# Patient Record
Sex: Male | Born: 1975
Health system: Southern US, Community
[De-identification: ages and names within clinical notes are randomized; demographics above are authoritative.]

## PROBLEM LIST (undated history)

## (undated) DIAGNOSIS — F1010 Alcohol abuse, uncomplicated: Secondary | ICD-10-CM

## (undated) DIAGNOSIS — I10 Essential (primary) hypertension: Secondary | ICD-10-CM

## (undated) DIAGNOSIS — F329 Major depressive disorder, single episode, unspecified: Secondary | ICD-10-CM

## (undated) DIAGNOSIS — F209 Schizophrenia, unspecified: Secondary | ICD-10-CM

## (undated) DIAGNOSIS — R569 Unspecified convulsions: Secondary | ICD-10-CM

## (undated) DIAGNOSIS — G43909 Migraine, unspecified, not intractable, without status migrainosus: Secondary | ICD-10-CM

## (undated) DIAGNOSIS — F32A Depression, unspecified: Secondary | ICD-10-CM

## (undated) HISTORY — DX: Depression, unspecified: F32.A

## (undated) HISTORY — DX: Essential (primary) hypertension: I10

## (undated) HISTORY — DX: Migraine, unspecified, not intractable, without status migrainosus: G43.909

## (undated) HISTORY — PX: WISDOM TOOTH EXTRACTION: SHX21

## (undated) HISTORY — DX: Major depressive disorder, single episode, unspecified: F32.9

## (undated) HISTORY — DX: Alcohol abuse, uncomplicated: F10.10

---

## 1997-08-16 ENCOUNTER — Encounter: Admission: RE | Admit: 1997-08-16 | Discharge: 1997-08-16 | Payer: Self-pay | Admitting: Family Medicine

## 1997-08-31 ENCOUNTER — Encounter: Admission: RE | Admit: 1997-08-31 | Discharge: 1997-08-31 | Payer: Self-pay | Admitting: Family Medicine

## 1997-11-17 ENCOUNTER — Encounter: Admission: RE | Admit: 1997-11-17 | Discharge: 1997-11-17 | Payer: Self-pay | Admitting: Internal Medicine

## 1997-11-21 ENCOUNTER — Encounter: Admission: RE | Admit: 1997-11-21 | Discharge: 1997-11-21 | Payer: Self-pay | Admitting: Family Medicine

## 1999-12-27 ENCOUNTER — Encounter: Admission: RE | Admit: 1999-12-27 | Discharge: 1999-12-27 | Payer: Self-pay | Admitting: Family Medicine

## 2000-10-04 ENCOUNTER — Emergency Department (HOSPITAL_COMMUNITY): Admission: EM | Admit: 2000-10-04 | Discharge: 2000-10-04 | Payer: Self-pay | Admitting: Emergency Medicine

## 2001-01-07 ENCOUNTER — Emergency Department (HOSPITAL_COMMUNITY): Admission: EM | Admit: 2001-01-07 | Discharge: 2001-01-08 | Payer: Self-pay | Admitting: Emergency Medicine

## 2001-01-15 ENCOUNTER — Encounter: Admission: RE | Admit: 2001-01-15 | Discharge: 2001-01-15 | Payer: Self-pay | Admitting: Family Medicine

## 2001-04-29 ENCOUNTER — Encounter: Admission: RE | Admit: 2001-04-29 | Discharge: 2001-04-29 | Payer: Self-pay | Admitting: Family Medicine

## 2001-05-06 ENCOUNTER — Encounter: Admission: RE | Admit: 2001-05-06 | Discharge: 2001-05-06 | Payer: Self-pay | Admitting: Family Medicine

## 2001-07-10 ENCOUNTER — Encounter: Payer: Self-pay | Admitting: *Deleted

## 2001-07-10 ENCOUNTER — Emergency Department (HOSPITAL_COMMUNITY): Admission: EM | Admit: 2001-07-10 | Discharge: 2001-07-10 | Payer: Self-pay | Admitting: *Deleted

## 2003-04-29 ENCOUNTER — Emergency Department (HOSPITAL_COMMUNITY): Admission: AD | Admit: 2003-04-29 | Discharge: 2003-04-29 | Payer: Self-pay | Admitting: Family Medicine

## 2003-05-16 ENCOUNTER — Encounter: Admission: RE | Admit: 2003-05-16 | Discharge: 2003-05-16 | Payer: Self-pay | Admitting: Family Medicine

## 2003-06-03 ENCOUNTER — Emergency Department (HOSPITAL_COMMUNITY): Admission: AD | Admit: 2003-06-03 | Discharge: 2003-06-03 | Payer: Self-pay | Admitting: Family Medicine

## 2003-07-21 ENCOUNTER — Emergency Department (HOSPITAL_COMMUNITY): Admission: EM | Admit: 2003-07-21 | Discharge: 2003-07-21 | Payer: Self-pay | Admitting: Family Medicine

## 2003-08-29 ENCOUNTER — Emergency Department (HOSPITAL_COMMUNITY): Admission: EM | Admit: 2003-08-29 | Discharge: 2003-08-29 | Payer: Self-pay | Admitting: Family Medicine

## 2004-11-02 ENCOUNTER — Emergency Department (HOSPITAL_COMMUNITY): Admission: EM | Admit: 2004-11-02 | Discharge: 2004-11-02 | Payer: Self-pay | Admitting: Emergency Medicine

## 2005-07-09 ENCOUNTER — Emergency Department (HOSPITAL_COMMUNITY): Admission: EM | Admit: 2005-07-09 | Discharge: 2005-07-09 | Payer: Self-pay | Admitting: Family Medicine

## 2006-05-29 DIAGNOSIS — F17200 Nicotine dependence, unspecified, uncomplicated: Secondary | ICD-10-CM | POA: Insufficient documentation

## 2006-05-29 DIAGNOSIS — F23 Brief psychotic disorder: Secondary | ICD-10-CM | POA: Insufficient documentation

## 2011-09-27 ENCOUNTER — Encounter (HOSPITAL_COMMUNITY): Payer: Self-pay | Admitting: Emergency Medicine

## 2011-09-27 DIAGNOSIS — F17200 Nicotine dependence, unspecified, uncomplicated: Secondary | ICD-10-CM | POA: Insufficient documentation

## 2011-09-27 DIAGNOSIS — K648 Other hemorrhoids: Secondary | ICD-10-CM | POA: Insufficient documentation

## 2011-09-27 DIAGNOSIS — K922 Gastrointestinal hemorrhage, unspecified: Secondary | ICD-10-CM | POA: Insufficient documentation

## 2011-09-27 LAB — CBC WITH DIFFERENTIAL/PLATELET
Basophils Absolute: 0 10*3/uL (ref 0.0–0.1)
Basophils Relative: 0 % (ref 0–1)
Eosinophils Absolute: 0.1 10*3/uL (ref 0.0–0.7)
Eosinophils Relative: 1 % (ref 0–5)
HCT: 45.2 % (ref 39.0–52.0)
Hemoglobin: 15.7 g/dL (ref 13.0–17.0)
Lymphocytes Relative: 49 % — ABNORMAL HIGH (ref 12–46)
Lymphs Abs: 3.6 10*3/uL (ref 0.7–4.0)
MCH: 29.8 pg (ref 26.0–34.0)
MCHC: 34.7 g/dL (ref 30.0–36.0)
MCV: 85.8 fL (ref 78.0–100.0)
Monocytes Absolute: 0.7 10*3/uL (ref 0.1–1.0)
Monocytes Relative: 9 % (ref 3–12)
Neutro Abs: 3 10*3/uL (ref 1.7–7.7)
Neutrophils Relative %: 41 % — ABNORMAL LOW (ref 43–77)
Platelets: 219 10*3/uL (ref 150–400)
RBC: 5.27 MIL/uL (ref 4.22–5.81)
RDW: 12.4 % (ref 11.5–15.5)
WBC: 7.3 10*3/uL (ref 4.0–10.5)

## 2011-09-27 LAB — BASIC METABOLIC PANEL
BUN: 8 mg/dL (ref 6–23)
CO2: 29 mEq/L (ref 19–32)
Calcium: 9.7 mg/dL (ref 8.4–10.5)
Chloride: 100 mEq/L (ref 96–112)
Creatinine, Ser: 1.15 mg/dL (ref 0.50–1.35)
GFR calc Af Amer: >90 mL/min (ref >90)
GFR calc non Af Amer: 80 mL/min — ABNORMAL LOW (ref >90)
Glucose, Bld: 101 mg/dL — ABNORMAL HIGH (ref 70–99)
Potassium: 3.8 mEq/L (ref 3.5–5.1)
Sodium: 139 mEq/L (ref 135–145)

## 2011-09-27 LAB — SAMPLE TO BLOOD BANK

## 2011-09-27 NOTE — ED Notes (Signed)
Updated in w/r 

## 2011-09-27 NOTE — ED Notes (Signed)
PT. REPORTS BLOODY STOOLS FOR 4 WEEKS , DENIES INJURY , NO NAUSEA OR VOMITTING .

## 2011-09-28 ENCOUNTER — Emergency Department (HOSPITAL_COMMUNITY)
Admission: EM | Admit: 2011-09-28 | Discharge: 2011-09-28 | Disposition: A | Discharge status: Home / Self Care | Payer: Medicaid Other | Attending: Emergency Medicine | Admitting: Emergency Medicine

## 2011-09-28 ENCOUNTER — Emergency Department (HOSPITAL_COMMUNITY): Payer: Medicaid Other

## 2011-09-28 DIAGNOSIS — K922 Gastrointestinal hemorrhage, unspecified: Secondary | ICD-10-CM

## 2011-09-28 MED ORDER — DOCUSATE SODIUM 100 MG PO CAPS: 100.0000 mg | ORAL_CAPSULE | Freq: Two times a day (BID) | ORAL | Status: AC

## 2011-09-28 NOTE — Discharge Instructions (Signed)
Gastrointestinal Bleeding Gastrointestinal (GI) bleeding is bleeding from the gut or any place between your mouth and anus. If bleeding is slow, you may be allowed to go home. If there is a lot of bleeding, hospitalization and observation are often required. SYMPTOMS   You vomit bright red blood or material that looks like coffee grounds.   You have blood in your stools or the stools look black and tarry.  DIAGNOSIS  Your caregiver may diagnose your condition by taking a history and a physical exam. More tests may be needed, including:  X-rays.   EGD (esophagogastroduodenoscopy), which looks at your esophagus, stomach, and small bowel through a flexible telescope-like instrument.   Colonoscopy, which looks at your colon/large bowel through a flexible telescope-like instrument.   Biopsies, which remove a small sample of tissue to examine under a microscope.  Finding out the results of your test Not all test results are available during your visit. If your test results are not back during the visit, make an appointment with your caregiver to find out the results. Do not assume everything is normal if you have not heard from your caregiver or the medical facility. It is important for you to follow up on all of your test results. HOME CARE INSTRUCTIONS   Follow instructions as suggested by your caregiver regarding medicines. Do not take aspirin, drink alcohol, or take medicines for pain and arthritis unless your caregiver says it is okay.   Get the suggested follow-up care when the tests are done.  SEEK IMMEDIATE MEDICAL CARE IF:   Your bleeding increases or you become lightheaded, weak, or pass out (faint).   You experience severe cramps in your stomach, back, or belly (abdomen).   You pass large clots.   The problems which brought you in for medical care get worse.  MAKE SURE YOU:   Understand these instructions.   Will watch your condition.   Will get help right away if you are  not doing well or get worse.  Document Released: 03/15/2000 Document Revised: 03/07/2011 Document Reviewed: 02/25/2011 [employer] Patient Information 2012 [employer]. 

## 2011-09-28 NOTE — ED Provider Notes (Signed)
History     CSN: [id number]  Arrival date & time 09/27/11  2047   First MD Initiated Contact with Patient 09/28/11 0114      Chief Complaint  Patient presents with  . Rectal Bleeding    (Consider location/radiation/quality/duration/timing/severity/associated sxs/prior treatment) HPI Pt has noted several weeks of episodic red blood on toilet paper. Tonight states he noticed more blood in toilet. He is concerned he may have swallowed some chicken bones weeks ago. Denies abd pain, vomiting, fever.  History reviewed. No pertinent past medical history.  History reviewed. No pertinent past surgical history.  No family history on file.  History  Substance Use Topics  . Smoking status: Current Everyday Smoker  . Smokeless tobacco: Not on file  . Alcohol Use: Yes      Review of Systems  Constitutional: Negative for fever and chills.  Respiratory: Negative for shortness of breath.   Cardiovascular: Negative for chest pain.  Gastrointestinal: Positive for blood in stool and anal bleeding. Negative for nausea, vomiting, abdominal pain, diarrhea and rectal pain.  Musculoskeletal: Negative for back pain.  Skin: Negative for rash and wound.  Neurological: Negative for dizziness, weakness, light-headedness and numbness.    Allergies  Review of patient's allergies indicates no known allergies.  Home Medications   Current Outpatient Rx  Name Route Sig Dispense Refill  . DIVALPROEX SODIUM ER 500 MG PO TB24 Oral Take 1,000 mg by mouth daily.    Marland Kitchen RISPERIDONE 2 MG PO TABS Oral Take 2 mg by mouth 2 (two) times daily. 1 tablet every morning, and 3 at bedtime    . DOCUSATE SODIUM 100 MG PO CAPS Oral Take 1 capsule (100 mg total) by mouth every 12 (twelve) hours. 60 capsule 0    BP 133/80  Pulse 81  Temp 98.5 F (36.9 C) (Oral)  Resp 20  SpO2 100%  Physical Exam  Nursing note and vitals reviewed. Constitutional: He is oriented to person, place, and time. He appears  well-developed and well-nourished. No distress.  HENT:  Head: Normocephalic and atraumatic.  Mouth/Throat: Oropharynx is clear and moist.  Eyes: EOM are normal. Pupils are equal, round, and reactive to light.  Neck: Normal range of motion. Neck supple.  Cardiovascular: Normal rate and regular rhythm.   Pulmonary/Chest: Effort normal and breath sounds normal. No respiratory distress. He has no wheezes. He has no rales.  Abdominal: Soft. Bowel sounds are normal. He exhibits no distension and no mass. There is no tenderness. There is no rebound and no guarding.  Genitourinary: Guaiac positive stool.       + enlarged internal hemorrhoids  Musculoskeletal: Normal range of motion. He exhibits no edema and no tenderness.  Neurological: He is alert and oriented to person, place, and time.  Skin: Skin is warm and dry. No rash noted. No erythema.  Psychiatric: He has a normal mood and affect. His behavior is normal.    ED Course  Procedures (including critical care time)  Labs Reviewed  CBC WITH DIFFERENTIAL - Abnormal; Notable for the following:    Neutrophils Relative 41 (*)     Lymphocytes Relative 49 (*)     All other components within normal limits  BASIC METABOLIC PANEL - Abnormal; Notable for the following:    Glucose, Bld 101 (*)     GFR calc non Af Amer 80 (*)     All other components within normal limits  SAMPLE TO BLOOD BANK  OCCULT BLOOD, POC DEVICE  OCCULT BLOOD X 1  CARD TO LAB, STOOL   Dg Abd Acute W/chest  09/28/2011  *RADIOLOGY REPORT*  Clinical Data: Questionable foreign body ingestion.  ACUTE ABDOMEN SERIES (ABDOMEN 2 VIEW & CHEST 1 VIEW)  Comparison: 11/02/2004 chest radiograph  Findings: Mild increased interstitial markings may reflect chronic / smoking change.  No focal consolidation, pleural effusion, or pneumothorax.  Cardiomediastinal contours are within normal range.  Organ outlines normal where seen.  Bowel gas pattern is nonobstructive.  No free intraperitoneal air.   No acute osseous finding. Round calcific densities projecting over the pelvis presumably represent phlebolith.  IMPRESSION: Nonobstructive bowel gas pattern.  No radiopaque foreign body identified  Original Report Authenticated By: Waneta Martins, M.D.     1. Lower GI bleeding       MDM  Likely source of bleeding is internal hemorrhoids. F/u with GI or ED if worsens        Loren Racer, MD 09/28/11 [phone]

## 2011-10-16 ENCOUNTER — Emergency Department (HOSPITAL_COMMUNITY)
Admission: EM | Admit: 2011-10-16 | Discharge: 2011-10-16 | Disposition: A | Discharge status: Home / Self Care | Payer: Medicare Other | Attending: Emergency Medicine | Admitting: Emergency Medicine

## 2011-10-16 ENCOUNTER — Encounter (HOSPITAL_COMMUNITY): Payer: Self-pay | Admitting: *Deleted

## 2011-10-16 ENCOUNTER — Encounter: Payer: Self-pay | Admitting: Gastroenterology

## 2011-10-16 DIAGNOSIS — F17200 Nicotine dependence, unspecified, uncomplicated: Secondary | ICD-10-CM | POA: Insufficient documentation

## 2011-10-16 DIAGNOSIS — F209 Schizophrenia, unspecified: Secondary | ICD-10-CM | POA: Insufficient documentation

## 2011-10-16 DIAGNOSIS — K921 Melena: Secondary | ICD-10-CM | POA: Insufficient documentation

## 2011-10-16 DIAGNOSIS — R109 Unspecified abdominal pain: Secondary | ICD-10-CM

## 2011-10-16 HISTORY — DX: Schizophrenia, unspecified: F20.9

## 2011-10-16 HISTORY — DX: Unspecified convulsions: R56.9

## 2011-10-16 LAB — CBC WITH DIFFERENTIAL/PLATELET
Eosinophils Absolute: 0.1 10*3/uL (ref 0.0–0.7)
Eosinophils Relative: 1 % (ref 0–5)
Hemoglobin: 15.6 g/dL (ref 13.0–17.0)
Lymphocytes Relative: 31 % (ref 12–46)
Lymphs Abs: 2.7 10*3/uL (ref 0.7–4.0)
MCH: 28.8 pg (ref 26.0–34.0)
MCV: 86.3 fL (ref 78.0–100.0)
Monocytes Relative: 11 % (ref 3–12)
Neutrophils Relative %: 58 % (ref 43–77)
Platelets: 235 10*3/uL (ref 150–400)
RBC: 5.42 MIL/uL (ref 4.22–5.81)
WBC: 8.7 10*3/uL (ref 4.0–10.5)

## 2011-10-16 LAB — POCT I-STAT, CHEM 8
BUN: 4 mg/dL — ABNORMAL LOW (ref 6–23)
Creatinine, Ser: 1.2 mg/dL (ref 0.50–1.35)
Glucose, Bld: 97 mg/dL (ref 70–99)
Hemoglobin: 16.7 g/dL (ref 13.0–17.0)
Potassium: 3.6 mEq/L (ref 3.5–5.1)
Sodium: 142 mEq/L (ref 135–145)

## 2011-10-16 NOTE — ED Provider Notes (Signed)
Medical screening examination/treatment/procedure(s) were performed by non-physician practitioner and as supervising physician I was immediately available for consultation/collaboration.   Lyanne Co, MD 10/16/11 2126

## 2011-10-16 NOTE — ED Notes (Signed)
Friend at bedside.

## 2011-10-16 NOTE — ED Provider Notes (Signed)
History     CSN: [id number]  Arrival date & time 10/16/11  0909   First MD Initiated Contact with Patient 10/16/11 [phone]      Chief Complaint  Patient presents with  . Abdominal Pain    (Consider location/radiation/quality/duration/timing/severity/associated sxs/prior treatment) HPI  36 year old male presents complaining of dark emesis. Patient reports to swallow a chicken bone a month ago. Denies any significant pain in while swallowing but does acknowledge that he did swallowed it.  Subsequently he has been noticing a mild discomfort in his abdomen. States "I feel as if my stomach is coming up".  Patient is reports blood in his stools for the past several weeks. Describe as dark blood mixed with stool. Does endorse intermittent rectal pain with bowel movement. Was seen in the ED 3 weeks ago for same and was diagnosed with possible internal hemorrhoid. Was told to return to ED of upper GI symptoms worsen. He is here for recheck because he has consult with GI and is scheduled to be seen in 5 days. However, this morning he noticed some stomach discomfort, and proceeds to vomit up some dark emesis. Denies any significant pain associated with it. Denies fever, headache, lightheadedness, dizziness, chest pain, shortness of breath, cough, back pain, urinary symptoms. Continues to endorse having stool with dark blood mixed in it.     Past Medical History  Diagnosis Date  . Schizophrenia   . Seizure     History reviewed. No pertinent past surgical history.  No family history on file.  History  Substance Use Topics  . Smoking status: Current Everyday Smoker  . Smokeless tobacco: Not on file  . Alcohol Use: Yes      Review of Systems  Constitutional: Negative for fever, fatigue and unexpected weight change.  HENT: Negative for sore throat and trouble swallowing.   Gastrointestinal: Positive for vomiting, blood in stool and rectal pain.  Skin: Negative for rash and wound.    Neurological: Negative for speech difficulty.  All other systems reviewed and are negative.    Allergies  Review of patient's allergies indicates no known allergies.  Home Medications   Current Outpatient Rx  Name Route Sig Dispense Refill  . DIVALPROEX SODIUM ER 500 MG PO TB24 Oral Take 1,000 mg by mouth daily.    Marland Kitchen RISPERIDONE 2 MG PO TABS Oral Take 2 mg by mouth 2 (two) times daily. 1 tablet every morning, and 3 at bedtime      BP 135/77  Pulse 100  Temp 98.6 F (37 C) (Oral)  Resp 20  Ht 6\' 4"  (1.93 m)  Wt 220 lb (99.791 kg)  BMI 26.78 kg/m2  SpO2 96%  Physical Exam  Nursing note and vitals reviewed. Constitutional: He appears well-developed and well-nourished. No distress.       Awake, alert, nontoxic appearance  HENT:  Head: Atraumatic.  Mouth/Throat: Oropharynx is clear and moist. No oropharyngeal exudate.  Eyes: Conjunctivae are normal. Right eye exhibits no discharge. Left eye exhibits no discharge.  Neck: Normal range of motion. Neck supple.  Cardiovascular: Normal rate and regular rhythm.   Pulmonary/Chest: Effort normal. No respiratory distress. He exhibits no tenderness.  Abdominal: Soft. There is no tenderness. There is no rebound.  Genitourinary: Rectum normal and prostate normal.  Musculoskeletal: He exhibits no tenderness.       ROM appears intact, no obvious focal weakness  Neurological: He is alert.  Skin: Skin is warm and dry. No rash noted.  Psychiatric: He has a normal  mood and affect.    ED Course  Procedures (including critical care time)   Labs Reviewed  CBC WITH DIFFERENTIAL   No results found.   No diagnosis found.  Results for orders placed during the hospital encounter of 10/16/11  CBC WITH DIFFERENTIAL      Component Value Range   WBC 8.7  4.0 - 10.5 K/uL   RBC 5.42  4.22 - 5.81 MIL/uL   Hemoglobin 15.6  13.0 - 17.0 g/dL   HCT 91.4  78.2 - 95.6 %   MCV 86.3  78.0 - 100.0 fL   MCH 28.8  26.0 - 34.0 pg   MCHC 33.3  30.0  - 36.0 g/dL   RDW 21.3  08.6 - 57.8 %   Platelets 235  150 - 400 K/uL   Neutrophils Relative 58  43 - 77 %   Neutro Abs 5.0  1.7 - 7.7 K/uL   Lymphocytes Relative 31  12 - 46 %   Lymphs Abs 2.7  0.7 - 4.0 K/uL   Monocytes Relative 11  3 - 12 %   Monocytes Absolute 0.9  0.1 - 1.0 K/uL   Eosinophils Relative 1  0 - 5 %   Eosinophils Absolute 0.1  0.0 - 0.7 K/uL   Basophils Relative 0  0 - 1 %   Basophils Absolute 0.0  0.0 - 0.1 K/uL  POCT I-STAT, CHEM 8      Component Value Range   Sodium 142  135 - 145 mEq/L   Potassium 3.6  3.5 - 5.1 mEq/L   Chloride 106  96 - 112 mEq/L   BUN 4 (*) 6 - 23 mg/dL   Creatinine, Ser 4.69  0.50 - 1.35 mg/dL   Glucose, Bld 97  70 - 99 mg/dL   Calcium, Ion 6.29  5.28 - 1.23 mmol/L   TCO2 24  0 - 100 mmol/L   Hemoglobin 16.7  13.0 - 17.0 g/dL   HCT 41.3  24.4 - 01.0 %  OCCULT BLOOD, POC DEVICE      Component Value Range   Fecal Occult Bld NEGATIVE     Dg Abd Acute W/chest  09/28/2011  *RADIOLOGY REPORT*  Clinical Data: Questionable foreign body ingestion.  ACUTE ABDOMEN SERIES (ABDOMEN 2 VIEW & CHEST 1 VIEW)  Comparison: 11/02/2004 chest radiograph  Findings: Mild increased interstitial markings may reflect chronic / smoking change.  No focal consolidation, pleural effusion, or pneumothorax.  Cardiomediastinal contours are within normal range.  Organ outlines normal where seen.  Bowel gas pattern is nonobstructive.  No free intraperitoneal air.  No acute osseous finding. Round calcific densities projecting over the pelvis presumably represent phlebolith.  IMPRESSION: Nonobstructive bowel gas pattern.  No radiopaque foreign body identified  Original Report Authenticated By: Waneta Martins, M.D.       MDM  C/o dark emesis today and intermittent dark blood in stool for nearly a month since swallowing chicken bone.  Pt currently in NAD.  Abd nontender, nonsurgical. Rectal exam unremarkable.    10:13 AM Hemoccults negative, normal hemoglobin with  normal electrolytes. Patient has stable normal vital signs. Plan to have patient followup with GI for further management. Reassurance given.      Fayrene Helper, PA-C 10/16/11 1101

## 2011-10-16 NOTE — ED Notes (Signed)
Patient is here for recheck.  He reports he had emesis today and noted brown coloring.  Patient recently had blood in his stool and was advised to return if he noted any new s/sx of bleeding.  Patient reports he has intermittent rectal pain with bm

## 2011-10-22 ENCOUNTER — Encounter: Payer: Self-pay | Admitting: Gastroenterology

## 2011-10-22 ENCOUNTER — Ambulatory Visit (INDEPENDENT_AMBULATORY_CARE_PROVIDER_SITE_OTHER): Payer: Medicaid Other | Admitting: Gastroenterology

## 2011-10-22 VITALS — BP 120/76 | HR 80 | Ht 76.0 in | Wt 212.4 lb

## 2011-10-22 DIAGNOSIS — R634 Abnormal weight loss: Secondary | ICD-10-CM

## 2011-10-22 DIAGNOSIS — K921 Melena: Secondary | ICD-10-CM

## 2011-10-22 DIAGNOSIS — R630 Anorexia: Secondary | ICD-10-CM

## 2011-10-22 DIAGNOSIS — R1032 Left lower quadrant pain: Secondary | ICD-10-CM

## 2011-10-22 MED ORDER — MOVIPREP 100 G PO SOLR: 1.0000 | Freq: Once | ORAL | Status: DC

## 2011-10-22 NOTE — Patient Instructions (Addendum)
You have been scheduled for an endoscopy and colonoscopy with propofol. Please follow the written instructions given to you at your visit today. Please pick up your prep at the pharmacy within the next 1-3 days. If you use inhalers (even only as needed), please bring them with you on the day of your procedure. Start over the counter Miralax mixing 17 grams in 8 oz of water daily for constipation.

## 2011-10-22 NOTE — Progress Notes (Signed)
History of Present Illness: This is a 36 year old male who relates a 2 to 3 month history of intermittent, small-volume bright red and dark red blood per rectum associated with bowel movements. He has had constipation, left lower quadrant pain and rectal pressure associated with defecation. He has also noted anorexia and a 20-30 pound weight loss over 2-3 months. He had one episode of vomiting. He was seen in the emergency department for his rectal bleeding-internal hemorrhoids and Hemoccult-positive stool were noted. He was also seen in the emergency department for vomiting. I reviewed records from both emergency room visits. He states he swallowed a chicken bone several weeks ago prior to the onset of his gastrointestinal symptoms. Denies weight loss, abdominal pain, constipation, diarrhea, change in stool caliber, melena, hematochezia, nausea, vomiting, dysphagia, reflux symptoms, chest pain.  Review of Systems: Pertinent positive and negative review of systems were noted in the above HPI section. All other review of systems were otherwise negative.  Current Medications, Allergies, Past Medical History, Past Surgical History, Family History and Social History were reviewed in [employer] record.  Physical Exam: General: Well developed , well nourished, no acute distress Head: Normocephalic and atraumatic Eyes:  sclerae anicteric, EOMI Ears: Normal auditory acuity Mouth: No deformity or lesions Neck: Supple, no masses or thyromegaly Lungs: Clear throughout to auscultation Heart: Regular rate and rhythm; no murmurs, rubs or bruits Abdomen: Soft, non tender and non distended. No masses, hepatosplenomegaly or hernias noted. Normal Bowel sounds Rectal: Deferred to colonoscopy Musculoskeletal: Symmetrical with no gross deformities  Skin: No lesions on visible extremities Pulses:  Normal pulses noted Extremities: No clubbing, cyanosis, edema or deformities noted Neurological:  Alert oriented x 4, grossly nonfocal Cervical Nodes:  No significant cervical adenopathy Inguinal Nodes: No significant inguinal adenopathy Psychological:  Alert and cooperative. Normal mood and affect  Assessment and Recommendations:  1. Hematochezia, Hemoccult-positive stool, self-reported weight loss, left lower quadrant pain, anorexia and one episode of vomiting. Rule out ulcer disease, colorectal neoplasms and other disorders. I've advised him to begin MiraLax once daily along with a high fiber diet and increase water intake. Schedule colonoscopy and upper endoscopy. The risks, benefits, and alternatives to colonoscopy with possible biopsy and possible polypectomy were discussed with the patient and they consent to proceed. The risks, benefits, and alternatives to endoscopy with possible biopsy and possible dilation were discussed with the patient and they consent to proceed.

## 2011-10-23 ENCOUNTER — Emergency Department (HOSPITAL_COMMUNITY)
Admission: EM | Admit: 2011-10-23 | Discharge: 2011-10-24 | Disposition: A | Discharge status: Home / Self Care | Payer: Medicare Other | Attending: Emergency Medicine | Admitting: Emergency Medicine

## 2011-10-23 ENCOUNTER — Encounter (HOSPITAL_COMMUNITY): Payer: Self-pay | Admitting: Emergency Medicine

## 2011-10-23 ENCOUNTER — Encounter: Payer: Medicaid Other | Admitting: Gastroenterology

## 2011-10-23 ENCOUNTER — Other Ambulatory Visit: Payer: Self-pay | Admitting: Gastroenterology

## 2011-10-23 DIAGNOSIS — F17200 Nicotine dependence, unspecified, uncomplicated: Secondary | ICD-10-CM | POA: Insufficient documentation

## 2011-10-23 DIAGNOSIS — R630 Anorexia: Secondary | ICD-10-CM

## 2011-10-23 DIAGNOSIS — F209 Schizophrenia, unspecified: Secondary | ICD-10-CM | POA: Insufficient documentation

## 2011-10-23 DIAGNOSIS — R634 Abnormal weight loss: Secondary | ICD-10-CM

## 2011-10-23 DIAGNOSIS — F1020 Alcohol dependence, uncomplicated: Secondary | ICD-10-CM | POA: Insufficient documentation

## 2011-10-23 DIAGNOSIS — R55 Syncope and collapse: Secondary | ICD-10-CM

## 2011-10-23 DIAGNOSIS — I10 Essential (primary) hypertension: Secondary | ICD-10-CM | POA: Insufficient documentation

## 2011-10-23 DIAGNOSIS — K921 Melena: Secondary | ICD-10-CM

## 2011-10-23 DIAGNOSIS — R1032 Left lower quadrant pain: Secondary | ICD-10-CM

## 2011-10-23 LAB — COMPREHENSIVE METABOLIC PANEL
ALT: 11 U/L (ref 0–53)
Alkaline Phosphatase: 58 U/L (ref 39–117)
BUN: 5 mg/dL — ABNORMAL LOW (ref 6–23)
CO2: 25 mEq/L (ref 19–32)
Chloride: 104 mEq/L (ref 96–112)
GFR calc Af Amer: >90 mL/min (ref >90)
GFR calc non Af Amer: 79 mL/min — ABNORMAL LOW (ref >90)
Glucose, Bld: 94 mg/dL (ref 70–99)
Potassium: 3.7 mEq/L (ref 3.5–5.1)
Sodium: 139 mEq/L (ref 135–145)
Total Bilirubin: 0.6 mg/dL (ref 0.3–1.2)
Total Protein: 6.8 g/dL (ref 6.0–8.3)

## 2011-10-23 LAB — CBC
HCT: 43.7 % (ref 39.0–52.0)
Hemoglobin: 14.9 g/dL (ref 13.0–17.0)
RBC: 5.13 MIL/uL (ref 4.22–5.81)
WBC: 7.7 10*3/uL (ref 4.0–10.5)

## 2011-10-23 LAB — GLUCOSE, CAPILLARY: Glucose-Capillary: 91 mg/dL (ref 70–99)

## 2011-10-23 MED ORDER — MOVIPREP 100 G PO SOLR: 1.0000 | Freq: Once | ORAL | Status: DC

## 2011-10-23 MED ORDER — SODIUM CHLORIDE 0.9 % IV BOLUS (SEPSIS)
1000.0000 mL | Freq: Once | INTRAVENOUS | Status: AC
  Administered 2011-10-23: 1000 mL via INTRAVENOUS

## 2011-10-23 NOTE — ED Notes (Signed)
Pt alert, arrives from home, c/o ? Syncope, onset this afternoon, pt states he was sitting on car hood, "passed out", pt recently fasting for endoscopy, resp even unlabored, skin pwd, ambulates to triage, steady gait noted

## 2011-10-23 NOTE — ED Notes (Signed)
Report given to [facility] EMT due to pt assignment change

## 2011-10-23 NOTE — ED Notes (Signed)
EKG given to EDP, Linker,MD. 

## 2011-10-23 NOTE — ED Notes (Signed)
Pt. CBG 91, Notified RN, Wells.

## 2011-10-23 NOTE — ED Provider Notes (Signed)
History     CSN: [id number]  Arrival date & time 10/23/11  2018   First MD Initiated Contact with Patient 10/23/11 2307      Chief Complaint  Patient presents with  . Near-Syncope     (Consider location/radiation/quality/duration/timing/severity/associated sxs/prior treatment) HPI This is a 36 year old black male who was to have an endoscopy today and went to bowel prep for this. He was taking when he drank something just before the procedure and so the procedure was postponed. This evening he was sitting on the of his car and became so weak he fell to the ground. He states he did not lose consciousness and remembers the event. He states he did not have a seizure although this did feel like a seizure asked him. He states he feels back to his baseline now. He states he has not eaten anything today. He denies being overheated. He denies nausea, vomiting or profuse sweating.  Past Medical History  Diagnosis Date  . Schizophrenia   . Seizure     alcoho  . Alcohol abuse   . Hypertension     Past Surgical History  Procedure Date  . Wisdom tooth extraction     Family History  Problem Relation Age of Onset  . Diabetes Mother   . Colon polyps Maternal Uncle   . Heart disease Maternal Uncle   . Celiac disease Maternal Grandmother   . Diabetes Maternal Grandmother   . Diabetes Maternal Grandfather     History  Substance Use Topics  . Smoking status: Current Everyday Smoker  . Smokeless tobacco: Never Used  . Alcohol Use: Yes      Review of Systems  All other systems reviewed and are negative.    Allergies  Review of patient's allergies indicates no known allergies.  Home Medications   Current Outpatient Rx  Name Route Sig Dispense Refill  . B COMPLEX PO TABS Oral Take 1 tablet by mouth daily.    Marland Kitchen DIVALPROEX SODIUM ER 500 MG PO TB24 Oral Take 1,000 mg by mouth every evening.     . NON FORMULARY  Androzene Dietary Supplement 336mg  once daily    . POLYETHYLENE  GLYCOL 3350 PO PACK Oral Take 17 g by mouth daily.    Marland Kitchen RISPERIDONE 2 MG PO TABS Oral Take 2 mg by mouth 2 (two) times daily. 1 tablet every morning, and 3 at bedtime      BP 129/77  Pulse 89  Temp 98.9 F (37.2 C) (Oral)  Resp 16  SpO2 100%  Physical Exam General: Well-developed, well-nourished male in no acute distress; appearance consistent with age of record HENT: normocephalic, atraumatic Eyes: pupils equal round and reactive to light; extraocular muscles intact Neck: supple Heart: regular rate and rhythm Lungs: clear to auscultation bilaterally Abdomen: soft; nondistended Extremities: No deformity; full range of motion; pulses normal Neurologic: Awake, alert and oriented; motor function intact in all extremities and symmetric; no facial droop Skin: Warm and dry Psychiatric: Normal mood and affect    ED Course  Procedures (including critical care time)     MDM   Nursing notes and vitals signs, including pulse oximetry, reviewed.  Summary of this visit's results, reviewed by myself:  Labs:  Results for orders placed during the hospital encounter of 10/23/11  GLUCOSE, CAPILLARY      Component Value Range   Glucose-Capillary 91  70 - 99 mg/dL   Comment 1 Notify RN    CBC      Component Value  Range   WBC 7.7  4.0 - 10.5 K/uL   RBC 5.13  4.22 - 5.81 MIL/uL   Hemoglobin 14.9  13.0 - 17.0 g/dL   HCT 60.4  54.0 - 98.1 %   MCV 85.2  78.0 - 100.0 fL   MCH 29.0  26.0 - 34.0 pg   MCHC 34.1  30.0 - 36.0 g/dL   RDW 19.1  47.8 - 29.5 %   Platelets 219  150 - 400 K/uL  COMPREHENSIVE METABOLIC PANEL      Component Value Range   Sodium 139  135 - 145 mEq/L   Potassium 3.7  3.5 - 5.1 mEq/L   Chloride 104  96 - 112 mEq/L   CO2 25  19 - 32 mEq/L   Glucose, Bld 94  70 - 99 mg/dL   BUN 5 (*) 6 - 23 mg/dL   Creatinine, Ser 6.21  0.50 - 1.35 mg/dL   Calcium 9.3  8.4 - 30.8 mg/dL   Total Protein 6.8  6.0 - 8.3 g/dL   Albumin 3.9  3.5 - 5.2 g/dL   AST 16  0 - 37 U/L    ALT 11  0 - 53 U/L   Alkaline Phosphatase 58  39 - 117 U/L   Total Bilirubin 0.6  0.3 - 1.2 mg/dL   GFR calc non Af Amer 79 (*) >90 mL/min   GFR calc Af Amer >90  >90 mL/min   EKG Interpretation:  Date & Time: 10/23/2011 9:10 PM  Rate: 79  Rhythm: normal sinus rhythm  QRS Axis: normal  Intervals: normal  ST/T Wave abnormalities: early repolarization  Conduction Disutrbances:none  Narrative Interpretation:   Old EKG Reviewed: none available      1:32 AM Feels better after IV fluid bolus.         Hanley Seamen, MD 10/24/11 [phone]

## 2011-10-24 LAB — VALPROIC ACID LEVEL: Valproic Acid Lvl: <10 ug/mL — ABNORMAL LOW (ref 50.0–100.0)

## 2011-10-24 NOTE — ED Notes (Signed)
This scriber received a call  to f/u on labs and notify patient @ [phone]. Lab resulted abnormal (Low). Attempt made to contact patient : Voice mail message left for patient to return call.

## 2011-10-24 NOTE — ED Notes (Signed)
Called flow manager and left a message to call pt if valporic acid level comes back abnormal.  Pt will be discharged.

## 2011-11-27 ENCOUNTER — Telehealth: Payer: Self-pay | Admitting: Gastroenterology

## 2011-11-27 DIAGNOSIS — R634 Abnormal weight loss: Secondary | ICD-10-CM

## 2011-11-27 DIAGNOSIS — K625 Hemorrhage of anus and rectum: Secondary | ICD-10-CM

## 2011-11-27 MED ORDER — MOVIPREP 100 G PO SOLR: 1.0000 | Freq: Once | ORAL | Status: DC

## 2011-11-27 NOTE — Telephone Encounter (Signed)
Pt phoned in and stated that he had lost his prep instructions and is scheduled for ECL tomorrow at 1:30. Pt has been eating today and not following any of the instructions for the past 5 days. Spoke with Dr. Russella Dar and Marchelle Folks regarding continuing with procedure tomorrow. Per Dr. Russella Dar he wants patient to keep the appt. Advised patient to stop eating now. Sent prep orders to pharmacy and instructed patient to go now to pick up prep. Mix and refrigerate. Start drinking prep at 5pm, from fill line to fill line every 15 minutes. Should finish 1 bottle in 1 hour. Wait 15 minutes drink an additional 16 oz fluid. Continue drinking clear liquids through evening at night. Mix 2nd prep before bedtime and refrigerate. At 8:30 am pt needs to start 2nd prep as above and finish by 9:30 followed 15 minutes later with and additional 16 ounces of fluid. May drink fluids up until 10:30 am then nothing by mouth after 10:30a. May take medications as usual. Encouraged him to call if he has further questions or concerns.

## 2011-11-28 ENCOUNTER — Ambulatory Visit (AMBULATORY_SURGERY_CENTER): Payer: Medicaid Other | Admitting: Gastroenterology

## 2011-11-28 ENCOUNTER — Encounter: Payer: Self-pay | Admitting: Gastroenterology

## 2011-11-28 VITALS — BP 127/77 | HR 73 | Temp 97.5°F | Resp 14 | Ht 76.0 in | Wt 212.0 lb

## 2011-11-28 DIAGNOSIS — A048 Other specified bacterial intestinal infections: Secondary | ICD-10-CM

## 2011-11-28 DIAGNOSIS — R634 Abnormal weight loss: Secondary | ICD-10-CM

## 2011-11-28 DIAGNOSIS — K921 Melena: Secondary | ICD-10-CM

## 2011-11-28 DIAGNOSIS — R195 Other fecal abnormalities: Secondary | ICD-10-CM

## 2011-11-28 DIAGNOSIS — R1032 Left lower quadrant pain: Secondary | ICD-10-CM

## 2011-11-28 DIAGNOSIS — R1110 Vomiting, unspecified: Secondary | ICD-10-CM

## 2011-11-28 DIAGNOSIS — K2990 Gastroduodenitis, unspecified, without bleeding: Secondary | ICD-10-CM

## 2011-11-28 DIAGNOSIS — K2970 Gastritis, unspecified, without bleeding: Secondary | ICD-10-CM

## 2011-11-28 MED ORDER — SODIUM CHLORIDE 0.9 % IV SOLN: 500.0000 mL | INTRAVENOUS | Status: DC

## 2011-11-28 NOTE — Progress Notes (Signed)
Patient did not experience any of the following events: a burn prior to discharge; a fall within the facility; wrong site/side/patient/procedure/implant event; or a hospital transfer or hospital admission upon discharge from the facility. (G8907) Patient did not have preoperative order for IV antibiotic SSI prophylaxis. ([id number])  

## 2011-11-28 NOTE — Patient Instructions (Addendum)

## 2011-11-28 NOTE — Op Note (Signed)
[facility] [address] [place], [address]   ENDOSCOPY PROCEDURE REPORT  PATIENT: Alfred [name], Alfred [name]  MR#: [id number] BIRTHDATE: May 29, 1975 , 36  yrs. old GENDER: Male ENDOSCOPIST: Meryl Dare, MD, [facility] REFERRED BY:     Azalia Bilis, MD [facility] ED unassigned) PROCEDURE DATE:  11/28/2011 PROCEDURE:  EGD w/ biopsy ASA CLASS:     Class II INDICATIONS:  abdominal pain in the lower left quadrant,  weight loss,  heme positive stool, vomiting MEDICATIONS: MAC sedation, administered by CRNA, propofol (Diprivan) 250mg  IV, and There was residual sedation effect present from prior procedure. TOPICAL ANESTHETIC: none DESCRIPTION OF PROCEDURE: After the risks benefits and alternatives of the procedure were thoroughly explained, informed consent was obtained.  The LB GIF-H180 [id number] endoscope was introduced through the mouth and advanced to the second portion of the duodenum. Without limitations.  The instrument was slowly withdrawn as the mucosa was fully examined.   STOMACH: Mild gastritis , patchy and granular eyrythema, was found in the gastric antrum and gastric body.  Multiple biopsies were performed using cold forceps.   The stomach otherwise appeared normal. ESOPHAGUS: The mucosa of the esophagus appeared normal. DUODENUM: The duodenal mucosa showed no abnormalities in the bulb and second portion of the duodenum.  Retroflexed views revealed no abnormalities.   The scope was then withdrawn from the patient and the procedure completed.  COMPLICATIONS: There were no complications.  ENDOSCOPIC IMPRESSION: 1.   Mild gastritis; multiple biopsies   RECOMMENDATIONS: 1.  Await pathology results 2.  Follow-up with your primary MD.    eSigned:  Meryl Dare, MD, [facility] 11/28/2011 2:09 PM

## 2011-11-28 NOTE — Op Note (Addendum)
[facility] [address] [place], [address]   COLONOSCOPY PROCEDURE REPORT  PATIENT: Alfred [name], Alfred [name]  MR#: [id number] BIRTHDATE: 07-14-1975 , 36  yrs. old GENDER: Male ENDOSCOPIST: Meryl Dare, MD, [facility] REFERRED BY:   Azalia Bilis, MD [facility] ED unassigned) PROCEDURE DATE:  11/28/2011 PROCEDURE:   Colonoscopy, diagnostic ASA CLASS:   Class II INDICATIONS: hematochezia, heme-positive stool, constipation, and weight loss. MEDICATIONS: MAC sedation, administered by CRNA and propofol (Diprivan) 250mg  IV DESCRIPTION OF PROCEDURE:   After the risks benefits and alternatives of the procedure were thoroughly explained, informed consent was obtained.  A digital rectal exam revealed no abnormalities of the rectum.   The LB [id number]  endoscope was introduced through the anus and advanced to the terminal ileum which was intubated for a short distance. No adverse events experienced.   The quality of the prep was good, using MoviPrep The instrument was then slowly withdrawn as the colon was fully examined.   COLON FINDINGS: A normal appearing cecum, ileocecal valve, and appendiceal orifice were identified.  The ascending, hepatic flexure, transverse, splenic flexure, descending, sigmoid colon and rectum appeared unremarkable.  No polyps or cancers were seen. The mucosa appeared normal in the terminal ileum.  Retroflexed views revealed moderate internal hemorrhoids. The time to cecum=1 minutes 18 seconds.  Withdrawal time=7 minutes 50 seconds.  The scope was withdrawn and the procedure completed. COMPLICATIONS: There were no complications.  ENDOSCOPIC IMPRESSION: 1.   Normal colon 2.   Normal terminal ileum 3.   Moderate internal hemorrhoids  RECOMMENDATIONS: 1.  Miralax 1-3 times daily, titrate to need and Prep H supp qd as needed 2.  High fiber diet with liberal fluid intake. 3.  Follow-up with your primary MD   eSigned:  Meryl Dare, MD, [facility]  11/28/2011 1:58 PM Revised: 11/28/2011 1:58 PM

## 2011-11-28 NOTE — Progress Notes (Signed)
Propofol per m smith crna, all meds titrated per crna during procedure. See scanned intra procedure report. ewm 

## 2011-11-29 ENCOUNTER — Telehealth: Payer: Self-pay | Admitting: *Deleted

## 2011-11-29 NOTE — Telephone Encounter (Signed)
  Follow up Call-  Call back number 11/28/2011  Post procedure Call Back phone  # [phone]  Permission to leave phone message Yes     Patient questions:  Do you have a fever, pain , or abdominal swelling? no Pain Score  0 *  Have you tolerated food without any problems? yes  Have you been able to return to your normal activities? yes  Do you have any questions about your discharge instructions: Diet   no Medications  no Follow up visit  no  Do you have questions or concerns about your Care? no  Actions: * If pain score is 4 or above: No action needed, pain <4.

## 2011-12-05 ENCOUNTER — Encounter: Payer: Self-pay | Admitting: Gastroenterology

## 2011-12-06 ENCOUNTER — Telehealth: Payer: Self-pay | Admitting: Gastroenterology

## 2011-12-06 ENCOUNTER — Other Ambulatory Visit: Payer: Self-pay

## 2011-12-06 MED ORDER — OMEPRAZOLE 20 MG PO CPDR: DELAYED_RELEASE_CAPSULE | ORAL | Status: DC

## 2011-12-06 MED ORDER — BIS SUBCIT-METRONID-TETRACYC 140-125-125 MG PO CAPS: 3.0000 | ORAL_CAPSULE | Freq: Four times a day (QID) | ORAL | Status: DC

## 2011-12-06 NOTE — Telephone Encounter (Signed)
Patient reports that Pylera is $588.  Medicaid won't cover it.  Please advise an alternative.

## 2011-12-06 NOTE — Telephone Encounter (Signed)
You can't break down Pylera into individual parts because the bismuth is not available.  The rep has no samples. Please advise

## 2011-12-06 NOTE — Telephone Encounter (Signed)
Pepto bismol caplets 1 po bid for 10 days (OTC) Metronidazole 250 mg po bid for 10 days Tetracycline hydrochloride, 250 mg po bid for 10 days PPI bid for 10 days

## 2011-12-06 NOTE — Telephone Encounter (Signed)
Please prescribe the individual parts of Pylera

## 2011-12-09 MED ORDER — METRONIDAZOLE 250 MG PO TABS: 250.0000 mg | ORAL_TABLET | Freq: Two times a day (BID) | ORAL | Status: AC

## 2011-12-09 MED ORDER — TETRACYCLINE HCL 250 MG PO CAPS: 250.0000 mg | ORAL_CAPSULE | Freq: Two times a day (BID) | ORAL | Status: AC

## 2011-12-09 MED ORDER — BISMUTH SUBSALICYLATE 262 MG PO CHEW: 524.0000 mg | CHEWABLE_TABLET | Freq: Two times a day (BID) | ORAL | Status: AC

## 2011-12-09 NOTE — Telephone Encounter (Signed)
Patient's mother has been advised to pick up all 4 rx and take each 2 times a day for 10 days.

## 2012-03-21 ENCOUNTER — Emergency Department (HOSPITAL_COMMUNITY): Payer: No Typology Code available for payment source

## 2012-03-21 ENCOUNTER — Encounter (HOSPITAL_COMMUNITY): Payer: Self-pay | Admitting: Family Medicine

## 2012-03-21 ENCOUNTER — Emergency Department (HOSPITAL_COMMUNITY)
Admission: EM | Admit: 2012-03-21 | Discharge: 2012-03-21 | Disposition: A | Discharge status: Home / Self Care | Payer: No Typology Code available for payment source | Attending: Emergency Medicine | Admitting: Emergency Medicine

## 2012-03-21 DIAGNOSIS — Y9389 Activity, other specified: Secondary | ICD-10-CM | POA: Insufficient documentation

## 2012-03-21 DIAGNOSIS — F17200 Nicotine dependence, unspecified, uncomplicated: Secondary | ICD-10-CM | POA: Insufficient documentation

## 2012-03-21 DIAGNOSIS — F209 Schizophrenia, unspecified: Secondary | ICD-10-CM | POA: Insufficient documentation

## 2012-03-21 DIAGNOSIS — S76019A Strain of muscle, fascia and tendon of unspecified hip, initial encounter: Secondary | ICD-10-CM

## 2012-03-21 DIAGNOSIS — Z79899 Other long term (current) drug therapy: Secondary | ICD-10-CM | POA: Insufficient documentation

## 2012-03-21 DIAGNOSIS — G40909 Epilepsy, unspecified, not intractable, without status epilepticus: Secondary | ICD-10-CM | POA: Insufficient documentation

## 2012-03-21 DIAGNOSIS — IMO0002 Reserved for concepts with insufficient information to code with codable children: Secondary | ICD-10-CM | POA: Insufficient documentation

## 2012-03-21 DIAGNOSIS — I10 Essential (primary) hypertension: Secondary | ICD-10-CM | POA: Insufficient documentation

## 2012-03-21 DIAGNOSIS — Y9289 Other specified places as the place of occurrence of the external cause: Secondary | ICD-10-CM | POA: Insufficient documentation

## 2012-03-21 DIAGNOSIS — F1010 Alcohol abuse, uncomplicated: Secondary | ICD-10-CM | POA: Insufficient documentation

## 2012-03-21 MED ORDER — CYCLOBENZAPRINE HCL 10 MG PO TABS: 10.0000 mg | ORAL_TABLET | Freq: Two times a day (BID) | ORAL | Status: DC | PRN
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
## 2012-11-02 ENCOUNTER — Emergency Department (INDEPENDENT_AMBULATORY_CARE_PROVIDER_SITE_OTHER)
Admission: EM | Admit: 2012-11-02 | Discharge: 2012-11-02 | Disposition: A | Discharge status: Home / Self Care | Payer: Medicare Other | Source: Home / Self Care

## 2012-11-02 ENCOUNTER — Encounter (HOSPITAL_COMMUNITY): Payer: Self-pay | Admitting: Emergency Medicine

## 2012-11-02 DIAGNOSIS — H109 Unspecified conjunctivitis: Secondary | ICD-10-CM

## 2012-11-02 MED ORDER — POLYMYXIN B-TRIMETHOPRIM 10000-0.1 UNIT/ML-% OP SOLN: 1.0000 [drp] | OPHTHALMIC | Status: DC

## 2012-11-02 NOTE — ED Provider Notes (Signed)
Alfred [name] is a 37 y.o. male who presents to Urgent Care today for bilateral conjunctivitis. Patient notes hearing and eye discharge developing since yesterday. He feels well otherwise without any fevers or chills cough or nausea vomiting or diarrhea. He notes that he's had a mild runny nose over the last several days. He denies any sick contacts. He denies any vision change or significant eye pain.   PMH reviewed. Significant for schizophrenia currently well controlled with respiratory History  Substance Use Topics  . Smoking status: Current Every Day Smoker  . Smokeless tobacco: Never Used  . Alcohol Use: Yes   ROS as above Medications reviewed. No current facility-administered medications for this encounter.   Current Outpatient Prescriptions  Medication Sig Dispense Refill  . OVER THE COUNTER MEDICATION Off brand "clear eye" eye drops      . divalproex (DEPAKOTE ER) 500 MG 24 hr tablet Take 1,000 mg by mouth at bedtime.       . polyethylene glycol (MIRALAX / GLYCOLAX) packet Take 17 g by mouth daily.      . risperiDONE (RISPERDAL) 3 MG tablet Take 3 mg by mouth 2 (two) times daily.      Marland Kitchen trimethoprim-polymyxin b (POLYTRIM) ophthalmic solution Place 1 drop into both eyes every 4 (four) hours.  10 mL  0    Exam:  BP 120/82  Pulse 88  Temp(Src) 98.6 F (37 C) (Oral)  Resp 16  SpO2 99% Gen: Well NAD HEENT: EOMI,  MMM, bilateral conjunctival injection with mild discharge. PERRLA Lungs: CTABL Nl WOB Heart: RRR no MRG Abd: NABS, NT, ND Exts: Non edematous BL  LE, warm and well perfused.   No results found for this or any previous visit (from the past 24 hour(s)). No results found.  Assessment and Plan: 37 y.o. male with conjunctivitis possibly bacterial versus viral. Plan to treat with Polytrim antibiotic eye drops. Followup as needed. Handout provided.  Discussed warning signs or symptoms. Please see discharge instructions. Patient expresses  understanding.      Rodolph Bong, MD 11/02/12 1020

## 2012-11-02 NOTE — ED Notes (Signed)
Does not wear contacts or glasses, denies vision changes

## 2012-11-02 NOTE — ED Notes (Signed)
Reports runny nose and then woke with bilateral red eyes Sunday, right worse than left.

## 2013-09-27 ENCOUNTER — Telehealth: Payer: Self-pay | Admitting: Gastroenterology

## 2013-09-27 NOTE — Telephone Encounter (Signed)
Patient states he has had rectal pain with bowel movements x 1 week. One episode of bright, red blood in stool. Scheduled with Mike GipAmy Esterwood, PA on 09/30/13 at 2:00 PM.

## 2013-09-30 ENCOUNTER — Encounter: Payer: Self-pay | Admitting: Physician Assistant

## 2013-09-30 ENCOUNTER — Ambulatory Visit (INDEPENDENT_AMBULATORY_CARE_PROVIDER_SITE_OTHER): Payer: Medicare HMO | Admitting: Physician Assistant

## 2013-09-30 VITALS — BP 116/76 | HR 100 | Ht 76.0 in | Wt 231.1 lb

## 2013-09-30 DIAGNOSIS — K648 Other hemorrhoids: Secondary | ICD-10-CM | POA: Diagnosis not present

## 2013-09-30 MED ORDER — POLYETHYLENE GLYCOL 3350 17 GM/SCOOP PO POWD: ORAL | Status: DC

## 2013-09-30 NOTE — Progress Notes (Signed)
Reviewed and agree with management plan.  Gayna Braddy T. Nickoles Gregori, MD FACG 

## 2013-09-30 NOTE — Progress Notes (Signed)
Subjective:    Patient ID: Alfred [name], male    DOB: 04/10/1975, 38 y.o.   MRN: [id number]  HPI Alfred [name] is a 38 year old African American male with history of schizophrenia known to Dr. Russella DarStark who had undergone colonoscopy in August of 2013 for complaints of rectal bleeding and was found to have moderate internal hemorrhoids and an otherwise normal exam. He also had EGD in August of 2013 showing mild gastritis. Patient comes in today with complaints of 3-4 week history of internal rectal pain. He says he feels his symptoms are gradually worsening in that he feels pain with urination, squatting for a bowel movement and with coughing. He's denies any dysuria or hematuria. He has chronic problems with constipation and admits that he has had increased straining recently. Is not necessarily having hard stools however. He has not noted any rectal bleeding. His appetite is fine and he denies abdominal pain.    Review of Systems  Constitutional: Negative.   HENT: Negative.   Eyes: Negative.   Respiratory: Negative.   Cardiovascular: Negative.   Gastrointestinal: Positive for rectal pain.  Endocrine: Negative.   Genitourinary: Negative.   Musculoskeletal: Negative.   Skin: Negative.   Allergic/Immunologic: Negative.   Neurological: Negative.   Hematological: Negative.   Psychiatric/Behavioral: Negative.    Outpatient Prescriptions Prior to Visit  Medication Sig Dispense Refill  . divalproex (DEPAKOTE ER) 500 MG 24 hr tablet Take 1,000 mg by mouth at bedtime.       Marland Kitchen. trimethoprim-polymyxin b (POLYTRIM) ophthalmic solution Place 1 drop into both eyes every 4 (four) hours.  10 mL  0  . OVER THE COUNTER MEDICATION Off brand "clear eye" eye drops      . polyethylene glycol (MIRALAX / GLYCOLAX) packet Take 17 g by mouth daily.      . risperiDONE (RISPERDAL) 3 MG tablet Take 3 mg by mouth 2 (two) times daily.       No facility-administered medications prior to visit.   No Known  Allergies Patient Active Problem List   Diagnosis Date Noted  . Internal hemorrhoids 09/30/2013  . SCHIZOPHRENIA 05/29/2006  . TOBACCO DEPENDENCE 05/29/2006   History  Substance Use Topics  . Smoking status: Current Every Day Smoker  . Smokeless tobacco: Never Used  . Alcohol Use: Yes   family history includes Celiac disease in his maternal grandmother; Colon polyps in his maternal uncle; Diabetes in his maternal grandfather, maternal grandmother, and mother; Heart disease in his maternal uncle.     Objective:   Physical Exam  [name] male in no acute distress blood pressure 116/76 pulse 100 height 6 foot 4 weight 231. HEENT; nontraumatic normocephalic EOMI PERRLA sclera anicteric, Supple; no JVD, Cardiovascular; regular rate and rhythm with S1-S2 there's no murmur or gallop, Pulmonary; clear bilaterally, Abdomen; soft nontender nondistended bowel sounds are active no palpable mass or hepatosplenomegaly, Rectal; exam no external lesion noted he is exquisitely tender on digital exam and would not allow anoscopy I do not feel an anal fissure no stool in the rectal vault. Extremities; no clubbing cyanosis or edema skin warm and dry, Psych; mood and affect appropriate.       Assessment & Plan:  #591  38 year old male with 3-4 week history of internal rectal pain. Digital exam difficult today do to tenderness. No external hemorrhoids or evidence of abscess etc. I suspect his pain is secondary to internal hemorrhoids cannot rule out a shallow fissure.  Plan; advised hot tub soaks at least once daily  Start MiraLax 17 g in 8 ounces of water daily Patient was given samples of Ana-lex -hydrocortisone/lidocaine to use 3 times daily internally over the next couple of weeks. Patient is advised to call if his symptoms are persisting after treatment for 2-3 weeks.

## 2013-09-30 NOTE — Patient Instructions (Addendum)
You have been given a separate informational sheet regarding your tobacco use, the importance of quitting and local resources to help you quit. We sent a prescription for Miralax generic to [facility] [address]. Take 17 grams of Miralax in 8 oz of water or juice daily.  We have given you samples of Ana-Lex  Cream. Use a small amount with the applicator just inside the rectum 3 times daily. Use vaseline on the applicator.    Soak in a hot tub once daily, before bedtime.  Call better if you are not better in a few weeks.

## 2013-10-23 ENCOUNTER — Emergency Department (HOSPITAL_COMMUNITY)
Admission: EM | Admit: 2013-10-23 | Discharge: 2013-10-24 | Disposition: A | Discharge status: Home / Self Care | Payer: Medicare HMO | Attending: Emergency Medicine | Admitting: Emergency Medicine

## 2013-10-23 DIAGNOSIS — S92919A Unspecified fracture of unspecified toe(s), initial encounter for closed fracture: Secondary | ICD-10-CM | POA: Insufficient documentation

## 2013-10-23 DIAGNOSIS — I10 Essential (primary) hypertension: Secondary | ICD-10-CM | POA: Insufficient documentation

## 2013-10-23 DIAGNOSIS — G40909 Epilepsy, unspecified, not intractable, without status epilepticus: Secondary | ICD-10-CM | POA: Insufficient documentation

## 2013-10-23 DIAGNOSIS — S5012XA Contusion of left forearm, initial encounter: Secondary | ICD-10-CM

## 2013-10-23 DIAGNOSIS — F17200 Nicotine dependence, unspecified, uncomplicated: Secondary | ICD-10-CM | POA: Diagnosis not present

## 2013-10-23 DIAGNOSIS — S40019A Contusion of unspecified shoulder, initial encounter: Secondary | ICD-10-CM | POA: Insufficient documentation

## 2013-10-23 DIAGNOSIS — S59909A Unspecified injury of unspecified elbow, initial encounter: Secondary | ICD-10-CM | POA: Insufficient documentation

## 2013-10-23 DIAGNOSIS — S6990XA Unspecified injury of unspecified wrist, hand and finger(s), initial encounter: Secondary | ICD-10-CM

## 2013-10-23 DIAGNOSIS — S40012A Contusion of left shoulder, initial encounter: Secondary | ICD-10-CM

## 2013-10-23 DIAGNOSIS — S59919A Unspecified injury of unspecified forearm, initial encounter: Secondary | ICD-10-CM

## 2013-10-23 DIAGNOSIS — Z8659 Personal history of other mental and behavioral disorders: Secondary | ICD-10-CM | POA: Diagnosis not present

## 2013-10-23 DIAGNOSIS — S5010XA Contusion of unspecified forearm, initial encounter: Secondary | ICD-10-CM | POA: Insufficient documentation

## 2013-10-23 DIAGNOSIS — S5002XA Contusion of left elbow, initial encounter: Secondary | ICD-10-CM

## 2013-10-23 DIAGNOSIS — S92401A Displaced unspecified fracture of right great toe, initial encounter for closed fracture: Secondary | ICD-10-CM
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
## 2013-10-24 ENCOUNTER — Emergency Department (HOSPITAL_COMMUNITY): Payer: Medicare HMO

## 2013-10-24 DIAGNOSIS — S5010XA Contusion of unspecified forearm, initial encounter: Secondary | ICD-10-CM | POA: Diagnosis not present

## 2013-10-24 MED ORDER — HYDROCODONE-ACETAMINOPHEN 5-325 MG PO TABS: 1.0000 | ORAL_TABLET | ORAL | Status: DC | PRN

## 2013-10-24 MED ORDER — IBUPROFEN 400 MG PO TABS
600.0000 mg | ORAL_TABLET | Freq: Once | ORAL | Status: AC
  Administered 2013-10-24: 600 mg via ORAL
  Filled 2013-10-24 (×2): qty 1

## 2013-10-24 MED ORDER — IBUPROFEN 600 MG PO TABS: 600.0000 mg | ORAL_TABLET | Freq: Four times a day (QID) | ORAL | Status: DC | PRN
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
## 2013-10-24 NOTE — Discharge Instructions (Signed)
Contusion A contusion is a deep bruise. Contusions are the result of an injury that caused bleeding under the skin. The contusion may turn blue, purple, or yellow. Minor injuries will give you a painless contusion, but more severe contusions may stay painful and swollen for a few weeks.  CAUSES  A contusion is usually caused by a blow, trauma, or direct force to an area of the body. SYMPTOMS   Swelling and redness of the injured area.  Bruising of the injured area.  Tenderness and soreness of the injured area.  Pain. DIAGNOSIS  The diagnosis can be made by taking a history and physical exam. An X-ray, CT scan, or MRI may be needed to determine if there were any associated injuries, such as fractures. TREATMENT  Specific treatment will depend on what area of the body was injured. In general, the best treatment for a contusion is resting, icing, elevating, and applying cold compresses to the injured area. Over-the-counter medicines may also be recommended for pain control. Ask your caregiver what the best treatment is for your contusion. HOME CARE INSTRUCTIONS   Put ice on the injured area.  Put ice in a plastic bag.  Place a towel between your skin and the bag.  Leave the ice on for 15-20 minutes, 3-4 times a day, or as directed by your health care provider.  Only take over-the-counter or prescription medicines for pain, discomfort, or fever as directed by your caregiver. Your caregiver may recommend avoiding anti-inflammatory medicines (aspirin, ibuprofen, and naproxen) for 48 hours because these medicines may increase bruising.  Rest the injured area.  If possible, elevate the injured area to reduce swelling. SEEK IMMEDIATE MEDICAL CARE IF:   You have increased bruising or swelling.  You have pain that is getting worse.  Your swelling or pain is not relieved with medicines. MAKE SURE YOU:   Understand these instructions.  Will watch your condition.  Will get help right  away if you are not doing well or get worse. Document Released: 12/26/2004 Document Revised: 03/23/2013 Document Reviewed: 01/21/2011 [facility] Patient Information 2015 [place], [place]. This information is not intended to replace advice given to you by your health care provider. Make sure you discuss any questions you have with your health care provider.  Toe Fracture Your caregiver has diagnosed you as having a fractured toe. A toe fracture is a break in the bone of a toe. "Buddy taping" is a way of splinting your broken toe, by taping the broken toe to the toe next to it. This "buddy taping" will keep the injured toe from moving beyond normal range of motion. Buddy taping also helps the toe heal in a more normal alignment. It may take 6 to 8 weeks for the toe injury to heal. HOME CARE INSTRUCTIONS   Leave your toes taped together for as long as directed by your caregiver or until you see a doctor for a follow-up examination. You can change the tape after bathing. Always use a small piece of gauze or cotton between the toes when taping them together. This will help the skin stay dry and prevent infection.  Apply ice to the injury for 15-20 minutes each hour while awake for the first 2 days. Put the ice in a plastic bag and place a towel between the bag of ice and your skin.  After the first 2 days, apply heat to the injured area. Use heat for the next 2 to 3 days. Place a heating pad on the foot  or soak the foot in warm water as directed by your caregiver.  Keep your foot elevated as much as possible to lessen swelling.  Wear sturdy, supportive shoes. The shoes should not pinch the toes or fit tightly against the toes.  Your caregiver may prescribe a rigid shoe if your foot is very swollen.  Your may be given crutches if the pain is too great and it hurts too much to walk.  Only take over-the-counter or prescription medicines for pain, discomfort, or fever as directed by your caregiver.  If  your caregiver has given you a follow-up appointment, it is very important to keep that appointment. Not keeping the appointment could result in a chronic or permanent injury, pain, and disability. If there is any problem keeping the appointment, you must call back to this facility for assistance. SEEK MEDICAL CARE IF:   You have increased pain or swelling, not relieved with medications.  The pain does not get better after 1 week.  Your injured toe is cold when the others are warm. SEEK IMMEDIATE MEDICAL CARE IF:   The toe becomes cold, numb, or white.  The toe becomes hot (inflamed) and red. Document Released: 03/15/2000 Document Revised: 06/10/2011 Document Reviewed: 11/02/2007 [facility] Patient Information 2015 [place], [place]. This information is not intended to replace advice given to you by your health care provider. Make sure you discuss any questions you have with your health care provider.

## 2013-10-25 ENCOUNTER — Telehealth (HOSPITAL_BASED_OUTPATIENT_CLINIC_OR_DEPARTMENT_OTHER): Payer: Self-pay

## 2013-10-25 MED ORDER — HYDROCODONE-ACETAMINOPHEN 5-325 MG PO TABS: 1.0000 | ORAL_TABLET | ORAL | Status: DC | PRN

## 2013-10-26 ENCOUNTER — Emergency Department (HOSPITAL_COMMUNITY): Payer: Medicare HMO

## 2013-10-26 ENCOUNTER — Encounter (HOSPITAL_COMMUNITY): Payer: Self-pay | Admitting: Emergency Medicine

## 2013-10-26 ENCOUNTER — Emergency Department (HOSPITAL_COMMUNITY)
Admission: EM | Admit: 2013-10-26 | Discharge: 2013-10-26 | Disposition: A | Discharge status: Home / Self Care | Payer: Medicare HMO | Attending: Emergency Medicine | Admitting: Emergency Medicine

## 2013-10-26 DIAGNOSIS — I10 Essential (primary) hypertension: Secondary | ICD-10-CM | POA: Diagnosis not present

## 2013-10-26 DIAGNOSIS — G40909 Epilepsy, unspecified, not intractable, without status epilepticus: Secondary | ICD-10-CM | POA: Diagnosis not present

## 2013-10-26 DIAGNOSIS — S7001XD Contusion of right hip, subsequent encounter: Secondary | ICD-10-CM

## 2013-10-26 DIAGNOSIS — Y9389 Activity, other specified: Secondary | ICD-10-CM | POA: Insufficient documentation

## 2013-10-26 DIAGNOSIS — F17200 Nicotine dependence, unspecified, uncomplicated: Secondary | ICD-10-CM | POA: Diagnosis not present

## 2013-10-26 DIAGNOSIS — Z79899 Other long term (current) drug therapy: Secondary | ICD-10-CM | POA: Insufficient documentation

## 2013-10-26 DIAGNOSIS — S7000XA Contusion of unspecified hip, initial encounter: Secondary | ICD-10-CM | POA: Diagnosis not present

## 2013-10-26 DIAGNOSIS — F209 Schizophrenia, unspecified: Secondary | ICD-10-CM | POA: Insufficient documentation

## 2013-10-26 DIAGNOSIS — Y929 Unspecified place or not applicable: Secondary | ICD-10-CM | POA: Diagnosis not present

## 2013-10-26 DIAGNOSIS — W108XXA Fall (on) (from) other stairs and steps, initial encounter: Secondary | ICD-10-CM | POA: Insufficient documentation

## 2013-10-26 DIAGNOSIS — S79919A Unspecified injury of unspecified hip, initial encounter: Secondary | ICD-10-CM | POA: Diagnosis present

## 2013-10-26 DIAGNOSIS — S79929A Unspecified injury of unspecified thigh, initial encounter: Secondary | ICD-10-CM

## 2013-10-26 NOTE — ED Provider Notes (Signed)
Medical screening examination/treatment/procedure(s) were performed by non-physician practitioner and as supervising physician I was immediately available for consultation/collaboration.     Suzi RootsKevin E Shauntia Levengood, MD 10/26/13 2004
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
## 2013-10-26 NOTE — Discharge Instructions (Signed)
Iliac Crest Contusion ° An iliac crest contusion is a deep bruise of your hip bone (hip pointer). Contusions happen when an injury causes bleeding under the skin. Signs of bruising include pain, puffiness (swelling), and discolored skin. The contusion may turn blue, purple, or yellow. °HOME CARE  °· Put ice on the injured area. °¨ Put ice in a plastic bag. °¨ Place a towel between your skin and the bag. °¨ Leave the ice on for 15-20 minutes, 03-04 times a day. °· Only take medicines as told by your doctor. °· Keep your leg straight (extended) when possible. °· Walk and move around as pain allows, or as told by your doctor. Use crutches if you are told to do so. °· Put on an elastic wrap as told by your doctor. You can take it off for sleeping, showers, and baths. °GET HELP RIGHT AWAY IF: °· You have more bruising or puffiness. °· You have pain that is getting worse. °· Your puffiness or pain is not helped by medicines. °· Your toes get cold. °MAKE SURE YOU:  °· Understand these instructions. °· Will watch your condition. °· Will get help right away if you are not doing well or get worse. °Document Released: 03/07/2011 Document Revised: 09/17/2011 Document Reviewed: 03/07/2011 °ExitCare® Patient Information ©2015 [employer], [employer]. This information is not intended to replace advice given to you by your health care provider. Make sure you discuss any questions you have with your health care provider. ° °
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
## 2014-02-02 ENCOUNTER — Telehealth: Payer: Self-pay

## 2014-02-02 ENCOUNTER — Telehealth: Payer: Self-pay | Admitting: Gastroenterology

## 2014-02-02 MED ORDER — LIDOCAINE-HYDROCORTISONE ACE 2-2 % RE KIT: PACK | RECTAL | Status: DC

## 2014-02-02 NOTE — Telephone Encounter (Signed)
Patient was evaluated in July for rectal bleeding, thought to be hemorrhoidal.  Patient was prescribed analex and Miralax.  He is out of the analex and has not been taking the Miralax.  He reports 2 BM yesterday with Bright red bleeding.  No further bleeding today.  He is advised to resume Miralax daily and that I will call in a refill of analex.  He will call back for additional rectal bleeding.

## 2014-02-02 NOTE — Telephone Encounter (Signed)
Ok, thanks.

## 2014-02-02 NOTE — Telephone Encounter (Signed)
Anal-lex too expensive per mom.  She just came from the pharmacy and an alternative had not been called in.  We talked to Mike GipAmy Esterwood PA-C by phone and she advised us to tell the patient to try Recticare cream or gel (coupon provided) and Preparation H , use together 3 to 4 times a day as needed.  Mom requested an appointment be made for her son. Amy's schedule was full for next Tues. 02/08/14 so we put him on Lori's schedule.

## 2014-02-08 ENCOUNTER — Encounter: Payer: Self-pay | Admitting: Physician Assistant

## 2014-02-08 ENCOUNTER — Ambulatory Visit (INDEPENDENT_AMBULATORY_CARE_PROVIDER_SITE_OTHER): Payer: Medicare HMO | Admitting: Physician Assistant

## 2014-02-08 VITALS — BP 110/80 | HR 72 | Ht 76.0 in | Wt 234.0 lb

## 2014-02-08 DIAGNOSIS — K648 Other hemorrhoids: Secondary | ICD-10-CM

## 2014-02-08 NOTE — Progress Notes (Signed)
Reviewed and agree with management plan.  Malcolm T. Stark, MD FACG 

## 2014-02-08 NOTE — Patient Instructions (Signed)
Purchase Preporation-H suppositories over the counter and coat the suppository with Recticare cream insert twice daily for ten days   Remember fruits that start with a P will help you have a bowel movement, examples are: Peaches  Prunes Pineapples  Pears Plums  ___________________________________________________________________________________________________________________ Constipation Constipation is when a person has fewer than three bowel movements a week, has difficulty having a bowel movement, or has stools that are dry, hard, or larger than normal. As people grow older, constipation is more common. If you try to fix constipation with medicines that make you have a bowel movement (laxatives), the problem may get worse. Long-term laxative use may cause the muscles of the colon to become weak. A low-fiber diet, not taking in enough fluids, and taking certain medicines may make constipation worse.  CAUSES   Certain medicines, such as antidepressants, pain medicine, iron supplements, antacids, and water pills.   Certain diseases, such as diabetes, irritable bowel syndrome (IBS), thyroid disease, or depression.   Not drinking enough water.   Not eating enough fiber-rich foods.   Stress or travel.   Lack of physical activity or exercise.   Ignoring the urge to have a bowel movement.   Using laxatives too much.  SIGNS AND SYMPTOMS   Having fewer than three bowel movements a week.   Straining to have a bowel movement.   Having stools that are hard, dry, or larger than normal.   Feeling full or bloated.   Pain in the lower abdomen.   Not feeling relief after having a bowel movement.  TREATMENT  Treatment will depend on the severity of your constipation and what is causing it. Some dietary treatments include drinking more fluids and eating more fiber-rich foods. Lifestyle treatments may include regular exercise. If these diet and lifestyle recommendations do not  help, your health care provider may recommend taking over-the-counter laxative medicines to help you have bowel movements. Prescription medicines may be prescribed if over-the-counter medicines do not work.  HOME CARE INSTRUCTIONS   Eat foods that have a lot of fiber, such as fruits, vegetables, whole grains, and beans.  Limit foods high in fat and processed sugars, such as french fries, hamburgers, cookies, candies, and soda.   A fiber supplement may be added to your diet if you cannot get enough fiber from foods.   Drink enough fluids to keep your urine clear or pale yellow.   Exercise regularly or as directed by your health care provider.   Go to the restroom when you have the urge to go. Do not hold it.   Only take over-the-counter or prescription medicines as directed by your health care provider. Do not take other medicines for constipation without talking to your health care provider first.  SEEK IMMEDIATE MEDICAL CARE IF:   You have bright red blood in your stool.   Your constipation lasts for more than 4 days or gets worse.   You have abdominal or rectal pain.   You have thin, pencil-like stools.   You have unexplained weight loss. MAKE SURE YOU:   Understand these instructions.  Will watch your condition.  Will get help right away if you are not doing well or get worse. Document Released: 12/15/2003 Document Revised: 03/23/2013 Document Reviewed: 12/28/2012 [facility] Patient Information 2015 [employer], [place]. This information is not intended to replace advice given to you by your health care provider. Make sure you discuss any questions you have with your health care provider.

## 2014-02-08 NOTE — Progress Notes (Signed)
Patient ID: Alfred [name], male   DOB: 07-Dec-1975, 38 y.o.   MRN: [id number]     History of Present Illness: this is a follow-up for this 38 year old male who was known to Dr. Russella DarStark with complaints of rectal bleeding. He had a colonoscopy in August 2013 and was found to have moderate internal hemorrhoids and an otherwise normal exam. He also had an EGD in August 2013 that showed mild gastritis. He was last seen here in July with a several week history of intermittent rectal pain he was evaluated by Cristina GongAmy Esther Wood, PA-C, and found to have internal hemorrhoids he was prescribed anal lax cream but unfortunately was unable to afford it. He recently picked up a tube of rectal care 5% cream but has not used it because he is afraid to put anything into his rectum. He has less rectal pain but continues to have intermittent blood on the toilet tissue when he wipes. He feels his stools have been hard intermittently and he occasionally strains. He has been trying to drink more water and has been using Mira lax which he feels helps.   Past Medical History  Diagnosis Date  . Schizophrenia   . Seizure     alcoho  . Alcohol abuse   . Hypertension     Past Surgical History  Procedure Laterality Date  . Wisdom tooth extraction     Family History  Problem Relation Age of Onset  . Diabetes Mother   . Colon polyps Maternal Uncle   . Heart disease Maternal Uncle   . Celiac disease Maternal Grandmother   . Diabetes Maternal Grandmother   . Diabetes Maternal Grandfather    History  Substance Use Topics  . Smoking status: Current Every Day Smoker  . Smokeless tobacco: Never Used  . Alcohol Use: Yes   Current Outpatient Prescriptions  Medication Sig Dispense Refill  . divalproex (DEPAKOTE ER) 500 MG 24 hr tablet Take 1,000 mg by mouth at bedtime.     . risperiDONE (RISPERDAL) 3 MG tablet Take 3 mg by mouth at bedtime.    . Lidocaine, Anorectal, (RECTICARE) 5 % CREA Apply to rectum 3-4 times a day as  needed.    . polyethylene glycol powder (GLYCOLAX/MIRALAX) powder Take 17 grams in 8 oz if water or juice daily. 527 g 1   No current facility-administered medications for this visit.   No Known Allergies    Review of Systems: Gen: Denies any fever, chills, sweats, anorexia, fatigue, weakness, malaise, weight loss, and sleep disorder CV: Denies chest pain, angina, palpitations, syncope, orthopnea, PND, peripheral edema, and claudication. Resp: Denies dyspnea at rest, dyspnea with exercise, cough, sputum, wheezing, coughing up blood, and pleurisy. GI: Denies vomiting blood, jaundice, and fecal incontinence.   Denies dysphagia or odynophagia. GU : Denies urinary burning, blood in urine, urinary frequency, urinary hesitancy, nocturnal urination, and urinary incontinence. MS: Denies joint pain, limitation of movement, and swelling, stiffness, low back pain, extremity pain. Denies muscle weakness, cramps, atrophy.  Derm: Denies rash, itching, dry skin, hives, moles, warts, or unhealing ulcers.  Psych: Denies depression, anxiety, memory loss, suicidal ideation, hallucinations, paranoia, and confusion. Heme: Denies bruising, bleeding, and enlarged lymph nodes. Neuro:  Denies any headaches, dizziness, paresthesia Endo:  Denies any problems with DM, thyroid, adrenal  Physical Exam: General: Pleasant, well developed ,male in no acute distress Head: Normocephalic and atraumatic Eyes:  sclerae anicteric, conjunctiva pink  Ears: Normal auditory acuity Lungs: Clear throughout to auscultation Heart: Regular rate and  rhythm Abdomen: Soft, non distended, non-tender. No masses, no hepatomegaly. Normal bowel sounds Rectal: no external lesion, DRE with palpable groove? Old fissure. Anoscopy with internal hemorrhoids Musculoskeletal: Symmetrical with no gross deformities  Extremities: No edema  Neurological: Alert oriented x 4, grossly nonfocal Psychological:  Alert and cooperative. Normal mood and  affect  Assessment and Recommendations: #1. Internal hemorrhoids. He likely has an old healed fissure as well. The patient is willing to use Preparation H suppositories, and so he was advised to apply his recticare 5% cream to the Preparation H suppository and insert a suppository twice daily rectally for 10 days. He has been instructed to continue his marrow lax. He has been advised to use "P" fruits-peaches, pears, prunes, plums, and pineapple as these may help his bowel movements. He will follow up on an as-needed basis.   [name], Tollie PizzaLori P PA-C 02/08/2014,

## 2015-11-22 DIAGNOSIS — M545 Low back pain: Secondary | ICD-10-CM | POA: Diagnosis not present

## 2015-11-22 DIAGNOSIS — M5136 Other intervertebral disc degeneration, lumbar region: Secondary | ICD-10-CM | POA: Diagnosis not present

## 2015-11-22 DIAGNOSIS — M170 Bilateral primary osteoarthritis of knee: Secondary | ICD-10-CM | POA: Diagnosis not present

## 2015-11-22 DIAGNOSIS — M7531 Calcific tendinitis of right shoulder: Secondary | ICD-10-CM | POA: Diagnosis not present

## 2015-12-27 DIAGNOSIS — M7531 Calcific tendinitis of right shoulder: Secondary | ICD-10-CM | POA: Diagnosis not present

## 2015-12-27 DIAGNOSIS — M170 Bilateral primary osteoarthritis of knee: Secondary | ICD-10-CM | POA: Diagnosis not present

## 2015-12-27 DIAGNOSIS — M5136 Other intervertebral disc degeneration, lumbar region: Secondary | ICD-10-CM | POA: Diagnosis not present

## 2015-12-27 DIAGNOSIS — M545 Low back pain: Secondary | ICD-10-CM | POA: Diagnosis not present

## 2016-01-26 DIAGNOSIS — M545 Low back pain: Secondary | ICD-10-CM | POA: Diagnosis not present

## 2016-01-26 DIAGNOSIS — M5136 Other intervertebral disc degeneration, lumbar region: Secondary | ICD-10-CM | POA: Diagnosis not present

## 2016-01-26 DIAGNOSIS — M170 Bilateral primary osteoarthritis of knee: Secondary | ICD-10-CM | POA: Diagnosis not present

## 2016-01-26 DIAGNOSIS — M7531 Calcific tendinitis of right shoulder: Secondary | ICD-10-CM | POA: Diagnosis not present

## 2016-02-26 DIAGNOSIS — M5136 Other intervertebral disc degeneration, lumbar region: Secondary | ICD-10-CM | POA: Diagnosis not present

## 2016-03-27 DIAGNOSIS — M7531 Calcific tendinitis of right shoulder: Secondary | ICD-10-CM | POA: Diagnosis not present

## 2016-03-27 DIAGNOSIS — M170 Bilateral primary osteoarthritis of knee: Secondary | ICD-10-CM | POA: Diagnosis not present

## 2016-03-27 DIAGNOSIS — M545 Low back pain: Secondary | ICD-10-CM | POA: Diagnosis not present

## 2016-03-27 DIAGNOSIS — M5136 Other intervertebral disc degeneration, lumbar region: Secondary | ICD-10-CM | POA: Diagnosis not present

## 2016-04-26 ENCOUNTER — Encounter (HOSPITAL_COMMUNITY): Payer: Self-pay | Admitting: Emergency Medicine

## 2016-04-26 ENCOUNTER — Emergency Department (HOSPITAL_COMMUNITY)
Admission: EM | Admit: 2016-04-26 | Discharge: 2016-04-26 | Disposition: A | Discharge status: Home / Self Care | Payer: Medicare HMO | Attending: Emergency Medicine | Admitting: Emergency Medicine

## 2016-04-26 ENCOUNTER — Emergency Department (HOSPITAL_COMMUNITY): Payer: Medicare HMO

## 2016-04-26 DIAGNOSIS — K219 Gastro-esophageal reflux disease without esophagitis: Secondary | ICD-10-CM | POA: Insufficient documentation

## 2016-04-26 DIAGNOSIS — S2095XA Superficial foreign body of unspecified parts of thorax, initial encounter: Secondary | ICD-10-CM | POA: Diagnosis not present

## 2016-04-26 DIAGNOSIS — R070 Pain in throat: Secondary | ICD-10-CM | POA: Diagnosis not present

## 2016-04-26 DIAGNOSIS — M7531 Calcific tendinitis of right shoulder: Secondary | ICD-10-CM | POA: Diagnosis not present

## 2016-04-26 DIAGNOSIS — R69 Illness, unspecified: Secondary | ICD-10-CM | POA: Diagnosis not present

## 2016-04-26 DIAGNOSIS — R0989 Other specified symptoms and signs involving the circulatory and respiratory systems: Secondary | ICD-10-CM | POA: Diagnosis not present

## 2016-04-26 DIAGNOSIS — S1095XA Superficial foreign body of unspecified part of neck, initial encounter: Secondary | ICD-10-CM | POA: Diagnosis not present

## 2016-04-26 DIAGNOSIS — I10 Essential (primary) hypertension: Secondary | ICD-10-CM | POA: Insufficient documentation

## 2016-04-26 DIAGNOSIS — F17200 Nicotine dependence, unspecified, uncomplicated: Secondary | ICD-10-CM | POA: Diagnosis not present

## 2016-04-26 DIAGNOSIS — M545 Low back pain: Secondary | ICD-10-CM | POA: Diagnosis not present

## 2016-04-26 DIAGNOSIS — M170 Bilateral primary osteoarthritis of knee: Secondary | ICD-10-CM | POA: Diagnosis not present

## 2016-04-26 DIAGNOSIS — M5136 Other intervertebral disc degeneration, lumbar region: Secondary | ICD-10-CM | POA: Diagnosis not present

## 2016-04-26 MED ORDER — RANITIDINE HCL 150 MG PO TABS: 150.0000 mg | ORAL_TABLET | Freq: Two times a day (BID) | ORAL | 0 refills | Status: DC

## 2016-04-26 MED ORDER — GI COCKTAIL ~~LOC~~
30.0000 mL | Freq: Once | ORAL | Status: AC
  Administered 2016-04-26: 30 mL via ORAL
  Filled 2016-04-26: qty 30

## 2016-04-26 MED ORDER — OMEPRAZOLE 20 MG PO CPDR: 40.0000 mg | DELAYED_RELEASE_CAPSULE | Freq: Every day | ORAL | 0 refills | Status: DC

## 2016-04-26 MED ORDER — SUCRALFATE 1 GM/10ML PO SUSP: 1.0000 g | Freq: Three times a day (TID) | ORAL | 0 refills | Status: DC

## 2016-04-26 NOTE — Discharge Instructions (Signed)
Your symptoms may be due to acid reflux. You have been prescribed zantac, omeprazole and carafate suspension. Please take these medications as prescribed.   Please continue taking your schizophrenia medications.    Please follow up with your primary care provider as soon as possible for further discussion of your symptoms  Follow up with a GI doctor in 5-7 days for further evaluation.

## 2016-04-26 NOTE — ED Notes (Signed)
Pt given coffee and crackers  

## 2016-04-26 NOTE — ED Provider Notes (Signed)
[facility]-EMERGENCY DEPT Provider Note   CSN: [id number] Arrival date & time: 04/26/16  4098 By signing my name below, I, Bridgette Habermann, attest that this documentation has been prepared under the direction and in the presence of Sharen Heck, PA-C. Electronically Signed: Bridgette Habermann, ED Scribe. 04/26/16. 12:20 PM.  History   Chief Complaint Chief Complaint  Patient presents with  . Sore Throat    feels like something is in his throat    HPI The history is provided by the patient. No language interpreter was used.   HPI Comments: Alfred [name] is a 41 y.o. male with h/o alcohol abuse, HTN schizophrenia, and seizure, who presents to the Emergency Department complaining of sensation of foreign body onset 3 days ago. Pt states that "there is an animal in my throat" and has been "spitting up" because he has some mild trouble swallowing. He reports he has chest pain reproducible when he swallows.He reports normal appetite, denies any suspicious food intake. Pt has h/o schizophrenia and states he has not taken his medications for two days. Denies h/o GERD. Denies fever.  Patient able to eat and drink without nausea or vomiting. No exertional chest pain. No h/o heavy NSAID use.    Past Medical History:  Diagnosis Date  . Alcohol abuse   . Hypertension   . Schizophrenia (HCC)   . Seizure (HCC)    alcoho    Patient Active Problem List   Diagnosis Date Noted  . Internal hemorrhoids 09/30/2013  . SCHIZOPHRENIA 05/29/2006  . TOBACCO DEPENDENCE 05/29/2006    Past Surgical History:  Procedure Laterality Date  . WISDOM TOOTH EXTRACTION         Home Medications    Prior to Admission medications   Medication Sig Start Date End Date Taking? Authorizing Provider  divalproex (DEPAKOTE ER) 500 MG 24 hr tablet Take 1,000 mg by mouth at bedtime.     Historical Provider, MD  Lidocaine, Anorectal, (RECTICARE) 5 % CREA Apply to rectum 3-4 times a day as needed.    Historical Provider, MD    omeprazole (PRILOSEC) 20 MG capsule Take 2 capsules (40 mg total) by mouth daily. 04/26/16   Liberty Handy, PA-C  polyethylene glycol powder (GLYCOLAX/MIRALAX) powder Take 17 grams in 8 oz if water or juice daily. 09/30/13   Amy S Esterwood, PA-C  ranitidine (ZANTAC) 150 MG tablet Take 1 tablet (150 mg total) by mouth 2 (two) times daily. 04/26/16   Liberty Handy, PA-C  risperiDONE (RISPERDAL) 3 MG tablet Take 3 mg by mouth at bedtime.    Historical Provider, MD  sucralfate (CARAFATE) 1 GM/10ML suspension Take 10 mLs (1 g total) by mouth 4 (four) times daily -  with meals and at bedtime. 04/26/16   Liberty Handy, PA-C    Family History Family History  Problem Relation Age of Onset  . Diabetes Mother   . Colon polyps Maternal Uncle   . Heart disease Maternal Uncle   . Celiac disease Maternal Grandmother   . Diabetes Maternal Grandmother   . Diabetes Maternal Grandfather     Social History Social History  Substance Use Topics  . Smoking status: Current Every Day Smoker  . Smokeless tobacco: Never Used  . Alcohol use Yes     Allergies   Patient has no known allergies.   Review of Systems Review of Systems  Constitutional: Negative for chills and fever.  HENT: Positive for trouble swallowing. Negative for congestion and sore throat.   Eyes:  Negative for visual disturbance.  Respiratory: Negative for cough, chest tightness and shortness of breath.   Cardiovascular: Positive for chest pain.  Gastrointestinal: Negative for abdominal pain, constipation, diarrhea, nausea and vomiting.  Genitourinary: Negative for decreased urine volume and difficulty urinating.  Musculoskeletal: Negative for arthralgias and joint swelling.  Skin: Negative for rash.  Neurological: Negative for dizziness, light-headedness and headaches.     Physical Exam Updated Vital Signs BP 124/73 (BP Location: Left Arm)   Pulse 100   Temp 98.3 F (36.8 C) (Oral)   Resp 16   Ht 6\' 6"  (1.981 m)    Wt 72.6 kg   SpO2 100%   BMI 18.49 kg/m   Physical Exam  Constitutional: He is oriented to person, place, and time. He appears well-developed and well-nourished. No distress.  NAD.  HENT:  Head: Normocephalic and atraumatic.  Right Ear: External ear normal.  Left Ear: External ear normal.  Nose: Nose normal.  Mouth/Throat: Posterior oropharyngeal erythema present. No oropharyngeal exudate.  Moist mucous membranes.  No nasal mucosa edema. Oropharynx mildly erythematous. Slight cobble-stoning in the oropharynx. No edema, exudates or lesions.  Uvula midline. No trismus.   Eyes: Conjunctivae and EOM are normal. Pupils are equal, round, and reactive to light. No scleral icterus.  Neck: Normal range of motion. Neck supple. No JVD present. No tracheal deviation present.  Cardiovascular: Normal rate, regular rhythm, normal heart sounds and intact distal pulses.   No murmur heard. Pulmonary/Chest: Effort normal and breath sounds normal. He has no wheezes.  Abdominal: Soft. He exhibits no distension. There is no tenderness.  Musculoskeletal: Normal range of motion. He exhibits no deformity.  Lymphadenopathy:    He has no cervical adenopathy.  Neurological: He is alert and oriented to person, place, and time.  Skin: Skin is warm and dry. Capillary refill takes less than 2 seconds.  Psychiatric: He has a normal mood and affect. His behavior is normal. Judgment and thought content normal.  Nursing note and vitals reviewed.   ED Treatments / Results  DIAGNOSTIC STUDIES: Oxygen Saturation is 100% on RA, normal by my interpretation.    COORDINATION OF CARE: 12:18 PM Discussed treatment plan with pt at bedside and pt agreed to plan.  Labs (all labs ordered are listed, but only abnormal results are displayed) Labs Reviewed - No data to display  EKG  EKG Interpretation None       Radiology Dg Neck Soft Tissue  Result Date: 04/26/2016 CLINICAL DATA:  Sensation of foreign body. EXAM:  NECK SOFT TISSUES - 1+ VIEW COMPARISON:  CT 10/24/2013 . FINDINGS: Diffuse soft tissue prominence of the neck noted. This may be related to the patient's body habitus. Clinical correlation suggested. If further evaluation is needed IV contrast-enhanced neck CT can be obtained. Epiglottis, retropharyngeal space appear normal. Cervical airway an upper lungs appear normal. Diffuse degenerative change. No acute bony abnormality . IMPRESSION: Soft tissue prominence noted about the neck diffusely. This may be related to the patient's body habitus. Clinical correlation suggested. If further evaluation of the soft tissues of the neck is needed IV contrast-enhanced neck CT can be obtained. No acute or focal abnormality identified. Diffuse degenerative changes cervical spine. Electronically Signed   By: Maisie Fushomas  Register   On: 04/26/2016 13:15   Dg Chest 1 View  Result Date: 04/26/2016 CLINICAL DATA:  Sensation of foreign body. EXAM: CHEST 1 VIEW COMPARISON:  11/02/2004. FINDINGS: Mediastinum and hilar structures are normal. Lungs are clear. No pleural effusion or pneumothorax. Heart  size normal. Mild thoracic spine scoliosis. IMPRESSION: No acute cardiopulmonary disease. Electronically Signed   By: Maisie Fus  Register   On: 04/26/2016 13:13    Procedures Procedures (including critical care time)  Medications Ordered in ED Medications  gi cocktail (Maalox,Lidocaine,Donnatal) (30 mLs Oral Given 04/26/16 1354)     Initial Impression / Assessment and Plan / ED Course  I have reviewed the triage vital signs and the nursing notes.  Pertinent labs & imaging results that were available during my care of the patient were reviewed by me and considered in my medical decision making (see chart for details).    41 yo male with pertinent pmh of ETOH abuse (1-2 beers daily), schizophrenia and seizures presents with foreign body sensation at back of his throat/chest x 3 days.  Pt states it feels like "a cat is back there".   Pt currently denies nausea, vomiting, abdominal pain, bloody stools. Pt denies heavy use of NSAIDs or previous h/o ulcers.  Pt notes he has not been taking his schizophrenia for 2 days. On exam pt is constantly spitting up every 2-3 words during encounter. HEENT exam remarkable for mild cobblestoning in oropharynx.  Abdominal exam unremarkable. X-rays neck and chest unremarkable without FBs seen.  Pt given gi cocktail and PO fluids/food in ED which he tolerated without complications. Unclear about etiology of symptoms, there may be a GI (GERD, esophagitis) and schizophrenia component contributing to symptoms.  Given normal VS, benign HEENT/Abdominal exam and unremarkable x-ray pt will be discharged with medications for GERD and GI f/u.    Final Clinical Impressions(s) / ED Diagnoses   Final diagnoses:  Throat discomfort  Foreign body sensation in throat  Gastroesophageal reflux disease, esophagitis presence not specified    New Prescriptions New Prescriptions   OMEPRAZOLE (PRILOSEC) 20 MG CAPSULE    Take 2 capsules (40 mg total) by mouth daily.   RANITIDINE (ZANTAC) 150 MG TABLET    Take 1 tablet (150 mg total) by mouth 2 (two) times daily.   SUCRALFATE (CARAFATE) 1 GM/10ML SUSPENSION    Take 10 mLs (1 g total) by mouth 4 (four) times daily -  with meals and at bedtime.   I personally performed the services described in this documentation, which was scribed in my presence. The recorded information has been reviewed and is accurate.    Liberty Handy, PA-C 04/26/16 1427    Melene Plan, DO 04/26/16 1438

## 2016-04-26 NOTE — ED Notes (Signed)
Pt came to the nurses station and states "I think the thing has moved to my nose. Can I get something to push it out?" this RN gave pt tissues to blow his nose.

## 2016-04-26 NOTE — ED Triage Notes (Signed)
Pt states his throat is sore and feels like something is stuck in his throat. Pt feels like an animal is stuck in his throat. Pt is not speaking metaphorically. Pt states he believes a rat or a cat is in his throat. Pt states, "I feel a wholesome in my throat, like a solid mass. And I'm kinda low on fluid too." Pt speaking clearly, no respiratory distress, no foreign objects seen in mouth.

## 2016-05-02 ENCOUNTER — Ambulatory Visit (INDEPENDENT_AMBULATORY_CARE_PROVIDER_SITE_OTHER): Payer: Medicare HMO | Admitting: Adult Health

## 2016-05-02 ENCOUNTER — Encounter: Payer: Self-pay | Admitting: Adult Health

## 2016-05-02 VITALS — BP 142/70 | Temp 98.1°F | Ht 78.0 in | Wt 213.0 lb

## 2016-05-02 DIAGNOSIS — B353 Tinea pedis: Secondary | ICD-10-CM

## 2016-05-02 DIAGNOSIS — Z7689 Persons encountering health services in other specified circumstances: Secondary | ICD-10-CM | POA: Diagnosis not present

## 2016-05-02 DIAGNOSIS — Z23 Encounter for immunization: Secondary | ICD-10-CM | POA: Diagnosis not present

## 2016-05-02 DIAGNOSIS — F209 Schizophrenia, unspecified: Secondary | ICD-10-CM

## 2016-05-02 DIAGNOSIS — F17200 Nicotine dependence, unspecified, uncomplicated: Secondary | ICD-10-CM

## 2016-05-02 DIAGNOSIS — K2100 Gastro-esophageal reflux disease with esophagitis, without bleeding: Secondary | ICD-10-CM

## 2016-05-02 DIAGNOSIS — R69 Illness, unspecified: Secondary | ICD-10-CM | POA: Diagnosis not present

## 2016-05-02 MED ORDER — OMEPRAZOLE 40 MG PO CPDR: 40.0000 mg | DELAYED_RELEASE_CAPSULE | Freq: Every day | ORAL | 3 refills | Status: DC

## 2016-05-02 NOTE — Progress Notes (Signed)
Patient presents to clinic today to establish care. Alfred [name] is a 41 year old male who  has a past medical history of Alcohol abuse; Depression; Hypertension; Migraines; Schizophrenia (HCC); and Seizure (HCC).  Unknown last physical   Acute Concerns: Establish Care  Chronic Issues: Schizophrenia- Risperdal and is [facility]. Family reports that he is not always compliant with medications.   Seizures - appear to be alcohol induced. Taked Depakote  GERD - was seen in the ER this week. Was prescribed Prilosec. Continues to complain of stomach pain.   Health Maintenance: Dental --Does not do routine care Vision -- Does not do routine care Immunizations -- Needs Tdap and Flu.. Declined flu  Colonoscopy -- Never had   [facility]   BP Readings from Last 3 Encounters:  05/02/16 (!) 142/70  04/26/16 98/75  02/08/14 110/80     Past Medical History:  Diagnosis Date  . Alcohol abuse   . Depression   . Hypertension   . Migraines   . Schizophrenia (HCC)   . Seizure (HCC)    alcoho    Past Surgical History:  Procedure Laterality Date  . WISDOM TOOTH EXTRACTION      Current Outpatient Prescriptions on File Prior to Visit  Medication Sig Dispense Refill  . divalproex (DEPAKOTE ER) 500 MG 24 hr tablet Take 1,000 mg by mouth at bedtime.     . Lidocaine, Anorectal, (RECTICARE) 5 % CREA Apply to rectum 3-4 times a day as needed.    Marland Kitchen. omeprazole (PRILOSEC) 20 MG capsule Take 2 capsules (40 mg total) by mouth daily. 30 capsule 0  . polyethylene glycol powder (GLYCOLAX/MIRALAX) powder Take 17 grams in 8 oz if water or juice daily. 527 g 1  . ranitidine (ZANTAC) 150 MG tablet Take 1 tablet (150 mg total) by mouth 2 (two) times daily. 60 tablet 0  . risperiDONE (RISPERDAL) 3 MG tablet Take 3 mg by mouth at bedtime.    . sucralfate (CARAFATE) 1 GM/10ML suspension Take 10 mLs (1 g total) by mouth 4 (four) times daily -  with  meals and at bedtime. 420 mL 0   No current facility-administered medications on file prior to visit.     No Known Allergies  Family History  Problem Relation Age of Onset  . Diabetes Mother   . Hypertension Mother   . Hyperlipidemia Mother   . Anemia Mother   . Heart murmur Mother   . Colon polyps Maternal Uncle   . Heart disease Maternal Uncle   . Celiac disease Maternal Grandmother   . Diabetes Maternal Grandmother   . Diabetes Maternal Grandfather     Social History   Social History  . Marital status: Single    Spouse name: N/A  . Number of children: 3  . Years of education: N/A   Occupational History  . Unemployed    Social History Main Topics  . Smoking status: Current Every Day Smoker    Packs/day: 1.00    Types: Cigarettes  . Smokeless tobacco: Never Used  . Alcohol use Yes  . Drug use: Yes    Types: Marijuana  . Sexual activity: Not on file   Other Topics Concern  . Not on file   Social History Narrative  . No narrative on file    Review of Systems  Constitutional: Negative.   HENT: Negative.   Eyes: Negative.   Respiratory: Negative.   Cardiovascular: Negative.  Gastrointestinal: Positive for heartburn. Negative for abdominal pain, blood in stool, constipation, diarrhea and melena.  Genitourinary: Negative.   Musculoskeletal: Negative.   Skin: Negative.   Neurological: Negative.   Psychiatric/Behavioral: Positive for depression and substance abuse. The patient is not nervous/anxious.   All other systems reviewed and are negative.   BP (!) 142/70   Temp 98.1 F (36.7 C) (Oral)   Ht 6\' 6"  (1.981 m)   Wt 213 lb (96.6 kg)   BMI 24.61 kg/m   Physical Exam  Constitutional: He is oriented to person, place, and time and well-developed, well-nourished, and in no distress. No distress.  Unkempt   Eyes: Conjunctivae and EOM are normal. Pupils are equal, round, and reactive to light. Right eye exhibits no discharge. Left eye exhibits no  discharge. No scleral icterus.  Neck: Normal range of motion. Neck supple.  Cardiovascular: Normal rate, regular rhythm, normal heart sounds and intact distal pulses.  Exam reveals no gallop and no friction rub.   No murmur heard. Pulmonary/Chest: Effort normal. No respiratory distress. He has no wheezes. He has no rales. He exhibits no tenderness.  Abdominal: Soft. Bowel sounds are normal. He exhibits no distension and no mass. There is tenderness in the epigastric area. There is no rebound and no guarding.  Musculoskeletal: Normal range of motion. He exhibits no edema, tenderness or deformity.  Neurological: He is alert and oriented to person, place, and time. He has normal reflexes. He displays normal reflexes. No cranial nerve deficit. He exhibits normal muscle tone. Gait normal. Coordination normal. GCS score is 15.  Skin: Skin is warm and dry. No rash noted. He is not diaphoretic. No erythema. No pallor.  athletes foot infection in bilateral feet  Psychiatric: Mood, memory and judgment normal. His mood appears not anxious. His affect is blunt. He is agitated. He does not exhibit a depressed mood. He expresses no homicidal and no suicidal ideation. He expresses no suicidal plans.  Nursing note and vitals reviewed.  Assessment/Plan: 1. Encounter to establish care - Follow up for CPE - Follow up sooner if needed - Needs to eat healthy and exercise  2. Need for Tdap vaccination  - Tdap vaccine greater than or equal to 7yo IM  3. Schizophrenia, unspecified type (HCC) - Follow up with psychiatry. This is his biggest issue currently.   4. TOBACCO DEPENDENCE - Does not want to quit smoking at this time  5. Tinea pedis of both feet - Lotrimin  - Keep feet clean and dry   6. Gastroesophageal reflux disease with esophagitis  - omeprazole (PRILOSEC) 40 MG capsule; Take 1 capsule (40 mg total) by mouth daily.  Dispense: 90 capsule; Refill: 3  Shirline Frees, NP

## 2016-05-02 NOTE — Patient Instructions (Signed)
It was great meeting you today   Please follow up with psychiatry   Follow up with me for your physical.   Work on quitting smoking

## 2016-05-03 ENCOUNTER — Other Ambulatory Visit: Payer: Self-pay

## 2016-05-03 DIAGNOSIS — K2100 Gastro-esophageal reflux disease with esophagitis, without bleeding: Secondary | ICD-10-CM

## 2016-05-03 MED ORDER — OMEPRAZOLE 40 MG PO CPDR: 40.0000 mg | DELAYED_RELEASE_CAPSULE | Freq: Every day | ORAL | 3 refills | Status: DC

## 2016-05-27 DIAGNOSIS — M7531 Calcific tendinitis of right shoulder: Secondary | ICD-10-CM | POA: Diagnosis not present

## 2016-05-27 DIAGNOSIS — M545 Low back pain: Secondary | ICD-10-CM | POA: Diagnosis not present

## 2016-05-27 DIAGNOSIS — M5136 Other intervertebral disc degeneration, lumbar region: Secondary | ICD-10-CM | POA: Diagnosis not present

## 2016-05-27 DIAGNOSIS — M170 Bilateral primary osteoarthritis of knee: Secondary | ICD-10-CM | POA: Diagnosis not present

## 2016-05-28 DIAGNOSIS — R569 Unspecified convulsions: Secondary | ICD-10-CM | POA: Diagnosis not present

## 2016-05-28 DIAGNOSIS — H9319 Tinnitus, unspecified ear: Secondary | ICD-10-CM | POA: Diagnosis not present

## 2016-05-28 DIAGNOSIS — R42 Dizziness and giddiness: Secondary | ICD-10-CM | POA: Diagnosis not present

## 2016-05-28 DIAGNOSIS — R69 Illness, unspecified: Secondary | ICD-10-CM | POA: Diagnosis not present

## 2016-05-28 DIAGNOSIS — G40001 Localization-related (focal) (partial) idiopathic epilepsy and epileptic syndromes with seizures of localized onset, not intractable, with status epilepticus: Secondary | ICD-10-CM | POA: Diagnosis not present

## 2016-05-28 DIAGNOSIS — R251 Tremor, unspecified: Secondary | ICD-10-CM | POA: Diagnosis not present

## 2016-05-28 DIAGNOSIS — K229 Disease of esophagus, unspecified: Secondary | ICD-10-CM | POA: Diagnosis not present

## 2016-05-28 DIAGNOSIS — Z0000 Encounter for general adult medical examination without abnormal findings: Secondary | ICD-10-CM | POA: Diagnosis not present

## 2016-05-28 DIAGNOSIS — K08409 Partial loss of teeth, unspecified cause, unspecified class: Secondary | ICD-10-CM | POA: Diagnosis not present

## 2016-06-14 ENCOUNTER — Ambulatory Visit (INDEPENDENT_AMBULATORY_CARE_PROVIDER_SITE_OTHER): Payer: Medicare HMO | Admitting: Adult Health

## 2016-06-14 ENCOUNTER — Encounter: Payer: Self-pay | Admitting: Adult Health

## 2016-06-14 VITALS — BP 120/70 | Temp 98.1°F | Ht 78.0 in | Wt 201.7 lb

## 2016-06-14 DIAGNOSIS — R569 Unspecified convulsions: Secondary | ICD-10-CM

## 2016-06-14 DIAGNOSIS — E049 Nontoxic goiter, unspecified: Secondary | ICD-10-CM

## 2016-06-14 DIAGNOSIS — Z0000 Encounter for general adult medical examination without abnormal findings: Secondary | ICD-10-CM

## 2016-06-14 DIAGNOSIS — J0140 Acute pansinusitis, unspecified: Secondary | ICD-10-CM

## 2016-06-14 LAB — CBC WITH DIFFERENTIAL/PLATELET
BASOS ABS: 0 10*3/uL (ref 0.0–0.1)
Basophils Relative: 0.2 % (ref 0.0–3.0)
Eosinophils Absolute: 0 10*3/uL (ref 0.0–0.7)
Eosinophils Relative: 0.3 % (ref 0.0–5.0)
HCT: 42 % (ref 39.0–52.0)
HEMOGLOBIN: 14 g/dL (ref 13.0–17.0)
LYMPHS ABS: 2.8 10*3/uL (ref 0.7–4.0)
Lymphocytes Relative: 35.8 % (ref 12.0–46.0)
MCHC: 33.5 g/dL (ref 30.0–36.0)
MCV: 87.1 fl (ref 78.0–100.0)
Monocytes Absolute: 0.6 10*3/uL (ref 0.1–1.0)
Monocytes Relative: 7.6 % (ref 3.0–12.0)
NEUTROS PCT: 56.1 % (ref 43.0–77.0)
Neutro Abs: 4.3 10*3/uL (ref 1.4–7.7)
Platelets: 291 10*3/uL (ref 150.0–400.0)
RBC: 4.82 Mil/uL (ref 4.22–5.81)
RDW: 12.9 % (ref 11.5–15.5)
WBC: 7.7 10*3/uL (ref 4.0–10.5)

## 2016-06-14 LAB — HEPATIC FUNCTION PANEL
ALT: 12 U/L (ref 0–53)
AST: 21 U/L (ref 0–37)
Albumin: 4.5 g/dL (ref 3.5–5.2)
Alkaline Phosphatase: 63 U/L (ref 39–117)
Bilirubin, Direct: 0.1 mg/dL (ref 0.0–0.3)
Total Bilirubin: 0.6 mg/dL (ref 0.2–1.2)
Total Protein: 7.4 g/dL (ref 6.0–8.3)

## 2016-06-14 LAB — POC URINALSYSI DIPSTICK (AUTOMATED)
Bilirubin, UA: NEGATIVE
Blood, UA: NEGATIVE
Glucose, UA: NEGATIVE
Ketones, UA: NEGATIVE
Leukocytes, UA: NEGATIVE
Nitrite, UA: NEGATIVE
PROTEIN UA: NEGATIVE
SPEC GRAV UA: 1.025 (ref 1.030–1.035)
UROBILINOGEN UA: 0.2 (ref ?–2.0)
pH, UA: 7.5 (ref 5.0–8.0)

## 2016-06-14 LAB — HEMOGLOBIN A1C: Hgb A1c MFr Bld: 5.9 % (ref 4.6–6.5)

## 2016-06-14 LAB — LIPID PANEL
CHOL/HDL RATIO: 3
CHOLESTEROL: 172 mg/dL (ref 0–200)
HDL: 68.6 mg/dL (ref >39.00)
LDL CALC: 92 mg/dL (ref 0–99)
NonHDL: 103.13
Triglycerides: 57 mg/dL (ref 0.0–149.0)
VLDL: 11.4 mg/dL (ref 0.0–40.0)

## 2016-06-14 LAB — BASIC METABOLIC PANEL
BUN: 9 mg/dL (ref 6–23)
CO2: 29 mEq/L (ref 19–32)
Calcium: 9.8 mg/dL (ref 8.4–10.5)
Chloride: 103 mEq/L (ref 96–112)
Creatinine, Ser: 1.08 mg/dL (ref 0.40–1.50)
GFR: 96.4 mL/min (ref >60.00)
GLUCOSE: 88 mg/dL (ref 70–99)
Potassium: 4.3 mEq/L (ref 3.5–5.1)
SODIUM: 136 meq/L (ref 135–145)

## 2016-06-14 LAB — TSH: TSH: 0.84 u[IU]/mL (ref 0.35–4.50)

## 2016-06-14 LAB — PSA: PSA: 0.83 ng/mL (ref 0.10–4.00)

## 2016-06-14 MED ORDER — DOXYCYCLINE HYCLATE 100 MG PO CAPS: 100.0000 mg | ORAL_CAPSULE | Freq: Two times a day (BID) | ORAL | 0 refills | Status: DC

## 2016-06-14 NOTE — Progress Notes (Signed)
Subjective:    Patient ID: Burnard LeighSharif I Grantz, male    DOB: April 06, 1975, 41 y.o.   MRN: [id number]  HPI  Patient presents for yearly preventative medicine examination. He is a pleasant 41 year old male who  has a past medical history of Alcohol abuse; Depression; Hypertension; Migraines; Schizophrenia (HCC); and Seizure (HCC). Both his father and mother at this visit is that they are to primary caregivers [facility].  All immunizations and health maintenance protocols were reviewed with the patient and needed orders were placed.  Appropriate screening laboratory values were ordered for the patient including screening of hyperlipidemia, renal function and hepatic function. If indicated by BPH, a PSA was ordered.  Medication reconciliation,  past medical history, social history, problem list and allergies were reviewed in detail with the patient  Goals were established with regard to weight loss, exercise, and  diet in compliance with medications  His mother reports today that their home health nurse believes that [name] may be experiencing small she seizures while at home. His mother reports that [place] often stares "off into space, it is like he is just blanking out." He is on Depakote extended release 500 mg tablet. But family do not think that he is taking this medication on a consistent basis.  Family also reports that [name] is not going to psychiatrist. He does not believe he needs to go feels as though he is controlled on his current regimen movement of medications, although he is not compliant.  At this visit today she relates only acute complaint is that of upper respiratory infection. His symptoms include a productive hacking cough, sinus pain and pressure, nasal discharge, and congestion. He is unable to tell me how long this has been going on. He does not feel like he's had any fevers   Review of Systems  Constitutional: Positive for appetite change and fatigue. Negative for chills  and fever.  HENT: Positive for congestion, rhinorrhea, sinus pain and sinus pressure. Negative for sore throat and trouble swallowing.   Eyes: Negative.   Respiratory: Positive for cough and chest tightness. Negative for shortness of breath and wheezing.   Cardiovascular: Negative.   Gastrointestinal: Negative.   Endocrine: Negative.   Genitourinary: Negative.   Musculoskeletal: Negative.   Skin: Negative.   Allergic/Immunologic: Negative.   Neurological: Negative.   Hematological: Negative.   Psychiatric/Behavioral: Negative.   All other systems reviewed and are negative.  Past Medical History:  Diagnosis Date  . Alcohol abuse   . Depression   . Hypertension   . Migraines   . Schizophrenia (HCC)   . Seizure (HCC)    alcoho    Social History   Social History  . Marital status: Single    Spouse name: N/A  . Number of children: 3  . Years of education: N/A   Occupational History  . Unemployed    Social History Main Topics  . Smoking status: Current Every Day Smoker    Packs/day: 1.00    Types: Cigarettes  . Smokeless tobacco: Never Used  . Alcohol use Yes  . Drug use: Yes    Types: Marijuana  . Sexual activity: Not on file   Other Topics Concern  . Not on file   Social History Narrative  . No narrative on file    Past Surgical History:  Procedure Laterality Date  . WISDOM TOOTH EXTRACTION      Family History  Problem Relation Age of Onset  . Diabetes Mother   .  Hypertension Mother   . Hyperlipidemia Mother   . Anemia Mother   . Heart murmur Mother   . Colon polyps Maternal Uncle   . Heart disease Maternal Uncle   . Celiac disease Maternal Grandmother   . Diabetes Maternal Grandmother   . Diabetes Maternal Grandfather     No Known Allergies  Current Outpatient Prescriptions on File Prior to Visit  Medication Sig Dispense Refill  . divalproex (DEPAKOTE ER) 500 MG 24 hr tablet Take 1,000 mg by mouth at bedtime.     . Lidocaine, Anorectal,  (RECTICARE) 5 % CREA Apply to rectum 3-4 times a day as needed.    Marland Kitchen omeprazole (PRILOSEC) 40 MG capsule Take 1 capsule (40 mg total) by mouth daily. 90 capsule 3  . polyethylene glycol powder (GLYCOLAX/MIRALAX) powder Take 17 grams in 8 oz if water or juice daily. 527 g 1  . ranitidine (ZANTAC) 150 MG tablet Take 1 tablet (150 mg total) by mouth 2 (two) times daily. 60 tablet 0  . risperiDONE (RISPERDAL) 3 MG tablet Take 3 mg by mouth at bedtime.    . sucralfate (CARAFATE) 1 GM/10ML suspension Take 10 mLs (1 g total) by mouth 4 (four) times daily -  with meals and at bedtime. 420 mL 0   No current facility-administered medications on file prior to visit.     BP 120/70 (BP Location: Left Arm, Patient Position: Sitting, Cuff Size: Normal)   Temp 98.1 F (36.7 C) (Oral)   Ht 6\' 6"  (1.981 m)   Wt 201 lb 11.2 oz (91.5 kg)   BMI 23.31 kg/m       Objective:   Physical Exam  Constitutional: He is oriented to person, place, and time. Vital signs are normal. He appears well-developed and well-nourished. No distress.  HENT:  Head: Normocephalic and atraumatic.  Right Ear: Hearing, tympanic membrane, external ear and ear canal normal.  Left Ear: Hearing, tympanic membrane, external ear and ear canal normal.  Nose: Mucosal edema and rhinorrhea present. Right sinus exhibits maxillary sinus tenderness and frontal sinus tenderness. Left sinus exhibits maxillary sinus tenderness and frontal sinus tenderness.  Mouth/Throat: Uvula is midline, oropharynx is clear and moist and mucous membranes are normal. No oropharyngeal exudate.  Eyes: Conjunctivae and EOM are normal. Pupils are equal, round, and reactive to light. Right eye exhibits no discharge. Left eye exhibits no discharge. No scleral icterus.  Neck: Normal range of motion. Neck supple. No JVD present. No tracheal deviation present. Thyromegaly (Right lobe) present. No thyroid mass present.  Cardiovascular: Normal rate, regular rhythm, normal  heart sounds and intact distal pulses.  Exam reveals no gallop and no friction rub.   No murmur heard. Pulmonary/Chest: Effort normal and breath sounds normal. No stridor. No respiratory distress. He has no wheezes. He has no rales. He exhibits no tenderness.  Abdominal: Soft. Bowel sounds are normal. He exhibits no distension and no mass. There is no tenderness. There is no rebound and no guarding.  Genitourinary: Prostate normal. Rectal exam shows guaiac negative stool.  Musculoskeletal: Normal range of motion. He exhibits no edema or deformity.  Lymphadenopathy:    He has no cervical adenopathy.  Neurological: He is alert and oriented to person, place, and time. He has normal reflexes. He displays normal reflexes. No cranial nerve deficit or sensory deficit. He exhibits normal muscle tone. Coordination normal.  Skin: Skin is warm and dry. No rash noted. He is not diaphoretic. No erythema. No pallor.  Psychiatric: He has a  normal mood and affect. Judgment and thought content normal. His speech is delayed. Cognition and memory are normal. He is inattentive.  Nursing note and vitals reviewed.     Assessment & Plan:  1. Routine general medical examination at a health care facility - Basic metabolic panel - CBC with Differential/Platelet - Hepatic function panel - Hemoglobin A1c - Lipid panel - POCT Urinalysis Dipstick (Automated) - TSH - PSA - Follow up in one year for CPE or sooner if needed 2. Acute non-recurrent pansinusitis - doxycycline (VIBRAMYCIN) 100 MG capsule; Take 1 capsule (100 mg total) by mouth 2 (two) times daily.  Dispense: 14 capsule; Refill: 0  3. Seizures (HCC)  - Valproic Acid Level - Ambulatory referral to Neurology  4. Enlarged thyroid  - US THYROID; Future  Shirline Frees, NP

## 2016-06-14 NOTE — Patient Instructions (Addendum)
It was great seeing you today   1. I have sent in a prescription for doxycycline for your sinus infection   2. Somoene will call you to schedule your ultra sound of the thyroid  3. I will call about the labs that were drawn today   4. Someone from neurology will call you to schedule an appointment    Please go see your mental health counselor   Follow up with me in one year or sooner if needed

## 2016-06-15 LAB — VALPROIC ACID LEVEL: Valproic Acid Lvl: <12.5 ug/mL — ABNORMAL LOW (ref 50.0–100.0)

## 2016-06-18 ENCOUNTER — Encounter: Payer: Self-pay | Admitting: Neurology

## 2016-06-18 DIAGNOSIS — R569 Unspecified convulsions: Secondary | ICD-10-CM | POA: Insufficient documentation

## 2016-07-05 ENCOUNTER — Other Ambulatory Visit: Payer: Medicare HMO

## 2016-07-10 ENCOUNTER — Ambulatory Visit
Admission: RE | Admit: 2016-07-10 | Discharge: 2016-07-10 | Disposition: A | Discharge status: Home / Self Care | Payer: Medicare HMO | Source: Ambulatory Visit | Attending: Adult Health | Admitting: Adult Health

## 2016-07-10 DIAGNOSIS — Z8639 Personal history of other endocrine, nutritional and metabolic disease: Secondary | ICD-10-CM | POA: Diagnosis not present

## 2016-07-10 DIAGNOSIS — E049 Nontoxic goiter, unspecified: Secondary | ICD-10-CM

## 2016-07-16 ENCOUNTER — Ambulatory Visit (INDEPENDENT_AMBULATORY_CARE_PROVIDER_SITE_OTHER): Payer: Medicare HMO | Admitting: Adult Health

## 2016-07-16 ENCOUNTER — Encounter: Payer: Self-pay | Admitting: Adult Health

## 2016-07-16 VITALS — BP 110/76 | HR 99 | Temp 98.2°F | Ht 78.0 in | Wt 201.2 lb

## 2016-07-16 DIAGNOSIS — L0291 Cutaneous abscess, unspecified: Secondary | ICD-10-CM

## 2016-07-16 MED ORDER — DOXYCYCLINE HYCLATE 100 MG PO CAPS: 100.0000 mg | ORAL_CAPSULE | Freq: Two times a day (BID) | ORAL | 0 refills | Status: DC

## 2016-07-16 NOTE — Progress Notes (Signed)
Pre visit review using our clinic review tool, if applicable. No additional management support is needed unless otherwise documented below in the visit note. 

## 2016-07-16 NOTE — Progress Notes (Signed)
Subjective:    Patient ID: Alfred [name], male    DOB: 05/28/1975, 41 y.o.   MRN: [id number]  HPI  41 year old male who  has a past medical history of Alcohol abuse; Depression; Hypertension; Migraines; Schizophrenia (HCC); and Seizure (HCC). He presents to the office today for the acute complaint of right ear pain that started one week ago. He reports swelling, redness and drainage. He denies any trauma to the area.   Does not have pierced ears    Review of Systems  See HPI   Past Medical History:  Diagnosis Date  . Alcohol abuse   . Depression   . Hypertension   . Migraines   . Schizophrenia (HCC)   . Seizure (HCC)    alcoho    Social History   Social History  . Marital status: Single    Spouse name: N/A  . Number of children: 3  . Years of education: N/A   Occupational History  . Unemployed    Social History Main Topics  . Smoking status: Current Every Day Smoker    Packs/day: 1.00    Types: Cigarettes  . Smokeless tobacco: Never Used  . Alcohol use Yes  . Drug use: Yes    Types: Marijuana  . Sexual activity: Not on file   Other Topics Concern  . Not on file   Social History Narrative  . No narrative on file    Past Surgical History:  Procedure Laterality Date  . WISDOM TOOTH EXTRACTION      Family History  Problem Relation Age of Onset  . Diabetes Mother   . Hypertension Mother   . Hyperlipidemia Mother   . Anemia Mother   . Heart murmur Mother   . Colon polyps Maternal Uncle   . Heart disease Maternal Uncle   . Celiac disease Maternal Grandmother   . Diabetes Maternal Grandmother   . Diabetes Maternal Grandfather     No Known Allergies  Current Outpatient Prescriptions on File Prior to Visit  Medication Sig Dispense Refill  . divalproex (DEPAKOTE ER) 500 MG 24 hr tablet Take 1,000 mg by mouth at bedtime.     . Lidocaine, Anorectal, (RECTICARE) 5 % CREA Apply to rectum 3-4 times a day as needed.    Marland Kitchen omeprazole (PRILOSEC) 40 MG  capsule Take 1 capsule (40 mg total) by mouth daily. 90 capsule 3  . polyethylene glycol powder (GLYCOLAX/MIRALAX) powder Take 17 grams in 8 oz if water or juice daily. 527 g 1  . ranitidine (ZANTAC) 150 MG tablet Take 1 tablet (150 mg total) by mouth 2 (two) times daily. 60 tablet 0  . risperiDONE (RISPERDAL) 3 MG tablet Take 3 mg by mouth at bedtime.    . sucralfate (CARAFATE) 1 GM/10ML suspension Take 10 mLs (1 g total) by mouth 4 (four) times daily -  with meals and at bedtime. 420 mL 0   No current facility-administered medications on file prior to visit.     BP 110/76 (BP Location: Left Arm, Patient Position: Sitting, Cuff Size: Large)   Pulse 99   Temp 98.2 F (36.8 C) (Oral)   Ht  (1.981 m)   Wt 201 lb 3.2 oz (91.3 kg)   SpO2 98%   BMI 23.25 kg/m       Objective:   Physical Exam  Constitutional: He is oriented to person, place, and time. He appears well-developed and well-nourished. No distress.  Neurological: He is alert and oriented to  person, place, and time.  Skin: Skin is warm and dry. He is not diaphoretic. There is erythema.  Abscess on right ear lobe    Psychiatric: He has a normal mood and affect. His behavior is normal. Judgment and thought content normal.  Nursing note and vitals reviewed.     Assessment & Plan:  1. Abscess Procedure:  Incision and drainage of abscess Risks, benefits, and alternatives explained and verbal consent obtained. Time out conducted. Surface cleaned with alcohol.  2 cc  lidocaine with epinephine infiltrated around abscess. Adequate anesthesia ensured. Area prepped and draped in a sterile fashion. #11 blade used to make a stab incision into abscess. Pus expressed with pressure. Further purulence expressed. Hemostasis achieved. Pt stable. Aftercare and follow-up advised.  - doxycycline (VIBRAMYCIN) 100 MG capsule; Take 1 capsule (100 mg total) by mouth 2 (two) times daily.  Dispense: 20 capsule; Refill: 0  Shirline Frees, NP

## 2016-07-23 ENCOUNTER — Ambulatory Visit: Payer: Medicare HMO

## 2016-07-24 ENCOUNTER — Ambulatory Visit (INDEPENDENT_AMBULATORY_CARE_PROVIDER_SITE_OTHER): Payer: Medicare HMO

## 2016-07-24 ENCOUNTER — Telehealth: Payer: Self-pay | Admitting: Adult Health

## 2016-07-24 ENCOUNTER — Encounter: Payer: Self-pay | Admitting: Adult Health

## 2016-07-24 ENCOUNTER — Ambulatory Visit (INDEPENDENT_AMBULATORY_CARE_PROVIDER_SITE_OTHER): Payer: Medicare HMO | Admitting: Adult Health

## 2016-07-24 VITALS — BP 124/80 | Temp 98.3°F | Ht 78.0 in | Wt 206.3 lb

## 2016-07-24 DIAGNOSIS — M79641 Pain in right hand: Secondary | ICD-10-CM | POA: Diagnosis not present

## 2016-07-24 DIAGNOSIS — M7989 Other specified soft tissue disorders: Secondary | ICD-10-CM | POA: Diagnosis not present

## 2016-07-24 DIAGNOSIS — S62346A Nondisplaced fracture of base of fifth metacarpal bone, right hand, initial encounter for closed fracture: Secondary | ICD-10-CM | POA: Diagnosis not present

## 2016-07-24 DIAGNOSIS — S62339A Displaced fracture of neck of unspecified metacarpal bone, initial encounter for closed fracture: Secondary | ICD-10-CM

## 2016-07-24 NOTE — Patient Instructions (Signed)
Please go to the [facility] for the x rays  [address] [place], [place], [place] [address]

## 2016-07-24 NOTE — Telephone Encounter (Signed)
Spoke to [employer] - unable to get a hold of his mother. He has a non displaced boxers fracture. I am going to refer him to sports medicine to have this splinted.

## 2016-07-24 NOTE — Progress Notes (Signed)
Subjective:    Patient ID: Alfred [name], male    DOB: 1976/03/26, 41 y.o.   MRN: [id number]  HPI  41 year old male who  has a past medical history of Alcohol abuse; Depression; Hypertension; Migraines; Schizophrenia (HCC); and Seizure (HCC). He presents to the office today with his parents for an acute complaint of right hand pain and swelling x 3 days. He is unwilling to say what happened but reports " I woke up three days ago and it was like this. I think I may have punched something in my sleep." He continues " I know I didn't punch anyone and no one harmed me."   He has been using ice and motrin to help with the pain.   He reports pain in multiple knuckles,dorsal part of right hand, wrist and forearm.    Review of Systems  Constitutional: Negative.   Musculoskeletal: Positive for arthralgias and joint swelling.  Skin: Negative.   All other systems reviewed and are negative.  Past Medical History:  Diagnosis Date  . Alcohol abuse   . Depression   . Hypertension   . Migraines   . Schizophrenia (HCC)   . Seizure (HCC)    alcoho    Social History   Social History  . Marital status: Single    Spouse name: N/A  . Number of children: 3  . Years of education: N/A   Occupational History  . Unemployed    Social History Main Topics  . Smoking status: Current Every Day Smoker    Packs/day: 1.00    Types: Cigarettes  . Smokeless tobacco: Never Used  . Alcohol use Yes  . Drug use: Yes    Types: Marijuana  . Sexual activity: Not on file   Other Topics Concern  . Not on file   Social History Narrative  . No narrative on file    Past Surgical History:  Procedure Laterality Date  . WISDOM TOOTH EXTRACTION      Family History  Problem Relation Age of Onset  . Diabetes Mother   . Hypertension Mother   . Hyperlipidemia Mother   . Anemia Mother   . Heart murmur Mother   . Colon polyps Maternal Uncle   . Heart disease Maternal Uncle   . Celiac disease  Maternal Grandmother   . Diabetes Maternal Grandmother   . Diabetes Maternal Grandfather     No Known Allergies  Current Outpatient Prescriptions on File Prior to Visit  Medication Sig Dispense Refill  . divalproex (DEPAKOTE ER) 500 MG 24 hr tablet Take 1,000 mg by mouth at bedtime.     Marland Kitchen doxycycline (VIBRAMYCIN) 100 MG capsule Take 1 capsule (100 mg total) by mouth 2 (two) times daily. 20 capsule 0  . Lidocaine, Anorectal, (RECTICARE) 5 % CREA Apply to rectum 3-4 times a day as needed.    Marland Kitchen omeprazole (PRILOSEC) 40 MG capsule Take 1 capsule (40 mg total) by mouth daily. 90 capsule 3  . polyethylene glycol powder (GLYCOLAX/MIRALAX) powder Take 17 grams in 8 oz if water or juice daily. 527 g 1  . ranitidine (ZANTAC) 150 MG tablet Take 1 tablet (150 mg total) by mouth 2 (two) times daily. 60 tablet 0  . risperiDONE (RISPERDAL) 3 MG tablet Take 3 mg by mouth at bedtime.    . sucralfate (CARAFATE) 1 GM/10ML suspension Take 10 mLs (1 g total) by mouth 4 (four) times daily -  with meals and at bedtime. 420 mL 0  No current facility-administered medications on file prior to visit.     BP 124/80 (BP Location: Left Arm, Patient Position: Sitting, Cuff Size: Normal)   Temp 98.3 F (36.8 C) (Oral)   Ht  (1.981 m)   Wt 206 lb 4.8 oz (93.6 kg)   BMI 23.84 kg/m       Objective:   Physical Exam  Constitutional: He is oriented to person, place, and time. He appears well-developed and well-nourished. No distress.  Cardiovascular: Normal rate, regular rhythm, normal heart sounds and intact distal pulses.  Exam reveals no gallop and no friction rub.   No murmur heard. Pulmonary/Chest: Effort normal and breath sounds normal. No respiratory distress. He has no wheezes. He has no rales. He exhibits no tenderness.  Musculoskeletal: He exhibits edema and tenderness.  He is unable to make a fist and has decreased grip strength. No bruising noted but has significant swelling across knuckles and top  of right hand. No snuff box tenderness. No deformity noticed due to swelling. He has good cap refill and + 3 pulses   Neurological: He is alert and oriented to person, place, and time.  Skin: Skin is warm and dry. No rash noted. He is not diaphoretic. No erythema. No pallor.  Psychiatric: He has a normal mood and affect. His behavior is normal. Judgment and thought content normal.  Nursing note and vitals reviewed.     Assessment & Plan:  1. Right hand pain - possible boxers fracture? Will get x rays and then send to orthopedics if needed - DG Hand Complete Right; Future - DG Wrist Complete Right; Future - DG Forearm Right; Future  Shirline Frees, NP

## 2016-07-25 ENCOUNTER — Telehealth: Payer: Self-pay | Admitting: Adult Health

## 2016-07-25 DIAGNOSIS — S62306A Unspecified fracture of fifth metacarpal bone, right hand, initial encounter for closed fracture: Secondary | ICD-10-CM | POA: Diagnosis not present

## 2016-07-25 NOTE — Telephone Encounter (Signed)
Please advise 

## 2016-07-25 NOTE — Telephone Encounter (Signed)
Pt mom called back because Dr Darrick Penna cannot see pt for a couple of weeks. Pt declined to accept an appointment  At that office in a couple of weeks/  Pt states he needs an appt today. Can [facility] refer somewhere else?

## 2016-07-25 NOTE — Telephone Encounter (Signed)
Please disregard.  Alfred [name] is assisting pt in trying to get into another dr.

## 2016-08-08 DIAGNOSIS — S62306D Unspecified fracture of fifth metacarpal bone, right hand, subsequent encounter for fracture with routine healing: Secondary | ICD-10-CM | POA: Diagnosis not present

## 2016-08-21 ENCOUNTER — Ambulatory Visit (INDEPENDENT_AMBULATORY_CARE_PROVIDER_SITE_OTHER): Payer: Medicare HMO | Admitting: Neurology

## 2016-08-21 ENCOUNTER — Encounter: Payer: Self-pay | Admitting: Neurology

## 2016-08-21 VITALS — BP 120/76 | HR 90 | Ht 78.0 in | Wt 192.0 lb

## 2016-08-21 DIAGNOSIS — R258 Other abnormal involuntary movements: Secondary | ICD-10-CM

## 2016-08-21 DIAGNOSIS — R404 Transient alteration of awareness: Secondary | ICD-10-CM | POA: Diagnosis not present
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
## 2016-08-21 NOTE — Patient Instructions (Addendum)
1. Schedule MRI brain with and without contrast 2. Schedule 1-hour sleep-deprived EEG 3. Continue all your medications 4. [facility] Dep[facility]ote 5. Follow-up after tests

## 2016-08-27 ENCOUNTER — Ambulatory Visit (INDEPENDENT_AMBULATORY_CARE_PROVIDER_SITE_OTHER): Payer: Medicare HMO | Admitting: Neurology

## 2016-08-27 DIAGNOSIS — R404 Transient alteration of awareness: Secondary | ICD-10-CM | POA: Diagnosis not present

## 2016-08-27 DIAGNOSIS — R258 Other abnormal involuntary movements: Secondary | ICD-10-CM

## 2016-08-28 DIAGNOSIS — R404 Transient alteration of awareness: Secondary | ICD-10-CM | POA: Insufficient documentation

## 2016-08-28 DIAGNOSIS — R258 Other abnormal involuntary movements: Secondary | ICD-10-CM | POA: Insufficient documentation

## 2016-08-29 DIAGNOSIS — S62306D Unspecified fracture of fifth metacarpal bone, right hand, subsequent encounter for fracture with routine healing: Secondary | ICD-10-CM | POA: Diagnosis not present

## 2016-09-02 NOTE — Procedures (Signed)
ELECTROENCEPHALOGRAM REPORT  Date of Study: 08/27/2016  Patient's Name: Alfred [name] MRN: [id number] Date of Birth: Aug 30, 1975  Referring Provider: Dr. Patrcia DollyKaren Toma Arts  Clinical History: This is a [age] year old man with staring spells, forgetfulness, body jerks. EEG for classification.  Medications: Depakote, Seroquel, Vibramycin, Recticar, Zantac  Technical Summary: A multichannel digital 1-hour sleep-deprived EEG recording measured by the international 10-20 system with electrodes applied with paste and impedances below 5000 ohms performed in our laboratory with EKG monitoring in an awake and drowsy patient.  Hyperventilation and photic stimulation were performed.  The digital EEG was referentially recorded, reformatted, and digitally filtered in a variety of bipolar and referential montages for optimal display.    Description: The patient is awake and drowsy during the recording.  During maximal wakefulness, there is a symmetric, medium voltage 10-10.5 Hz posterior dominant rhythm that attenuates with eye opening.  The record is symmetric.  During drowsiness and sleep, there is an increase in theta slowing of the background, with shifting asymmetry over the bilateral temporal regions, left greater than right. There is electrode artifact over T3 and muscle artifact over the right temporal region, at times obscuring EEG. In between artifact, there were no abnormalities seen. Deeper stages of sleep were not captured. Hyperventilation and photic stimulation did not elicit any abnormalities.  There were no epileptiform discharges or electrographic seizures seen.    EKG lead was unremarkable.  Impression: This 1-hour awake and drowsy EEG is within normal limits.  Clinical Correlation: A normal EEG does not exclude a clinical diagnosis of epilepsy.  If further clinical questions remain, prolonged EEG may be helpful.  Clinical correlation is advised.   Patrcia DollyKaren Dierre Crevier, M.D.

## 2016-09-03 ENCOUNTER — Other Ambulatory Visit: Payer: Self-pay | Admitting: Neurology

## 2016-09-03 DIAGNOSIS — T1590XA Foreign body on external eye, part unspecified, unspecified eye, initial encounter: Secondary | ICD-10-CM

## 2016-09-04 ENCOUNTER — Telehealth: Payer: Self-pay

## 2016-09-04 NOTE — Telephone Encounter (Signed)
-----   Message from Van ClinesKaren M Aquino, MD sent at 09/02/2016  9:37 AM EDT ----- Pls let patient know the EEG is normal. Proceed with MRI brain and 48-hour EEG as planned. Thanks

## 2016-09-04 NOTE — Telephone Encounter (Signed)
LMOM relaying message below.  

## 2016-09-17 ENCOUNTER — Ambulatory Visit
Admission: RE | Admit: 2016-09-17 | Discharge: 2016-09-17 | Disposition: A | Discharge status: Home / Self Care | Payer: Medicare HMO | Source: Ambulatory Visit | Attending: Neurology | Admitting: Neurology

## 2016-09-17 DIAGNOSIS — R569 Unspecified convulsions: Secondary | ICD-10-CM | POA: Diagnosis not present

## 2016-09-17 DIAGNOSIS — Z01818 Encounter for other preprocedural examination: Secondary | ICD-10-CM | POA: Diagnosis not present

## 2016-09-17 DIAGNOSIS — T1590XA Foreign body on external eye, part unspecified, unspecified eye, initial encounter: Secondary | ICD-10-CM

## 2016-09-17 DIAGNOSIS — R258 Other abnormal involuntary movements: Secondary | ICD-10-CM

## 2016-09-17 DIAGNOSIS — R404 Transient alteration of awareness: Secondary | ICD-10-CM

## 2016-09-17 MED ORDER — GADOBENATE DIMEGLUMINE 529 MG/ML IV SOLN
19.0000 mL | Freq: Once | INTRAVENOUS | Status: AC | PRN
  Administered 2016-09-17: 19 mL via INTRAVENOUS

## 2016-09-18 DIAGNOSIS — R69 Illness, unspecified: Secondary | ICD-10-CM | POA: Diagnosis not present

## 2016-09-24 ENCOUNTER — Other Ambulatory Visit: Payer: Self-pay

## 2016-09-24 ENCOUNTER — Telehealth: Payer: Self-pay

## 2016-09-24 DIAGNOSIS — R569 Unspecified convulsions: Secondary | ICD-10-CM

## 2016-09-24 DIAGNOSIS — S62306D Unspecified fracture of fifth metacarpal bone, right hand, subsequent encounter for fracture with routine healing: Secondary | ICD-10-CM | POA: Diagnosis not present

## 2016-09-24 NOTE — Telephone Encounter (Signed)
Spoke with pt's mother relaying message below.  Orders for EEG placed.

## 2016-09-24 NOTE — Telephone Encounter (Signed)
-----   Message from Van ClinesKaren M Aquino, MD sent at 09/24/2016 10:05 AM EDT ----- Pls let him know the MRI brain is normal, no evidence of tumor, stroke, or bleed. Proceed with 48-hour EEG as discussed, then follow-up after that. Thanks

## 2016-10-23 DIAGNOSIS — R69 Illness, unspecified: Secondary | ICD-10-CM | POA: Diagnosis not present

## 2016-12-06 ENCOUNTER — Other Ambulatory Visit: Payer: Self-pay

## 2016-12-06 ENCOUNTER — Inpatient Hospital Stay (HOSPITAL_COMMUNITY)
Admission: EM | Admit: 2016-12-06 | Discharge: 2016-12-08 | DRG: 897 | Disposition: A | Discharge status: Home / Self Care | Payer: Medicare HMO | Attending: Internal Medicine | Admitting: Internal Medicine

## 2016-12-06 ENCOUNTER — Emergency Department (HOSPITAL_COMMUNITY)
Admission: EM | Admit: 2016-12-06 | Discharge: 2016-12-06 | Disposition: A | Discharge status: Home / Self Care | Payer: Medicare HMO | Source: Home / Self Care | Attending: Emergency Medicine | Admitting: Emergency Medicine

## 2016-12-06 ENCOUNTER — Encounter (HOSPITAL_COMMUNITY): Payer: Self-pay | Admitting: *Deleted

## 2016-12-06 ENCOUNTER — Encounter (HOSPITAL_COMMUNITY): Payer: Self-pay | Admitting: Emergency Medicine

## 2016-12-06 ENCOUNTER — Emergency Department (HOSPITAL_COMMUNITY): Payer: Medicare HMO

## 2016-12-06 DIAGNOSIS — R443 Hallucinations, unspecified: Secondary | ICD-10-CM | POA: Diagnosis not present

## 2016-12-06 DIAGNOSIS — Z5321 Procedure and treatment not carried out due to patient leaving prior to being seen by health care provider: Secondary | ICD-10-CM | POA: Insufficient documentation

## 2016-12-06 DIAGNOSIS — F209 Schizophrenia, unspecified: Secondary | ICD-10-CM | POA: Diagnosis present

## 2016-12-06 DIAGNOSIS — R000 Tachycardia, unspecified: Secondary | ICD-10-CM | POA: Diagnosis not present

## 2016-12-06 DIAGNOSIS — R450 Nervousness: Secondary | ICD-10-CM | POA: Diagnosis not present

## 2016-12-06 DIAGNOSIS — G43909 Migraine, unspecified, not intractable, without status migrainosus: Secondary | ICD-10-CM | POA: Diagnosis present

## 2016-12-06 DIAGNOSIS — I10 Essential (primary) hypertension: Secondary | ICD-10-CM | POA: Diagnosis not present

## 2016-12-06 DIAGNOSIS — I300 Acute nonspecific idiopathic pericarditis: Secondary | ICD-10-CM | POA: Diagnosis not present

## 2016-12-06 DIAGNOSIS — Z8349 Family history of other endocrine, nutritional and metabolic diseases: Secondary | ICD-10-CM

## 2016-12-06 DIAGNOSIS — N179 Acute kidney failure, unspecified: Secondary | ICD-10-CM | POA: Diagnosis present

## 2016-12-06 DIAGNOSIS — F17200 Nicotine dependence, unspecified, uncomplicated: Secondary | ICD-10-CM | POA: Diagnosis present

## 2016-12-06 DIAGNOSIS — R9431 Abnormal electrocardiogram [ECG] [EKG]: Secondary | ICD-10-CM | POA: Diagnosis not present

## 2016-12-06 DIAGNOSIS — M6282 Rhabdomyolysis: Secondary | ICD-10-CM | POA: Diagnosis not present

## 2016-12-06 DIAGNOSIS — Z8371 Family history of colonic polyps: Secondary | ICD-10-CM

## 2016-12-06 DIAGNOSIS — E860 Dehydration: Secondary | ICD-10-CM | POA: Diagnosis present

## 2016-12-06 DIAGNOSIS — F23 Brief psychotic disorder: Secondary | ICD-10-CM | POA: Diagnosis present

## 2016-12-06 DIAGNOSIS — F14151 Cocaine abuse with cocaine-induced psychotic disorder with hallucinations: Secondary | ICD-10-CM

## 2016-12-06 DIAGNOSIS — R69 Illness, unspecified: Secondary | ICD-10-CM | POA: Diagnosis not present

## 2016-12-06 DIAGNOSIS — R1011 Right upper quadrant pain: Secondary | ICD-10-CM

## 2016-12-06 DIAGNOSIS — K2100 Gastro-esophageal reflux disease with esophagitis, without bleeding: Secondary | ICD-10-CM

## 2016-12-06 DIAGNOSIS — R5383 Other fatigue: Secondary | ICD-10-CM | POA: Diagnosis not present

## 2016-12-06 DIAGNOSIS — F419 Anxiety disorder, unspecified: Secondary | ICD-10-CM | POA: Diagnosis not present

## 2016-12-06 DIAGNOSIS — R0789 Other chest pain: Secondary | ICD-10-CM | POA: Diagnosis not present

## 2016-12-06 DIAGNOSIS — I319 Disease of pericardium, unspecified: Secondary | ICD-10-CM | POA: Diagnosis not present

## 2016-12-06 DIAGNOSIS — R079 Chest pain, unspecified: Secondary | ICD-10-CM | POA: Insufficient documentation

## 2016-12-06 DIAGNOSIS — Z833 Family history of diabetes mellitus: Secondary | ICD-10-CM | POA: Diagnosis not present

## 2016-12-06 DIAGNOSIS — R569 Unspecified convulsions: Secondary | ICD-10-CM | POA: Diagnosis not present

## 2016-12-06 DIAGNOSIS — F12159 Cannabis abuse with psychotic disorder, unspecified: Secondary | ICD-10-CM

## 2016-12-06 DIAGNOSIS — Z8379 Family history of other diseases of the digestive system: Secondary | ICD-10-CM

## 2016-12-06 DIAGNOSIS — R5381 Other malaise: Secondary | ICD-10-CM | POA: Diagnosis not present

## 2016-12-06 DIAGNOSIS — M7981 Nontraumatic hematoma of soft tissue: Secondary | ICD-10-CM | POA: Diagnosis present

## 2016-12-06 DIAGNOSIS — F1020 Alcohol dependence, uncomplicated: Secondary | ICD-10-CM

## 2016-12-06 DIAGNOSIS — F12151 Cannabis abuse with psychotic disorder with hallucinations: Secondary | ICD-10-CM | POA: Diagnosis not present

## 2016-12-06 DIAGNOSIS — F17210 Nicotine dependence, cigarettes, uncomplicated: Secondary | ICD-10-CM | POA: Diagnosis not present

## 2016-12-06 DIAGNOSIS — Z8249 Family history of ischemic heart disease and other diseases of the circulatory system: Secondary | ICD-10-CM | POA: Diagnosis not present

## 2016-12-06 LAB — CBC
HCT: 40.8 % (ref 39.0–52.0)
HEMATOCRIT: 42.4 % (ref 39.0–52.0)
HEMOGLOBIN: 13.7 g/dL (ref 13.0–17.0)
HEMOGLOBIN: 13.9 g/dL (ref 13.0–17.0)
MCH: 29 pg (ref 26.0–34.0)
MCH: 29.8 pg (ref 26.0–34.0)
MCHC: 32.8 g/dL (ref 30.0–36.0)
MCHC: 33.6 g/dL (ref 30.0–36.0)
MCV: 88.3 fL (ref 78.0–100.0)
MCV: 88.7 fL (ref 78.0–100.0)
Platelets: 187 10*3/uL (ref 150–400)
Platelets: 207 10*3/uL (ref 150–400)
RBC: 4.6 MIL/uL (ref 4.22–5.81)
RBC: 4.8 MIL/uL (ref 4.22–5.81)
RDW: 13.4 % (ref 11.5–15.5)
RDW: 13.7 % (ref 11.5–15.5)
WBC: 10.9 10*3/uL — ABNORMAL HIGH (ref 4.0–10.5)
WBC: 11.5 10*3/uL — ABNORMAL HIGH (ref 4.0–10.5)

## 2016-12-06 LAB — HEPATIC FUNCTION PANEL
ALT: 32 U/L (ref 17–63)
AST: 137 U/L — ABNORMAL HIGH (ref 15–41)
Albumin: 4.9 g/dL (ref 3.5–5.0)
Alkaline Phosphatase: 63 U/L (ref 38–126)
Bilirubin, Direct: 0.2 mg/dL (ref 0.1–0.5)
Indirect Bilirubin: 1 mg/dL — ABNORMAL HIGH (ref 0.3–0.9)
Total Bilirubin: 1.2 mg/dL (ref 0.3–1.2)
Total Protein: 8.3 g/dL — ABNORMAL HIGH (ref 6.5–8.1)

## 2016-12-06 LAB — CK
Total CK: 5070 U/L — ABNORMAL HIGH (ref 49–397)
Total CK: 10952 U/L — ABNORMAL HIGH (ref 49–397)

## 2016-12-06 LAB — URINALYSIS, ROUTINE W REFLEX MICROSCOPIC
Glucose, UA: NEGATIVE mg/dL
Ketones, ur: 5 mg/dL — AB
Leukocytes, UA: NEGATIVE
Nitrite: NEGATIVE
Protein, ur: >=300 mg/dL — AB
Specific Gravity, Urine: 1.028 (ref 1.005–1.030)
pH: 5 (ref 5.0–8.0)

## 2016-12-06 LAB — BASIC METABOLIC PANEL
ANION GAP: 11 (ref 5–15)
ANION GAP: 14 (ref 5–15)
BUN: 7 mg/dL (ref 6–20)
BUN: 14 mg/dL (ref 6–20)
CALCIUM: 10.1 mg/dL (ref 8.9–10.3)
CHLORIDE: 101 mmol/L (ref 101–111)
CHLORIDE: 104 mmol/L (ref 101–111)
CO2: 23 mmol/L (ref 22–32)
CO2: 25 mmol/L (ref 22–32)
Calcium: 8.9 mg/dL (ref 8.9–10.3)
Creatinine, Ser: 1.5 mg/dL — ABNORMAL HIGH (ref 0.61–1.24)
Creatinine, Ser: 1.34 mg/dL — ABNORMAL HIGH (ref 0.61–1.24)
GFR calc Af Amer: >60 mL/min (ref >60)
GFR calc non Af Amer: 56 mL/min — ABNORMAL LOW (ref >60)
GLUCOSE: 95 mg/dL (ref 65–99)
Glucose, Bld: 108 mg/dL — ABNORMAL HIGH (ref 65–99)
POTASSIUM: 3.7 mmol/L (ref 3.5–5.1)
POTASSIUM: 4.1 mmol/L (ref 3.5–5.1)
SODIUM: 138 mmol/L (ref 135–145)
Sodium: 140 mmol/L (ref 135–145)

## 2016-12-06 LAB — SEDIMENTATION RATE: SED RATE: 0 mm/h (ref 0–16)

## 2016-12-06 LAB — RAPID URINE DRUG SCREEN, HOSP PERFORMED
Amphetamines: NOT DETECTED
BARBITURATES: NOT DETECTED
Benzodiazepines: NOT DETECTED
Cocaine: POSITIVE — AB
Opiates: NOT DETECTED
Tetrahydrocannabinol: POSITIVE — AB

## 2016-12-06 LAB — I-STAT TROPONIN, ED
TROPONIN I, POC: 0.02 ng/mL (ref 0.00–0.08)
Troponin i, poc: 0.05 ng/mL (ref 0.00–0.08)

## 2016-12-06 LAB — D-DIMER, QUANTITATIVE: D-Dimer, Quant: 0.28 ug/mL-FEU (ref 0.00–0.50)

## 2016-12-06 LAB — ETHANOL: Alcohol, Ethyl (B): <5 mg/dL

## 2016-12-06 LAB — LIPASE, BLOOD: Lipase: 31 U/L (ref 11–51)

## 2016-12-06 LAB — TROPONIN I: TROPONIN I: 0.05 ng/mL — AB (ref <0.03)

## 2016-12-06 LAB — C-REACTIVE PROTEIN: CRP: 1.4 mg/dL — AB (ref <1.0)

## 2016-12-06 LAB — TSH: TSH: 1.37 u[IU]/mL (ref 0.350–4.500)

## 2016-12-06 MED ORDER — FOLIC ACID 1 MG PO TABS
1.0000 mg | ORAL_TABLET | Freq: Every day | ORAL | Status: DC
  Administered 2016-12-07 – 2016-12-08 (×2): 1 mg via ORAL
  Filled 2016-12-06 (×2): qty 1

## 2016-12-06 MED ORDER — SODIUM CHLORIDE 0.9% FLUSH
3.0000 mL | Freq: Two times a day (BID) | INTRAVENOUS | Status: DC
  Administered 2016-12-07: 3 mL via INTRAVENOUS

## 2016-12-06 MED ORDER — LORAZEPAM 2 MG/ML IJ SOLN: 0.0000 mg | Freq: Four times a day (QID) | INTRAMUSCULAR | Status: DC

## 2016-12-06 MED ORDER — SODIUM CHLORIDE 0.9 % IV SOLN
INTRAVENOUS | Status: AC
  Administered 2016-12-06 – 2016-12-07 (×4): via INTRAVENOUS

## 2016-12-06 MED ORDER — NICOTINE 21 MG/24HR TD PT24
21.0000 mg | MEDICATED_PATCH | Freq: Every day | TRANSDERMAL | Status: DC
  Administered 2016-12-06 – 2016-12-08 (×3): 21 mg via TRANSDERMAL
  Filled 2016-12-06 (×4): qty 1

## 2016-12-06 MED ORDER — LORAZEPAM 1 MG PO TABS: 0.0000 mg | ORAL_TABLET | Freq: Two times a day (BID) | ORAL | Status: DC

## 2016-12-06 MED ORDER — ADULT MULTIVITAMIN W/MINERALS CH
1.0000 | ORAL_TABLET | Freq: Every day | ORAL | Status: DC
  Administered 2016-12-06 – 2016-12-08 (×3): 1 via ORAL
  Filled 2016-12-06 (×3): qty 1

## 2016-12-06 MED ORDER — THIAMINE HCL 100 MG/ML IJ SOLN: 100.0000 mg | Freq: Every day | INTRAMUSCULAR | Status: DC

## 2016-12-06 MED ORDER — LORAZEPAM 2 MG/ML IJ SOLN
1.0000 mg | Freq: Four times a day (QID) | INTRAMUSCULAR | Status: DC | PRN
  Administered 2016-12-07: 1 mg via INTRAVENOUS
  Filled 2016-12-06: qty 1

## 2016-12-06 MED ORDER — ASPIRIN 325 MG PO TABS
650.0000 mg | ORAL_TABLET | Freq: Three times a day (TID) | ORAL | Status: DC
  Administered 2016-12-06 – 2016-12-07 (×4): 650 mg via ORAL
  Filled 2016-12-06 (×4): qty 2

## 2016-12-06 MED ORDER — VITAMIN B-1 100 MG PO TABS
100.0000 mg | ORAL_TABLET | Freq: Every day | ORAL | Status: DC
  Administered 2016-12-06 – 2016-12-08 (×3): 100 mg via ORAL
  Filled 2016-12-06 (×3): qty 1

## 2016-12-06 MED ORDER — COLCHICINE 0.6 MG PO TABS
0.6000 mg | ORAL_TABLET | Freq: Two times a day (BID) | ORAL | Status: DC
  Administered 2016-12-06 – 2016-12-07 (×2): 0.6 mg via ORAL
  Filled 2016-12-06 (×2): qty 1

## 2016-12-06 MED ORDER — RISPERIDONE 3 MG PO TABS
3.0000 mg | ORAL_TABLET | Freq: Every day | ORAL | Status: DC
  Administered 2016-12-06: 3 mg via ORAL
  Filled 2016-12-06 (×2): qty 1

## 2016-12-06 MED ORDER — SODIUM CHLORIDE 0.9 % IV BOLUS (SEPSIS)
1000.0000 mL | Freq: Once | INTRAVENOUS | Status: AC
  Administered 2016-12-06: 1000 mL via INTRAVENOUS

## 2016-12-06 MED ORDER — LORAZEPAM 1 MG PO TABS
1.0000 mg | ORAL_TABLET | Freq: Four times a day (QID) | ORAL | Status: DC | PRN
  Administered 2016-12-08: 1 mg via ORAL
  Filled 2016-12-06: qty 1

## 2016-12-06 MED ORDER — ONDANSETRON HCL 4 MG PO TABS: 4.0000 mg | ORAL_TABLET | Freq: Three times a day (TID) | ORAL | Status: DC | PRN

## 2016-12-06 MED ORDER — HEPARIN SODIUM (PORCINE) 5000 UNIT/ML IJ SOLN
5000.0000 [IU] | Freq: Three times a day (TID) | INTRAMUSCULAR | Status: DC
  Administered 2016-12-06 – 2016-12-08 (×6): 5000 [IU] via SUBCUTANEOUS
  Filled 2016-12-06 (×6): qty 1

## 2016-12-06 MED ORDER — FENTANYL CITRATE (PF) 100 MCG/2ML IJ SOLN
50.0000 ug | Freq: Once | INTRAMUSCULAR | Status: AC
  Administered 2016-12-06: 50 ug via INTRAVENOUS
  Filled 2016-12-06: qty 2

## 2016-12-06 MED ORDER — ASPIRIN 81 MG PO CHEW
324.0000 mg | CHEWABLE_TABLET | Freq: Once | ORAL | Status: AC
  Administered 2016-12-06: 324 mg via ORAL
  Filled 2016-12-06: qty 4

## 2016-12-06 MED ORDER — LORAZEPAM 1 MG PO TABS
0.0000 mg | ORAL_TABLET | Freq: Four times a day (QID) | ORAL | Status: DC
  Administered 2016-12-06: 2 mg via ORAL
  Filled 2016-12-06: qty 2

## 2016-12-06 MED ORDER — POLYETHYLENE GLYCOL 3350 17 G PO PACK: 17.0000 g | PACK | Freq: Every day | ORAL | Status: DC | PRN

## 2016-12-06 MED ORDER — RISPERIDONE 2 MG PO TBDP
2.0000 mg | ORAL_TABLET | Freq: Three times a day (TID) | ORAL | Status: DC | PRN
  Administered 2016-12-06: 2 mg via ORAL
  Filled 2016-12-06: qty 1

## 2016-12-06 MED ORDER — LORAZEPAM 2 MG/ML IJ SOLN: 0.0000 mg | Freq: Two times a day (BID) | INTRAMUSCULAR | Status: DC

## 2016-12-06 MED ORDER — DIVALPROEX SODIUM ER 500 MG PO TB24
1000.0000 mg | ORAL_TABLET | Freq: Every day | ORAL | Status: DC
  Administered 2016-12-06: 1000 mg via ORAL
  Filled 2016-12-06 (×2): qty 2

## 2016-12-06 NOTE — ED Notes (Signed)
Pt speaking to TTS 

## 2016-12-06 NOTE — ED Notes (Signed)
No answer lobby when called to room

## 2016-12-06 NOTE — ED Notes (Signed)
Pt pulled his IV out. Pt appears to responding to internal stimuli. Pt mouthing words and speaking to someone that's not there. Pt appears paranoid, wont speak unless the door is shut.

## 2016-12-06 NOTE — ED Notes (Signed)
Attempted to collect UA. Pt stated he could not provide at this time. Urinal at bedside.

## 2016-12-06 NOTE — ED Notes (Signed)
Dr. Criss AlvineGoldston spoke with patient about inpatient treatment. Pt seemed agreeable at this time.
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
## 2016-12-06 NOTE — ED Notes (Signed)
Pt eating lunch tray  

## 2016-12-06 NOTE — ED Notes (Signed)
Pt repeatedly takes himself off the monitor and wanders around the room

## 2016-12-06 NOTE — ED Triage Notes (Signed)
Pt just here for same, reports central CP non radiating 10/10, [place]

## 2016-12-06 NOTE — ED Notes (Signed)
Pt called in waiting room but no answer 

## 2016-12-06 NOTE — ED Provider Notes (Signed)
[facility]-EMERGENCY DEPT Provider Note   CSN: [id number] Arrival date & time: 12/06/16  4098     History   Chief Complaint Chief Complaint  Patient presents with  . Chest Pain  . Paranoid    HPI Alfred [name] is a 41 y.o. male.  HPI  41 year old male with a history of ETOH abuse, seizures, schizophrenia and HTN presents with chest pain. States it's been constant for 2 days. Describes it as a "small elephant". He points to his lower chest/upper abdomen. Also having some back pain in his mid upper back. He states he's been walking around a lot but exertion does not seem to increase the pain. There is no cough or shortness of breath. No vomiting. He states he has not drink alcohol in about one week due to no funds. Has had some diarrhea. Over this time he is been having bilateral calf cramping and pain but no swelling. No fevers. Denies recent cocaine use.  Past Medical History:  Diagnosis Date  . Alcohol abuse   . Depression   . Hypertension   . Migraines   . Schizophrenia (HCC)   . Seizure (HCC)    alcoho    Patient Active Problem List   Diagnosis Date Noted  . Pericarditis 12/06/2016  . Rhabdomyolysis 12/06/2016  . EtOH dependence (HCC) 12/06/2016  . Transient alteration of awareness 08/28/2016  . Jerky body movements 08/28/2016  . Seizures (HCC) 06/18/2016  . Internal hemorrhoids 09/30/2013  . Schizophrenia (HCC) 05/29/2006  . TOBACCO DEPENDENCE 05/29/2006    Past Surgical History:  Procedure Laterality Date  . WISDOM TOOTH EXTRACTION         Home Medications    Prior to Admission medications   Medication Sig Start Date End Date Taking? Authorizing Provider  divalproex (DEPAKOTE ER) 500 MG 24 hr tablet Take 1,000 mg by mouth at bedtime.    Yes [provider]  doxycycline (VIBRAMYCIN) 100 MG capsule Take 1 capsule (100 mg total) by mouth 2 (two) times daily. Patient not taking: Reported on 12/06/2016 07/16/16   Shirline Frees, NP  omeprazole (PRILOSEC)  40 MG capsule Take 1 capsule (40 mg total) by mouth daily. Patient not taking: Reported on 08/21/2016 05/03/16   Shirline Frees, NP  ranitidine (ZANTAC) 150 MG tablet Take 1 tablet (150 mg total) by mouth 2 (two) times daily. Patient not taking: Reported on 08/21/2016 04/26/16   Liberty Handy, PA-C  sucralfate (CARAFATE) 1 GM/10ML suspension Take 10 mLs (1 g total) by mouth 4 (four) times daily -  with meals and at bedtime. Patient not taking: Reported on 08/21/2016 04/26/16   Liberty Handy, PA-C    Family History Family History  Problem Relation Age of Onset  . Diabetes Mother   . Hypertension Mother   . Hyperlipidemia Mother   . Anemia Mother   . Heart murmur Mother   . Colon polyps Maternal Uncle   . Heart disease Maternal Uncle   . Celiac disease Maternal Grandmother   . Diabetes Maternal Grandmother   . Diabetes Maternal Grandfather     Social History Social History  Substance Use Topics  . Smoking status: Current Every Day Smoker    Packs/day: 1.00    Types: Cigarettes  . Smokeless tobacco: Never Used  . Alcohol use Yes     Allergies   Patient has no known allergies.   Review of Systems Review of Systems  Constitutional: Positive for chills and diaphoresis. Negative for fever.  Respiratory:  Negative for cough and shortness of breath.   Cardiovascular: Positive for chest pain.  Gastrointestinal: Positive for abdominal pain and diarrhea. Negative for vomiting.  Genitourinary: Negative for dysuria.  Musculoskeletal: Positive for myalgias (leg cramps).  All other systems reviewed and are negative.    Physical Exam Updated Vital Signs BP 122/64   Pulse (!) 101   Temp 99.8 F (37.7 C) (Oral)   Resp (!) 23   SpO2 98%   Physical Exam  Constitutional: He is oriented to person, place, and time. He appears well-developed and well-nourished. No distress.  HENT:  Head: Normocephalic and atraumatic.  Right Ear: External ear normal.  Left Ear: External ear  normal.  Nose: Nose normal.  Eyes: Right eye exhibits no discharge. Left eye exhibits no discharge.  Neck: Neck supple.  Cardiovascular: Regular rhythm and normal heart sounds.  Tachycardia present.   Pulses:      Radial pulses are 2+ on the right side, and 2+ on the left side.  Pulmonary/Chest: Effort normal and breath sounds normal. He exhibits no tenderness.  Abdominal: Soft. There is tenderness (mild) in the right upper quadrant. There is CVA tenderness (left).  Musculoskeletal:  Mild tenderness to bilateral calves, no swelling  Neurological: He is alert and oriented to person, place, and time.  Skin: Skin is warm and dry.  Nursing note and vitals reviewed.    ED Treatments / Results  Labs (all labs ordered are listed, but only abnormal results are displayed) Labs Reviewed  HEPATIC FUNCTION PANEL - Abnormal; Notable for the following:       Result Value   Total Protein 8.3 (*)    AST 137 (*)    Indirect Bilirubin 1.0 (*)    All other components within normal limits  URINALYSIS, ROUTINE W REFLEX MICROSCOPIC - Abnormal; Notable for the following:    Color, Urine AMBER (*)    APPearance CLOUDY (*)    Hgb urine dipstick LARGE (*)    Bilirubin Urine SMALL (*)    Ketones, ur 5 (*)    Protein, ur >=300 (*)    Bacteria, UA FEW (*)    Squamous Epithelial / LPF 0-5 (*)    All other components within normal limits  RAPID URINE DRUG SCREEN, HOSP PERFORMED - Abnormal; Notable for the following:    Cocaine POSITIVE (*)    Tetrahydrocannabinol POSITIVE (*)    All other components within normal limits  CK - Abnormal; Notable for the following:    Total CK 5,070 (*)    All other components within normal limits  C-REACTIVE PROTEIN - Abnormal; Notable for the following:    CRP 1.4 (*)    All other components within normal limits  D-DIMER, QUANTITATIVE (NOT AT [facility])  LIPASE, BLOOD  ETHANOL  TSH  HIV ANTIBODY (ROUTINE TESTING)  CBC  BASIC METABOLIC PANEL  TROPONIN I  CK    SEDIMENTATION RATE  COMPREHENSIVE METABOLIC PANEL  CBC  I-STAT TROPONIN, ED    EKG  EKG Interpretation  Date/Time:  Friday December 06 2016 15:04:55 EDT Ventricular Rate:  101 PR Interval:    QRS Duration: 76 QT Interval:  323 QTC Calculation: 419 R Axis:   83 Text Interpretation:  Sinus tachycardia Similar to prior. No STEMI.  Confirmed by Alona Bene [phone]) on 12/06/2016 3:21:04 PM Also confirmed by Alona Bene [phone]), editor Barbette Hair [phone])  on 12/06/2016 4:08:01 PM       Radiology Dg Chest 2 View  Result Date: 12/06/2016 CLINICAL DATA:  Chest tightness began while walking. Tachycardia. History of alcohol abuse, hypertension, seizures. EXAM: CHEST  2 VIEW COMPARISON:  04/26/2016 FINDINGS: Pulmonary hyperinflation. The heart size and mediastinal contours are within normal limits. Both lungs are clear. The visualized skeletal structures are unremarkable. IMPRESSION: No active cardiopulmonary disease. Electronically Signed   By: Burman Nieves M.D.   On: 12/06/2016 01:18   US Abdomen Limited Ruq  Result Date: 12/06/2016 CLINICAL DATA:  Right upper quadrant pain for 2-3 days EXAM: ULTRASOUND ABDOMEN LIMITED RIGHT UPPER QUADRANT COMPARISON:  None. FINDINGS: Gallbladder: No gallstones or wall thickening visualized. No sonographic Murphy sign noted by sonographer. Common bile duct: Diameter: Normal caliber, 3 mm Liver: No focal lesion identified. Within normal limits in parenchymal echogenicity. Portal vein is patent on color Doppler imaging with normal direction of blood flow towards the liver. IMPRESSION: Normal right upper quadrant ultrasound. Electronically Signed   By: Charlett Nose M.D.   On: 12/06/2016 09:06    Procedures Procedures (including critical care time)  Medications Ordered in ED Medications  thiamine (VITAMIN B-1) tablet 100 mg (100 mg Oral Given 12/06/16 1022)    Or  thiamine (B-1) injection 100 mg ( Intravenous See Alternative 12/06/16 1022)  nicotine  (NICODERM CQ - dosed in mg/24 hours) patch 21 mg (21 mg Transdermal Patch Applied 12/06/16 1022)  ondansetron (ZOFRAN) tablet 4 mg (not administered)  divalproex (DEPAKOTE ER) 24 hr tablet 1,000 mg (not administered)  polyethylene glycol (MIRALAX / GLYCOLAX) packet 17 g (not administered)  risperiDONE (RISPERDAL) tablet 3 mg (not administered)  heparin injection 5,000 Units (5,000 Units Subcutaneous Given 12/06/16 1628)  sodium chloride flush (NS) 0.9 % injection 3 mL (not administered)  0.9 %  sodium chloride infusion ( Intravenous New Bag/Given 12/06/16 1636)  LORazepam (ATIVAN) tablet 1 mg (not administered)    Or  LORazepam (ATIVAN) injection 1 mg (not administered)  multivitamin with minerals tablet 1 tablet (1 tablet Oral Given 12/06/16 1629)  aspirin tablet 650 mg (650 mg Oral Given 12/06/16 1635)  folic acid (FOLVITE) tablet 1 mg (not administered)  colchicine tablet 0.6 mg (not administered)  sodium chloride 0.9 % bolus 1,000 mL (0 mLs Intravenous Stopped 12/06/16 0826)  sodium chloride 0.9 % bolus 1,000 mL (0 mLs Intravenous Stopped 12/06/16 0826)  fentaNYL (SUBLIMAZE) injection 50 mcg (50 mcg Intravenous Given 12/06/16 0733)  aspirin chewable tablet 324 mg (324 mg Oral Given 12/06/16 0733)  sodium chloride 0.9 % bolus 1,000 mL (0 mLs Intravenous Stopped 12/06/16 1313)     Initial Impression / Assessment and Plan / ED Course  I have reviewed the triage vital signs and the nursing notes.  Pertinent labs & imaging results that were available during my care of the patient were reviewed by me and considered in my medical decision making (see chart for details).     While in ED, patient's pain resolved. However he all of a sudden became paranoid and having hallucinations. I think this is psychosis but don't think ETOH withdrawal is the cause (no HTN, HR improving, has history of schizophrenia, no tremors). Some improvement with oral ativan and risperdal. Unclear cause of CP, though with 2 negative  troponins, negative ddimer, I think PE and ACS are unlikely. Given leg cramping and myoglobinuria, CK added on. Likely has rhabdo from cocaine abuse and walking for 2 days. Given IV fluids. Will admit, will need psych admit after. He is currently voluntary, but would need IVC if tries to leave before having return of normal mental status.  Final Clinical Impressions(s) / ED Diagnoses   Final diagnoses:  RUQ abdominal pain  Non-traumatic rhabdomyolysis  Acute psychosis    New Prescriptions New Prescriptions   No medications on file     Pricilla LovelessGoldston, Shiloh Southern, MD 12/06/16 [phone]

## 2016-12-06 NOTE — ED Notes (Signed)
Pt to US.

## 2016-12-06 NOTE — ED Notes (Signed)
Attempted report 

## 2016-12-06 NOTE — Progress Notes (Signed)
Cr improved, but CK total up to 10K.  Will increase fluids to 200cc/hr.   Debe CoderMULLEN, Alfred Avina, MD

## 2016-12-06 NOTE — Progress Notes (Signed)
Pt admitted to hospital due to concerns re: possible pericarditis.  Pt has been assessed by TTS and at the time of assessment met inpatient criteria per Earleen Newport, NP.  However, pt has been medically admitted.    Disposition CSW will wait to send referral information for placement until pt is medically cleared and reassessed.  Areatha Keas. Judi Cong, MSW, [facility] Disposition Clinical Social Work [phone] (cell) [phone] (office)

## 2016-12-06 NOTE — ED Triage Notes (Signed)
[facility] started c/o chest tightness while walking; pt's clothes are very damp; tachycardic in triage

## 2016-12-06 NOTE — H&P (Addendum)
History and Physical    Alfred [name]:[id number] DOB: 11/15/1975 DOA: 12/06/2016  PCP: Dorothyann Peng, NP, has not been following up Patient coming from: Home (boat)    Chief Complaint: Chest pain  HPI: Alfred [name] is a 41 y.o. male with medical history significant of HTN, schizophrenia, ETOH use, h/o cocaine use, seizures who presents for chest pain.  Alfred [name] was sleepy and very difficult to wake up when I saw him.  He had received some BZD and risperdone, but this was a few hours prior to me seeing him.  Some of history is provided by EDP and nurse who have been taking care of him.  By report, Alfred [name] walked in at 1am this morning from [facility].  He was complaining of chest pain and was sweaty. He did not wait at that time to be seen.  He came back about 7am continuing to complain of chest pain.  He notes the pain is sharp in the right and left side of his sternum.  He reports radiation down his left arm.  He also complained of aching in his left leg.  I was not able to get much more history from him.  He notes that everything else was "fine."  He denies any abdominal pain, cough, fever, urinary or bowel symptoms.    There was concern for upper abdominal pain and an ultrasound of the RUQ of the abdomen was performed which was normal.  While here, he developed what appeared to be acute psychosis with hearing voices and responding to stimuli not present in room.  He also was becoming paranoid.  He told me that he was seeing ghosts.  Told the nurse that he was talking to people from other dimensions.  He has a Actuary and was evaluated by telepsych who have deemed that he meets inpatient criteria.  He was previously on depakote and risperdone which were restarted by the ED.  He told me that he has not been taking these medications.    He may be homeless, when I asked him where he was living he stated "on a boat."  Nursing reported that he came in disheveled and wearing 6 layers  of clothing despite the heat.    ED Course: In the ED, he was found to have ST elevations on EKG, somewhat diffuse concerning for pericarditis.  He had an elevated CK with hematuria but no red cells (myoglobinuria). H e had TnI X 2 which were negative and a normal D dimer.  AST was 137, ALT normal in concordance with known ETOH abuse.  UDS was + for cocaine and THC.  He had a mildly elevated Cr at 1.5 with a  Normal BUN (baseline around 1.2 from review).   CXR showed no active disease.    Review of Systems: As per HPI otherwise 10 point review of systems negative, but difficult to assess given mental status.   Past Medical History:  Diagnosis Date  . Alcohol abuse   . Depression   . Hypertension   . Migraines   . Schizophrenia ([place])   . Seizure ([place])    alcoho    Past Surgical History:  Procedure Laterality Date  . WISDOM TOOTH EXTRACTION     Reviewed and he reported cigarettes, ETOH and MJ.  Denied recent Cocaine use.   reports that he has been smoking Cigarettes.  He has been smoking about 1.00 pack per day. He has never used smokeless tobacco. He reports that he  drinks alcohol. He reports that he uses drugs, including Marijuana.  No Known Allergies  Reviewed.  Family History  Problem Relation Age of Onset  . Diabetes Mother   . Hypertension Mother   . Hyperlipidemia Mother   . Anemia Mother   . Heart murmur Mother   . Colon polyps Maternal Uncle   . Heart disease Maternal Uncle   . Celiac disease Maternal Grandmother   . Diabetes Maternal Grandmother   . Diabetes Maternal Grandfather     Reported to me not taking any medications currently.   Prior to Admission medications   Medication Sig Start Date End Date Taking? Authorizing Provider  divalproex (DEPAKOTE ER) 500 MG 24 hr tablet Take 1,000 mg by mouth at bedtime.     [provider]         Lidocaine, Anorectal, (RECTICARE) 5 % CREA Apply to rectum 3-4 times a day as needed.    [provider]    omeprazole (PRILOSEC) 40 MG capsule Take 1 capsule (40 mg total) by mouth daily. Patient not taking: Reported on 08/21/2016 05/03/16   Dorothyann Peng, NP  polyethylene glycol powder (GLYCOLAX/MIRALAX) powder Take 17 grams in 8 oz if water or juice daily. 09/30/13   Esterwood, Amy S, PA-C  ranitidine (ZANTAC) 150 MG tablet Take 1 tablet (150 mg total) by mouth 2 (two) times daily. Patient not taking: Reported on 08/21/2016 04/26/16   Kinnie Feil, PA-C  risperiDONE (RISPERDAL) 3 MG tablet Take 3 mg by mouth at bedtime.    [provider]  sucralfate (CARAFATE) 1 GM/10ML suspension Take 10 mLs (1 g total) by mouth 4 (four) times daily -  with meals and at bedtime. Patient not taking: Reported on 08/21/2016 04/26/16   Kinnie Feil, [place]    Physical Exam: Vitals:   12/06/16 1015 12/06/16 1100 12/06/16 1310 12/06/16 1313  BP: 120/87 140/79 135/72   Pulse: (!) 102  (!) 114   Resp: (!) 23 (!) 21 20   Temp:    99.8 F (37.7 C)  TempSrc:    Oral  SpO2: 98%  99%     Constitutional: Very deeply asleep, falling asleep in between questions Vitals:   12/06/16 1015 12/06/16 1100 12/06/16 1310 12/06/16 1313  BP: 120/87 140/79 135/72   Pulse: (!) 102  (!) 114   Resp: (!) 23 (!) 21 20   Temp:    99.8 F (37.7 C)  TempSrc:    Oral  SpO2: 98%  99%    Eyes: PERRL, injected conjunctivae, anicteric sclerae ENMT: Mucous membranes are mildly dry. Posterior pharynx clear of any exudate or lesions. Neck: normal, supple, no masses noted Respiratory: clear to auscultation bilaterally, no wheezing, no crackles.  No accessory muscle use.  Cardiovascular: Tachycardic, NR.  There was an extra heart sound at the LUSB which could have been a rub, but very faint.  Abdomen: no tenderness, +BS, scaphoid Musculoskeletal: no clubbing / cyanosis.  Normal muscle tone.  Skin: no rashes, lesions, ulcers on exposed skin Neurologic: Very sleepy, but grossly moving all extremities.   Psychiatric: Difficult  to assess, hearing voices and seeing ghosts.  Very somnolent.   Labs on Admission: I have personally reviewed following labs and imaging studies  CBC:  Recent Labs Lab 12/06/16 0037  WBC 11.5*  HGB 13.9  HCT 42.4  MCV 88.3  PLT 975   Basic Metabolic Panel:  Recent Labs Lab 12/06/16 0037  NA 140  K 3.7  CL 101  CO2 25  GLUCOSE 108*  BUN 7  CREATININE 1.50*  CALCIUM 10.1   GFR: CrCl cannot be calculated (Unknown ideal weight.). Liver Function Tests:  Recent Labs Lab 12/06/16 0728  AST 137*  ALT 32  ALKPHOS 63  BILITOT 1.2  PROT 8.3*  ALBUMIN 4.9    Recent Labs Lab 12/06/16 0728  LIPASE 31   No results for input(s): AMMONIA in the last 168 hours. Coagulation Profile: No results for input(s): INR, PROTIME in the last 168 hours. Cardiac Enzymes:  Recent Labs Lab 12/06/16 0728  CKTOTAL 5,070*   BNP (last 3 results) No results for input(s): PROBNP in the last 8760 hours. HbA1C: No results for input(s): HGBA1C in the last 72 hours. CBG: No results for input(s): GLUCAP in the last 168 hours. Lipid Profile: No results for input(s): CHOL, HDL, LDLCALC, TRIG, CHOLHDL, LDLDIRECT in the last 72 hours. Thyroid Function Tests: No results for input(s): TSH, T4TOTAL, FREET4, T3FREE, THYROIDAB in the last 72 hours. Anemia Panel: No results for input(s): VITAMINB12, FOLATE, FERRITIN, TIBC, IRON, RETICCTPCT in the last 72 hours. Urine analysis:    Component Value Date/Time   COLORURINE AMBER (A) 12/06/2016 0925   APPEARANCEUR CLOUDY (A) 12/06/2016 0925   LABSPEC 1.028 12/06/2016 0925   PHURINE 5.0 12/06/2016 0925   GLUCOSEU NEGATIVE 12/06/2016 0925   HGBUR LARGE (A) 12/06/2016 0925   BILIRUBINUR SMALL (A) 12/06/2016 0925   BILIRUBINUR N 06/14/2016 1131   KETONESUR 5 (A) 12/06/2016 0925   PROTEINUR >=300 (A) 12/06/2016 0925   UROBILINOGEN 0.2 06/14/2016 1131   NITRITE NEGATIVE 12/06/2016 0925   LEUKOCYTESUR NEGATIVE 12/06/2016 0925    Radiological  Exams on Admission: Dg Chest 2 View  Result Date: 12/06/2016 CLINICAL DATA:  Chest tightness began while walking. Tachycardia. History of alcohol abuse, hypertension, seizures. EXAM: CHEST  2 VIEW COMPARISON:  04/26/2016 FINDINGS: Pulmonary hyperinflation. The heart size and mediastinal contours are within normal limits. Both lungs are clear. The visualized skeletal structures are unremarkable. IMPRESSION: No active cardiopulmonary disease. Electronically Signed   By: Lucienne Capers M.D.   On: 12/06/2016 01:18   US Abdomen Limited Ruq  Result Date: 12/06/2016 CLINICAL DATA:  Right upper quadrant pain for 2-3 days EXAM: ULTRASOUND ABDOMEN LIMITED RIGHT UPPER QUADRANT COMPARISON:  None. FINDINGS: Gallbladder: No gallstones or wall thickening visualized. No sonographic Murphy sign noted by sonographer. Common bile duct: Diameter: Normal caliber, 3 mm Liver: No focal lesion identified. Within normal limits in parenchymal echogenicity. Portal vein is patent on color Doppler imaging with normal direction of blood flow towards the liver. IMPRESSION: Normal right upper quadrant ultrasound. Electronically Signed   By: Rolm Baptise M.D.   On: 12/06/2016 09:06    EKG: Independently reviewed. Diffuse ST elevation with PR depression.   Assessment/Plan Pericarditis - EKG suggestive, with ongoing chest pain - Repeat EKG (evolution would suggest pericarditis) - Start Aspirin and colchicine for treatment - Check ESR and CRP - Trend Troponin - Telemetry - Check TTE - CXR without enlarged heart, he is tachycardic, but has no other signs of tamponade.  Monitor closely.  - He also recently used cocaine based on UDS, so tachycardia and chest pain could also be explained by cocaine use.   Rhabdomyolysis with AKI - CK > 5 X ULN which is diagnostic, also with myoglobinuria.  Possibly exertional as patient has been living outside.  ? If cocaine related.  - IVF with NS at 150cc/hr  - Trend CK - Recheck urine in the  AM -  pain control with tylenol and IV medications if needed    Schizophrenia  - Untreated at this time, meets inpatient criteria based on telepsych evaluation - ED restarted depakote and risperdone, these will be continued - Psych evaluation for recommendations - Sitter at bedside.     TOBACCO DEPENDENCE - Nicotine patch  H/O Seizures  - Seizure precautions.     EtOH dependence with AST elevation - CIWA protocol, check CMET in the AM     DVT prophylaxis: Heparin Code Status: Full, but unable to fully assess  Disposition Plan: Admit for resolution of acute medical issues, transfer to [facility] or inpatient psych when ready for discharge.   Consults called: Telepsych Admission status: Inpatient, telemetry   Gilles Chiquito MD [facility] Pager [phone]  If 7PM-7AM, please contact night-coverage [email or web address] Password [email or web address]  12/06/2016, 1:54 PM

## 2016-12-07 ENCOUNTER — Inpatient Hospital Stay (HOSPITAL_COMMUNITY): Payer: Medicare HMO

## 2016-12-07 DIAGNOSIS — F14151 Cocaine abuse with cocaine-induced psychotic disorder with hallucinations: Secondary | ICD-10-CM

## 2016-12-07 DIAGNOSIS — R9431 Abnormal electrocardiogram [ECG] [EKG]: Secondary | ICD-10-CM

## 2016-12-07 DIAGNOSIS — F17200 Nicotine dependence, unspecified, uncomplicated: Secondary | ICD-10-CM

## 2016-12-07 DIAGNOSIS — F23 Brief psychotic disorder: Secondary | ICD-10-CM

## 2016-12-07 DIAGNOSIS — F12159 Cannabis abuse with psychotic disorder, unspecified: Secondary | ICD-10-CM

## 2016-12-07 DIAGNOSIS — I319 Disease of pericardium, unspecified: Secondary | ICD-10-CM

## 2016-12-07 DIAGNOSIS — M6282 Rhabdomyolysis: Secondary | ICD-10-CM

## 2016-12-07 DIAGNOSIS — R5383 Other fatigue: Secondary | ICD-10-CM

## 2016-12-07 DIAGNOSIS — F1020 Alcohol dependence, uncomplicated: Secondary | ICD-10-CM

## 2016-12-07 DIAGNOSIS — R450 Nervousness: Secondary | ICD-10-CM

## 2016-12-07 DIAGNOSIS — R443 Hallucinations, unspecified: Secondary | ICD-10-CM

## 2016-12-07 DIAGNOSIS — F419 Anxiety disorder, unspecified: Secondary | ICD-10-CM

## 2016-12-07 DIAGNOSIS — R079 Chest pain, unspecified: Secondary | ICD-10-CM

## 2016-12-07 DIAGNOSIS — R5381 Other malaise: Secondary | ICD-10-CM

## 2016-12-07 DIAGNOSIS — F12151 Cannabis abuse with psychotic disorder with hallucinations: Secondary | ICD-10-CM

## 2016-12-07 DIAGNOSIS — F209 Schizophrenia, unspecified: Secondary | ICD-10-CM

## 2016-12-07 DIAGNOSIS — F17210 Nicotine dependence, cigarettes, uncomplicated: Secondary | ICD-10-CM

## 2016-12-07 LAB — COMPREHENSIVE METABOLIC PANEL
ALK PHOS: 43 U/L (ref 38–126)
ALT: 61 U/L (ref 17–63)
ANION GAP: 8 (ref 5–15)
AST: 331 U/L — ABNORMAL HIGH (ref 15–41)
Albumin: 3.2 g/dL — ABNORMAL LOW (ref 3.5–5.0)
BUN: 15 mg/dL (ref 6–20)
CALCIUM: 8.4 mg/dL — AB (ref 8.9–10.3)
CHLORIDE: 109 mmol/L (ref 101–111)
CO2: 22 mmol/L (ref 22–32)
Creatinine, Ser: 1.3 mg/dL — ABNORMAL HIGH (ref 0.61–1.24)
GFR calc non Af Amer: >60 mL/min (ref >60)
GLUCOSE: 90 mg/dL (ref 65–99)
Potassium: 4 mmol/L (ref 3.5–5.1)
SODIUM: 139 mmol/L (ref 135–145)
Total Bilirubin: 1 mg/dL (ref 0.3–1.2)
Total Protein: 5.9 g/dL — ABNORMAL LOW (ref 6.5–8.1)

## 2016-12-07 LAB — CBC
HEMATOCRIT: 38.4 % — AB (ref 39.0–52.0)
HEMOGLOBIN: 12 g/dL — AB (ref 13.0–17.0)
MCH: 28.2 pg (ref 26.0–34.0)
MCHC: 31.3 g/dL (ref 30.0–36.0)
MCV: 90.4 fL (ref 78.0–100.0)
Platelets: 175 10*3/uL (ref 150–400)
RBC: 4.25 MIL/uL (ref 4.22–5.81)
RDW: 13.7 % (ref 11.5–15.5)
WBC: 7.5 10*3/uL (ref 4.0–10.5)

## 2016-12-07 LAB — ECHOCARDIOGRAM COMPLETE
Height: 72 in
WEIGHTICAEL: 2883.2 [oz_av]

## 2016-12-07 LAB — HIV ANTIBODY (ROUTINE TESTING W REFLEX): HIV SCREEN 4TH GENERATION: NONREACTIVE

## 2016-12-07 LAB — CK: Total CK: 10880 U/L — ABNORMAL HIGH (ref 49–397)

## 2016-12-07 MED ORDER — ACETAMINOPHEN 325 MG PO TABS
650.0000 mg | ORAL_TABLET | Freq: Four times a day (QID) | ORAL | Status: DC | PRN
  Administered 2016-12-07: 650 mg via ORAL
  Filled 2016-12-07: qty 2

## 2016-12-07 MED ORDER — DIVALPROEX SODIUM ER 500 MG PO TB24
500.0000 mg | ORAL_TABLET | Freq: Every day | ORAL | Status: DC
  Administered 2016-12-07: 500 mg via ORAL
  Filled 2016-12-07 (×2): qty 1

## 2016-12-07 MED ORDER — OLANZAPINE 5 MG PO TABS
5.0000 mg | ORAL_TABLET | Freq: Two times a day (BID) | ORAL | Status: DC
  Administered 2016-12-07 – 2016-12-08 (×2): 5 mg via ORAL
  Filled 2016-12-07 (×4): qty 1

## 2016-12-07 MED ORDER — SODIUM CHLORIDE 0.9 % IV SOLN
INTRAVENOUS | Status: DC
  Administered 2016-12-07 – 2016-12-08 (×3): via INTRAVENOUS

## 2016-12-07 NOTE — Plan of Care (Signed)
Problem: Pain Managment: Goal: General experience of comfort will improve Outcome: Progressing Pain controlled with Tylenol   

## 2016-12-07 NOTE — Progress Notes (Signed)
  Echocardiogram 2D Echocardiogram has been performed.  [name] 12/07/2016, 2:54 PM

## 2016-12-07 NOTE — Consult Note (Signed)
[facility] Consult   Reason for Consult: psychosis Referring Physician:  Dr. Sheila Oats Patient Identification: COPE MARTE MRN:  [id number] Principal Diagnosis: Cocaine abuse w/cocaine-induced psychotic disorder w/hallucinations [facility]) Diagnosis:   Patient Active Problem List   Diagnosis Date Noted  . Cocaine abuse w/cocaine-induced psychotic disorder w/hallucinations ([place]) [F14.151] 12/07/2016  . Cannabis abuse with psychotic disorder ([place]) [F12.159] 12/07/2016  . Pericarditis [I31.9] 12/06/2016  . Rhabdomyolysis [M62.82] 12/06/2016  . EtOH dependence ([place]) [F10.20] 12/06/2016  . Transient alteration of awareness [R40.4] 08/28/2016  . Jerky body movements [R25.8] 08/28/2016  . Seizures ([place]) [R56.9] 06/18/2016  . Internal hemorrhoids [K64.8] 09/30/2013  . Schizophrenia ([place]) [F20.9] 05/29/2006  . TOBACCO DEPENDENCE [F17.200] 05/29/2006    Total Time spent with patient: 45 minutes  Subjective:   Alfred [name] is a 41 y.o. male patient admitted with chest pain.  HPI:  Patient who reports history of psychosis, substance abuse but was admitted due to severe chest pain. Patient reports that he was not compliant with his Depakote or Risperdal but instead he was abusing cocaine, cannabis and using [place] remedy for sexual health.'' He endorses hearing voices, seeing colors and feeling paranoid yesterday. He reports feeling  like people are following  and watching him. Today, he is calm, cooperative and endorses decreased psychosis or delusional thinking. He denies SI/HI, he is requesting to get back on his medications.  Past Psychiatric History: as above  Risk to Self: Suicidal Ideation: No Suicidal Intent: No Is patient at risk for suicide?: No Suicidal Plan?: No Access to Means: No What has been your use of drugs/alcohol within the last 12 months?: no How many times?: 0 Other Self Harm Risks: pt is psychotic and actively  hallucinating Triggers for Past Attempts: None known Intentional Self Injurious Behavior: None Risk to Others: Homicidal Ideation: No Thoughts of Harm to Others: No Current Homicidal Intent: No Current Homicidal Plan: No Access to Homicidal Means: No Identified Victim: none History of harm to others?: No Assessment of Violence: None Noted Violent Behavior Description: no Does patient have access to weapons?: No Criminal Charges Pending?: No Does patient have a court date: No Prior Inpatient Therapy: Prior Inpatient Therapy: Yes Prior Therapy Dates: UTA Prior Therapy Facilty/Provider(s): UTA Prior Outpatient Therapy: Prior Outpatient Therapy: Yes Prior Therapy Dates: unknown Prior Therapy Facilty/Provider(s): UTA Does patient have an ACCT team?: No Does patient have Intensive In-House Services?  : No Does patient have [facility] services? : Unknown Does patient have [facility] services?: No  Past Medical History:  Past Medical History:  Diagnosis Date  . Alcohol abuse   . Depression   . Hypertension   . Migraines   . Schizophrenia ([place])   . Seizure ([place])    alcoho    Past Surgical History:  Procedure Laterality Date  . WISDOM TOOTH EXTRACTION     Family History:  Family History  Problem Relation Age of Onset  . Diabetes Mother   . Hypertension Mother   . Hyperlipidemia Mother   . Anemia Mother   . Heart murmur Mother   . Colon polyps Maternal Uncle   . Heart disease Maternal Uncle   . Celiac disease Maternal Grandmother   . Diabetes Maternal Grandmother   . Diabetes Maternal Grandfather    Family Psychiatric  History: Social History:  History  Alcohol Use  . Yes     History  Drug Use  . Types: Marijuana    Social History   Social History  .  Marital status: Single    Spouse name: N/A  . Number of children: 3  . Years of education: N/A   Occupational History  . Unemployed    Social History Main Topics  . Smoking status: Current Every Day Smoker     Packs/day: 1.00    Types: Cigarettes  . Smokeless tobacco: Never Used  . Alcohol use Yes  . Drug use: Yes    Types: Marijuana  . Sexual activity: Not Asked   Other Topics Concern  . None   Social History Narrative  . None   Additional Social History:    Allergies:  No Known Allergies  Labs:  Results for orders placed or performed during the hospital encounter of 12/06/16 (from the past 48 hour(s))  D-dimer, quantitative     Status: None   Collection Time: 12/06/16  7:28 AM  Result Value Ref Range   D-Dimer, Quant 0.28 0.00 - 0.50 ug/mL-FEU    Comment: (NOTE) At the manufacturer cut-off of 0.50 ug/mL FEU, this assay has been documented to exclude PE with a sensitivity and negative predictive value of 97 to 99%.  At this time, this assay has not been approved by the FDA to exclude DVT/VTE. Results should be correlated with clinical presentation.   Hepatic function panel     Status: Abnormal   Collection Time: 12/06/16  7:28 AM  Result Value Ref Range   Total Protein 8.3 (H) 6.5 - 8.1 g/dL   Albumin 4.9 3.5 - 5.0 g/dL   AST 137 (H) 15 - 41 U/L   ALT 32 17 - 63 U/L   Alkaline Phosphatase 63 38 - 126 U/L   Total Bilirubin 1.2 0.3 - 1.2 mg/dL   Bilirubin, Direct 0.2 0.1 - 0.5 mg/dL   Indirect Bilirubin 1.0 (H) 0.3 - 0.9 mg/dL  Lipase, blood     Status: None   Collection Time: 12/06/16  7:28 AM  Result Value Ref Range   Lipase 31 11 - 51 U/L  Ethanol     Status: None   Collection Time: 12/06/16  7:28 AM  Result Value Ref Range   Alcohol, Ethyl (B) <5 <5 mg/dL    Comment:        LOWEST DETECTABLE LIMIT FOR SERUM ALCOHOL IS 5 mg/dL FOR MEDICAL PURPOSES ONLY   CK     Status: Abnormal   Collection Time: 12/06/16  7:28 AM  Result Value Ref Range   Total CK 5,070 (H) 49 - 397 U/L    Comment: RESULTS CONFIRMED BY MANUAL DILUTION  I-stat troponin, ED     Status: None   Collection Time: 12/06/16  7:33 AM  Result Value Ref Range   Troponin i, poc 0.05 0.00 - 0.08 ng/mL    Comment 3            Comment: Due to the release kinetics of cTnI, a negative result within the first hours of the onset of symptoms does not rule out myocardial infarction with certainty. If myocardial infarction is still suspected, repeat the test at appropriate intervals.   Urinalysis, Routine w reflex microscopic     Status: Abnormal   Collection Time: 12/06/16  9:25 AM  Result Value Ref Range   Color, Urine AMBER (A) YELLOW    Comment: BIOCHEMICALS MAY BE AFFECTED BY COLOR   APPearance CLOUDY (A) CLEAR   Specific Gravity, Urine 1.028 1.005 - 1.030   pH 5.0 5.0 - 8.0   Glucose, UA NEGATIVE NEGATIVE mg/dL   Hgb  urine dipstick LARGE (A) NEGATIVE   Bilirubin Urine SMALL (A) NEGATIVE   Ketones, ur 5 (A) NEGATIVE mg/dL   Protein, ur >=300 (A) NEGATIVE mg/dL   Nitrite NEGATIVE NEGATIVE   Leukocytes, UA NEGATIVE NEGATIVE   RBC / HPF 0-5 0 - 5 RBC/hpf   WBC, UA 0-5 0 - 5 WBC/hpf   Bacteria, UA FEW (A) NONE SEEN   Squamous Epithelial / LPF 0-5 (A) NONE SEEN   Mucus PRESENT    Hyaline Casts, UA PRESENT    Granular Casts, UA PRESENT    Amorphous Crystal PRESENT   Urine rapid drug screen (hosp performed)     Status: Abnormal   Collection Time: 12/06/16  9:25 AM  Result Value Ref Range   Opiates NONE DETECTED NONE DETECTED   Cocaine POSITIVE (A) NONE DETECTED   Benzodiazepines NONE DETECTED NONE DETECTED   Amphetamines NONE DETECTED NONE DETECTED   Tetrahydrocannabinol POSITIVE (A) NONE DETECTED   Barbiturates NONE DETECTED NONE DETECTED    Comment:        DRUG SCREEN FOR MEDICAL PURPOSES ONLY.  IF CONFIRMATION IS NEEDED FOR ANY PURPOSE, NOTIFY LAB WITHIN 5 DAYS.        LOWEST DETECTABLE LIMITS FOR URINE DRUG SCREEN Drug Class       Cutoff (ng/mL) Amphetamine      1000 Barbiturate      200 Benzodiazepine   256 Tricyclics       389 Opiates          300 Cocaine          300 THC              50   HIV antibody (Routine Testing)     Status: None   Collection Time:  12/06/16  3:36 PM  Result Value Ref Range   HIV Screen 4th Generation wRfx Non Reactive Non Reactive    Comment: (NOTE) Performed At: [facility] [place], [place] [id number] Lindon Romp MD [id number]   CBC     Status: Abnormal   Collection Time: 12/06/16  3:36 PM  Result Value Ref Range   WBC 10.9 (H) 4.0 - 10.5 K/uL   RBC 4.60 4.22 - 5.81 MIL/uL   Hemoglobin 13.7 13.0 - 17.0 g/dL   HCT 40.8 39.0 - 52.0 %   MCV 88.7 78.0 - 100.0 fL   MCH 29.8 26.0 - 34.0 pg   MCHC 33.6 30.0 - 36.0 g/dL   RDW 13.7 11.5 - 15.5 %   Platelets 187 150 - 400 K/uL  TSH     Status: None   Collection Time: 12/06/16  3:36 PM  Result Value Ref Range   TSH 1.370 0.350 - 4.500 uIU/mL    Comment: Performed by a 3rd Generation assay with a functional sensitivity of <=0.01 uIU/mL.  Basic metabolic panel     Status: Abnormal   Collection Time: 12/06/16  3:36 PM  Result Value Ref Range   Sodium 138 135 - 145 mmol/L   Potassium 4.1 3.5 - 5.1 mmol/L   Chloride 104 101 - 111 mmol/L   CO2 23 22 - 32 mmol/L   Glucose, Bld 95 65 - 99 mg/dL   BUN 14 6 - 20 mg/dL   Creatinine, Ser 1.34 (H) 0.61 - 1.24 mg/dL   Calcium 8.9 8.9 - 10.3 mg/dL   GFR calc non Af Amer >60 >60 mL/min   GFR calc Af Amer >60 >60 mL/min    Comment: (NOTE) The  eGFR has been calculated using the CKD EPI equation. This calculation has not been validated in all clinical situations. eGFR's persistently <60 mL/min signify possible Chronic Kidney Disease.    Anion gap 11 5 - 15  Troponin I     Status: Abnormal   Collection Time: 12/06/16  3:36 PM  Result Value Ref Range   Troponin I 0.05 (HH) <0.03 ng/mL    Comment: CRITICAL RESULT CALLED TO, READ BACK BY AND VERIFIED WITH: L MEULLER,RN 1724 12/06/2016 WBOND   CK     Status: Abnormal   Collection Time: 12/06/16  3:36 PM  Result Value Ref Range   Total CK 10,952 (H) 49 - 397 U/L    Comment: RESULTS CONFIRMED BY MANUAL DILUTION  Sedimentation rate      Status: None   Collection Time: 12/06/16  3:36 PM  Result Value Ref Range   Sed Rate 0 0 - 16 mm/hr  C-reactive protein     Status: Abnormal   Collection Time: 12/06/16  3:36 PM  Result Value Ref Range   CRP 1.4 (H) <1.0 mg/dL  Comprehensive metabolic panel     Status: Abnormal   Collection Time: 12/07/16  3:10 AM  Result Value Ref Range   Sodium 139 135 - 145 mmol/L   Potassium 4.0 3.5 - 5.1 mmol/L   Chloride 109 101 - 111 mmol/L   CO2 22 22 - 32 mmol/L   Glucose, Bld 90 65 - 99 mg/dL   BUN 15 6 - 20 mg/dL   Creatinine, Ser 1.30 (H) 0.61 - 1.24 mg/dL   Calcium 8.4 (L) 8.9 - 10.3 mg/dL   Total Protein 5.9 (L) 6.5 - 8.1 g/dL   Albumin 3.2 (L) 3.5 - 5.0 g/dL   AST 331 (H) 15 - 41 U/L   ALT 61 17 - 63 U/L   Alkaline Phosphatase 43 38 - 126 U/L   Total Bilirubin 1.0 0.3 - 1.2 mg/dL   GFR calc non Af Amer >60 >60 mL/min   GFR calc Af Amer >60 >60 mL/min    Comment: (NOTE) The eGFR has been calculated using the CKD EPI equation. This calculation has not been validated in all clinical situations. eGFR's persistently <60 mL/min signify possible Chronic Kidney Disease.    Anion gap 8 5 - 15  CBC     Status: Abnormal   Collection Time: 12/07/16  3:10 AM  Result Value Ref Range   WBC 7.5 4.0 - 10.5 K/uL   RBC 4.25 4.22 - 5.81 MIL/uL   Hemoglobin 12.0 (L) 13.0 - 17.0 g/dL   HCT 38.4 (L) 39.0 - 52.0 %   MCV 90.4 78.0 - 100.0 fL   MCH 28.2 26.0 - 34.0 pg   MCHC 31.3 30.0 - 36.0 g/dL   RDW 13.7 11.5 - 15.5 %   Platelets 175 150 - 400 K/uL  CK     Status: Abnormal   Collection Time: 12/07/16  3:10 AM  Result Value Ref Range   Total CK 10,880 (H) 49 - 397 U/L    Comment: RESULTS CONFIRMED BY MANUAL DILUTION    Current Facility-Administered Medications  Medication Dose Route Frequency Provider Last Rate Last Dose  . 0.9 %  sodium chloride infusion   Intravenous Continuous Barton Dubois, MD 150 mL/hr at 12/07/16 1418    . acetaminophen (TYLENOL) tablet 650 mg  650 mg Oral Q6H  PRN Barton Dubois, MD   650 mg at 12/07/16 0913  . aspirin tablet 650 mg  650  mg Oral TID Sid Falcon, MD   650 mg at 12/07/16 0946  . colchicine tablet 0.6 mg  0.6 mg Oral BID Gilles Chiquito B, MD   0.6 mg at 12/07/16 0947  . divalproex (DEPAKOTE ER) 24 hr tablet 500 mg  500 mg Oral QHS Skyelar Swigart, MD      . folic acid (FOLVITE) tablet 1 mg  1 mg Oral Daily Gilles Chiquito B, MD   1 mg at 12/07/16 0947  . heparin injection 5,000 Units  5,000 Units Subcutaneous Q8H Sid Falcon, MD   5,000 Units at 12/07/16 1454  . LORazepam (ATIVAN) tablet 1 mg  1 mg Oral Q6H PRN Sid Falcon, MD       Or  . LORazepam (ATIVAN) injection 1 mg  1 mg Intravenous Q6H PRN Gilles Chiquito B, MD      . multivitamin with minerals tablet 1 tablet  1 tablet Oral Daily Gilles Chiquito B, MD   1 tablet at 12/07/16 0947  . nicotine (NICODERM CQ - dosed in mg/24 hours) patch 21 mg  21 mg Transdermal Daily Sherwood Gambler, MD   21 mg at 12/07/16 0948  . OLANZapine (ZYPREXA) tablet 5 mg  5 mg Oral BID Aisling Emigh, MD      . ondansetron (ZOFRAN) tablet 4 mg  4 mg Oral Q8H PRN Sherwood Gambler, MD      . polyethylene glycol (MIRALAX / GLYCOLAX) packet 17 g  17 g Oral Daily PRN Sherwood Gambler, MD      . sodium chloride flush (NS) 0.9 % injection 3 mL  3 mL Intravenous Q12H Gilles Chiquito B, MD      . thiamine (VITAMIN B-1) tablet 100 mg  100 mg Oral Daily Sherwood Gambler, MD   100 mg at 12/07/16 8502   Or  . thiamine (B-1) injection 100 mg  100 mg Intravenous Daily Sherwood Gambler, MD        Musculoskeletal: Strength & Muscle Tone: within normal limits Gait & Station: normal Patient leans: N/A  Psychiatric Specialty Exam: Physical Exam  Psychiatric: His behavior is normal. Judgment and thought content normal. His mood appears anxious. His speech is delayed. Cognition and memory are normal.    Review of Systems  Constitutional: Positive for malaise/fatigue.  HENT: Negative.   Eyes: Negative.    Respiratory: Negative.   Cardiovascular: Positive for chest pain.  Gastrointestinal: Negative.   Genitourinary: Negative.   Musculoskeletal: Negative.   Skin: Negative.   Neurological: Negative.   Endo/Heme/Allergies: Negative.   Psychiatric/Behavioral: Positive for hallucinations. The patient is nervous/anxious.     Blood pressure 121/71, pulse 87, temperature 98.6 F (37 C), temperature source Oral, resp. rate (!) 21, height 6' (1.829 m), weight 81.7 kg (180 lb 3.2 oz), SpO2 98 %.Body mass index is 24.44 kg/m.  General Appearance: Casual  Eye Contact:  Good  Speech:  Clear and Coherent  Volume:  Normal  Mood:  Dysphoric  Affect:  Constricted  Thought Process:  Coherent  Orientation:  Full (Time, Place, and Person)  Thought Content:  Logical  Suicidal Thoughts:  No  Homicidal Thoughts:  No  Memory:  Immediate;   Good Recent;   Good Remote;   Good  Judgement:  Poor  Insight:  Shallow  Psychomotor Activity:  Psychomotor Retardation  Concentration:  Concentration: Fair and Attention Span: Fair  Recall:  Good  Fund of Knowledge:  Good  Language:  Good  Akathisia:  No  Handed:  Right  AIMS (  if indicated):     Assets:  Communication Skills Desire for Improvement  ADL's:  Intact  Cognition:  WNL  Sleep:   fair     Treatment Plan Summary: Plan/Recommendations: -Restart Depakote ER 500 mg BID for seizure disorder -Discontinue Risperdal due to elevated CK -Start Zyprexa 5 mg bid for psychosis/delusion -Monitor CK level .  Disposition: No evidence of imminent risk to self or others at present.   Patient does not meet criteria for psychiatric inpatient admission. Supportive therapy provided about ongoing stressors. Refer patient to [facility] upon discharge  Corena Pilgrim, MD 12/07/2016 3:08 PM

## 2016-12-07 NOTE — Clinical Social Work Note (Signed)
Inpt psych consult acknowledged, however pt does NOT meet criteria for inpt psych per psych MD note 9/8.  [facility] added to AVS.  No other identified DC needs.  Leigh Blas B. Gean QuintBrown,MSW, LCSWA Clinical Social Work Dept Weekend Social Worker [phone] 5:26 PM

## 2016-12-07 NOTE — Progress Notes (Addendum)
[facility] PROGRESS NOTE  Alfred [name] BJY:[id number] DOB: 09/23/1975 DOA: 12/06/2016 PCP: Shirline FreesNafziger, Cory, NP  Interim summary and HPI 41 y.o. male with medical history significant of HTN, schizophrenia, ETOH use, h/o cocaine use, seizures who presented for chest pain and acute psychosis.  Assessment/Plan: 1-chest discomfort: most likely from use of cocaine -patient with very little elevation of troponin in setting of AKI and rhabdomyolysis  -reassuring echocardiogram (no showing pericardial effusion) -EKG/telemetry w/o ischemic changes today. -will stop high dose ASA and colchicine -continue IVF's and PRN acetaminophen for pain   2-rhabdomyolysis and AKI -most likely associated with dehydration and use of recreational drugs -will continue aggressive IVF's -follow renal function and CK trend   3-schizophrenia -psych has been consulted and will follow rec's -continue sitter for now -patient denies hallucinations this morning and had appropriate interaction   4-tobacco abuse -nicotine patch ordered -cessation counseling provided  5-EtOH dependence with AST elevation  -will follow LFT's -continue CIWA  6-GERD -continue PPI  7-cocaine and marijuana abuse -cessation counseling provided   Code Status: Full Family Communication: no family at bedside  Disposition Plan: following 2-D echo will stop empiric treatment for pericarditis, continue aggressive IVF's for rhabdomyolysis and will follow psych rec's for treatment of his psychosis.   Consultants:  Psychiatry   Procedures:  See below for x-ray reports  2-D echo - Left ventricle: Wall thickness was increased in a pattern of mild   LVH. Systolic function was normal. The estimated ejection   fraction was in the range of 55% to 60%. Wall motion was normal;   there were no regional wall motion abnormalities.  Antibiotics:  None   HPI/Subjective: Afebrile, no SOB, oriented X3 and denies hallucinations  currently.   Objective: Vitals:   12/07/16 1344 12/07/16 1550  BP: 121/71 112/61  Pulse: 87 90  Resp: (!) 21 13  Temp:  98.6 F (37 C)  SpO2: 98% 100%    Intake/Output Summary (Last 24 hours) at 12/07/16 1603 Last data filed at 12/07/16 1500  Gross per 24 hour  Intake          3683.33 ml  Output              400 ml  Net          3283.33 ml   Filed Weights   12/07/16 0659  Weight: 81.7 kg (180 lb 3.2 oz)    Exam:   General: afebrile, oriented X3; no hallucinations currently. Reports pain all over.  Cardiovascular: S1 and S2, no rubs, no gallops, no JVD   Respiratory: CTA bilaterally, no wheezing   Abdomen: soft, NT, ND, positive BS  Musculoskeletal: no edema, no cyanosis, no clubbing   Data Reviewed: Basic Metabolic Panel:  Recent Labs Lab 12/06/16 0037 12/06/16 1536 12/07/16 0310  NA 140 138 139  K 3.7 4.1 4.0  CL 101 104 109  CO2 25 23 22   GLUCOSE 108* 95 90  BUN 7 14 15   CREATININE 1.50* 1.34* 1.30*  CALCIUM 10.1 8.9 8.4*   Liver Function Tests:  Recent Labs Lab 12/06/16 0728 12/07/16 0310  AST 137* 331*  ALT 32 61  ALKPHOS 63 43  BILITOT 1.2 1.0  PROT 8.3* 5.9*  ALBUMIN 4.9 3.2*    Recent Labs Lab 12/06/16 0728  LIPASE 31   CBC:  Recent Labs Lab 12/06/16 0037 12/06/16 1536 12/07/16 0310  WBC 11.5* 10.9* 7.5  HGB 13.9 13.7 12.0*  HCT 42.4 40.8 38.4*  MCV 88.3 88.7 90.4  PLT 207 187 175   Cardiac Enzymes:  Recent Labs Lab 12/06/16 0728 12/06/16 1536 12/07/16 0310  CKTOTAL 5,070* 10,952* 10,880*  TROPONINI  --  0.05*  --     Studies: Dg Chest 2 View  Result Date: 12/06/2016 CLINICAL DATA:  Chest tightness began while walking. Tachycardia. History of alcohol abuse, hypertension, seizures. EXAM: CHEST  2 VIEW COMPARISON:  04/26/2016 FINDINGS: Pulmonary hyperinflation. The heart size and mediastinal contours are within normal limits. Both lungs are clear. The visualized skeletal structures are unremarkable. IMPRESSION:  No active cardiopulmonary disease. Electronically Signed   By: Burman Nieves M.D.   On: 12/06/2016 01:18   US Abdomen Limited Ruq  Result Date: 12/06/2016 CLINICAL DATA:  Right upper quadrant pain for 2-3 days EXAM: ULTRASOUND ABDOMEN LIMITED RIGHT UPPER QUADRANT COMPARISON:  None. FINDINGS: Gallbladder: No gallstones or wall thickening visualized. No sonographic Murphy sign noted by sonographer. Common bile duct: Diameter: Normal caliber, 3 mm Liver: No focal lesion identified. Within normal limits in parenchymal echogenicity. Portal vein is patent on color Doppler imaging with normal direction of blood flow towards the liver. IMPRESSION: Normal right upper quadrant ultrasound. Electronically Signed   By: Charlett Nose M.D.   On: 12/06/2016 09:06    Scheduled Meds: . divalproex  500 mg Oral QHS  . folic acid  1 mg Oral Daily  . heparin  5,000 Units Subcutaneous Q8H  . multivitamin with minerals  1 tablet Oral Daily  . nicotine  21 mg Transdermal Daily  . OLANZapine  5 mg Oral BID  . sodium chloride flush  3 mL Intravenous Q12H  . thiamine  100 mg Oral Daily   Or  . thiamine  100 mg Intravenous Daily   Continuous Infusions: . sodium chloride 150 mL/hr at 12/07/16 1418    Principal Problem:   Cocaine abuse w/cocaine-induced psychotic disorder w/hallucinations ([facility]) Active Problems:   Schizophrenia ([facility])   TOBACCO DEPENDENCE   Seizures ([facility])   Pericarditis   Rhabdomyolysis   EtOH dependence ([facility])   Cannabis abuse with psychotic disorder ([facility])    Time spent: 30 minutes    Vassie Loll  [facility] Pager [phone]. If 7PM-7AM, please contact night-coverage at [email or web address], password [facility] 12/07/2016, 4:03 PM  LOS: 1 day

## 2016-12-08 DIAGNOSIS — K2100 Gastro-esophageal reflux disease with esophagitis, without bleeding: Secondary | ICD-10-CM

## 2016-12-08 DIAGNOSIS — R569 Unspecified convulsions: Secondary | ICD-10-CM

## 2016-12-08 LAB — CBC
HCT: 35.7 % — ABNORMAL LOW (ref 39.0–52.0)
HEMOGLOBIN: 11.3 g/dL — AB (ref 13.0–17.0)
MCH: 28.7 pg (ref 26.0–34.0)
MCHC: 31.7 g/dL (ref 30.0–36.0)
MCV: 90.6 fL (ref 78.0–100.0)
PLATELETS: 176 10*3/uL (ref 150–400)
RBC: 3.94 MIL/uL — AB (ref 4.22–5.81)
RDW: 13.4 % (ref 11.5–15.5)
WBC: 6.1 10*3/uL (ref 4.0–10.5)

## 2016-12-08 LAB — BASIC METABOLIC PANEL
ANION GAP: 5 (ref 5–15)
BUN: 6 mg/dL (ref 6–20)
CALCIUM: 8.1 mg/dL — AB (ref 8.9–10.3)
CO2: 26 mmol/L (ref 22–32)
Chloride: 110 mmol/L (ref 101–111)
Creatinine, Ser: 1.03 mg/dL (ref 0.61–1.24)
Glucose, Bld: 80 mg/dL (ref 65–99)
Potassium: 4 mmol/L (ref 3.5–5.1)
SODIUM: 141 mmol/L (ref 135–145)

## 2016-12-08 LAB — CK: Total CK: 8846 U/L — ABNORMAL HIGH (ref 49–397)

## 2016-12-08 MED ORDER — OMEPRAZOLE 40 MG PO CPDR: 40.0000 mg | DELAYED_RELEASE_CAPSULE | Freq: Every day | ORAL | 2 refills | Status: DC

## 2016-12-08 MED ORDER — POLYETHYLENE GLYCOL 3350 17 G PO PACK: 17.0000 g | PACK | Freq: Every day | ORAL | 0 refills | Status: DC | PRN

## 2016-12-08 MED ORDER — OLANZAPINE 5 MG PO TABS: 5.0000 mg | ORAL_TABLET | Freq: Two times a day (BID) | ORAL | 1 refills | Status: DC

## 2016-12-08 MED ORDER — DIVALPROEX SODIUM ER 500 MG PO TB24: 1000.0000 mg | ORAL_TABLET | Freq: Every day | ORAL | 1 refills | Status: DC

## 2016-12-08 MED ORDER — NICOTINE 21 MG/24HR TD PT24: 21.0000 mg | MEDICATED_PATCH | Freq: Every day | TRANSDERMAL | 0 refills | Status: DC

## 2016-12-08 MED ORDER — ADULT MULTIVITAMIN W/MINERALS CH: 1.0000 | ORAL_TABLET | Freq: Every day | ORAL | 3 refills | Status: DC

## 2016-12-08 MED ORDER — RANITIDINE HCL 150 MG PO TABS: 150.0000 mg | ORAL_TABLET | Freq: Every day | ORAL | 1 refills | Status: DC

## 2016-12-08 MED ORDER — ACETAMINOPHEN 325 MG PO TABS: 650.0000 mg | ORAL_TABLET | Freq: Four times a day (QID) | ORAL | 0 refills | Status: DC | PRN

## 2016-12-08 NOTE — Plan of Care (Signed)
Problem: Safety: Goal: Ability to remain free from injury will improve Outcome: Progressing Suicide Sitter d/c'd per MD order.

## 2016-12-08 NOTE — Discharge Summary (Signed)
Physician Discharge Summary  Burnard LeighSharif I Wolk ZOX:[id number] DOB: 12/09/1975 DOA: 12/06/2016  PCP: Shirline FreesNafziger, Cory, NP  Admit date: 12/06/2016 Discharge date: 12/08/2016  Time spent: 35 minutes  Recommendations for Outpatient Follow-up:  Repeat CK level. Check CMET to follow electrolytes, LFT's and renal function.   Discharge Diagnoses:  Principal Problem:   Cocaine abuse w/cocaine-induced psychotic disorder w/hallucinations (HCC) Active Problems:   Acute psychosis   TOBACCO DEPENDENCE   Seizures (HCC)   Pericarditis   Rhabdomyolysis   EtOH dependence (HCC)   Cannabis abuse with psychotic disorder (HCC)   Gastroesophageal reflux disease with esophagitis   Discharge Condition: stable and improved. No CP, hallucinations, SI or SOB at discharge. Patient discharge home with mother. Instructions given to arrange follow up with PCP and to follow up at [facility] for further assistance and adjustments on psychiatry medications.   Diet recommendation: heart healthy diet.  Filed Weights   12/07/16 0659 12/08/16 0645  Weight: 81.7 kg (180 lb 3.2 oz) 82.1 kg (181 lb)    History of present illness:  41 y.o.malewith medical history significant of HTN, schizophrenia, ETOH use, h/o cocaine use, seizures who presented for chest pain and acute psychosis.  Hospital Course:  1-chest discomfort: most likely from use of cocaine -patient with very little elevation of troponin X1 in setting of AKI and rhabdomyolysis.  -reassuring echocardiogram (no showing pericardial effusion or wall motion abnormalities) -EKG/telemetry w/o ischemic changes today. -high dose ASA and colchicine stopped -PRN acetaminophen for pain recommended; but at discharge no CP or SOB reported.  2-rhabdomyolysis and AKI -most likely associated with dehydration and use of recreational drugs -received aggressive IVF's -renal function back to normal and CK trending down at discharge -advise to keep himself well  hydrated.  3-schizophrenia -psych was consulted and clear patient for discharge  -patient denies hallucinations, SI or any other complaints. AAOX3. -appropriate interaction and contracted for safety with psych -felt to be induced by recreational drugs. -started on zyprexa and his depakote was resumed.  4-tobacco abuse -nicotine patch ordered at discharge -cessation counseling provided  5-EtOH dependence with AST elevation  -will recommend CMET at follow up visit to follow LFT's -CIWA protocol implemented during hospitalization and no withdrawal seen. -encouraged to use daily MV to supplement folic acid and thiamine.  6-GERD -continue PPI  7-cocaine and marijuana abuse -cessation counseling provided  Procedures:  2-D echo -Left ventricle: Wall thickness was increased in a pattern of mild LVH. Systolic function was normal. The estimated ejection fraction was in the range of 55% to 60%. Wall motion was normal; there were no regional wall motion abnormalities.  Consultations:  Psychiatry   Discharge Exam: Vitals:   12/08/16 0750 12/08/16 1100  BP:    Pulse: 67 77  Resp: 17   Temp:    SpO2: 100%     General: afebrile, oriented X3; no hallucinations currently. Reports no CP, no SOB.  Cardiovascular: S1 and S2, no rubs, no gallops, no JVD, no murmurs.  Respiratory: CTA bilaterally, no wheezing   Abdomen: soft, NT, ND, positive BS  Musculoskeletal: no edema, no cyanosis, no clubbing    Discharge Instructions   Discharge Instructions    Diet - low sodium heart healthy    Complete by:  As directed    Discharge instructions    Complete by:  As directed    Take medications as prescribed Arranged follow up with PCP in 10 days Please follow [facility] at [facility] as instructed, for further evaluation a nd treatment of your  mood disorder. Keep yourself well hydrated (1/2 of your weight in ounces of water) Please stop using recreational drugs and stop smoking      Current Discharge Medication List    START taking these medications   Details  acetaminophen (TYLENOL) 325 MG tablet Take 2 tablets (650 mg total) by mouth every 6 (six) hours as needed for mild pain, fever or headache. Qty: 40 tablet, Refills: 0    Multiple Vitamin (MULTIVITAMIN WITH MINERALS) TABS tablet Take 1 tablet by mouth daily. Qty: 30 tablet, Refills: 3    nicotine (NICODERM CQ - DOSED IN MG/24 HOURS) 21 mg/24hr patch Place 1 patch (21 mg total) onto the skin daily. Qty: 28 patch, Refills: 0    OLANZapine (ZYPREXA) 5 MG tablet Take 1 tablet (5 mg total) by mouth 2 (two) times daily. Qty: 30 tablet, Refills: 1    polyethylene glycol (MIRALAX / GLYCOLAX) packet Take 17 g by mouth daily as needed for moderate constipation. Qty: 28 each, Refills: 0      CONTINUE these medications which have CHANGED   Details  divalproex (DEPAKOTE ER) 500 MG 24 hr tablet Take 2 tablets (1,000 mg total) by mouth at bedtime. Qty: 60 tablet, Refills: 1    omeprazole (PRILOSEC) 40 MG capsule Take 1 capsule (40 mg total) by mouth daily. Qty: 30 capsule, Refills: 2   Associated Diagnoses: Gastroesophageal reflux disease with esophagitis    ranitidine (ZANTAC) 150 MG tablet Take 1 tablet (150 mg total) by mouth at bedtime. Qty: 30 tablet, Refills: 1      STOP taking these medications     doxycycline (VIBRAMYCIN) 100 MG capsule      sucralfate (CARAFATE) 1 GM/10ML suspension      polyethylene glycol powder (GLYCOLAX/MIRALAX) powder        No Known Allergies Follow-up Information    [facility] Follow up on 12/09/2016.   Why:  Please walk-in to [facility] Mon-Fri 8A-330P for follow up appointment. Contact information: [address] [place] [address] [phone]        Shirline Frees, NP. Schedule an appointment as soon as possible for a visit in 10 day(s).   Specialty:  Family Medicine Contact information: [address] [place] [address] [phone]             The results of significant diagnostics from this hospitalization (including imaging, microbiology, ancillary and laboratory) are listed below for reference.    Significant Diagnostic Studies: Dg Chest 2 View  Result Date: 12/06/2016 CLINICAL DATA:  Chest tightness began while walking. Tachycardia. History of alcohol abuse, hypertension, seizures. EXAM: CHEST  2 VIEW COMPARISON:  04/26/2016 FINDINGS: Pulmonary hyperinflation. The heart size and mediastinal contours are within normal limits. Both lungs are clear. The visualized skeletal structures are unremarkable. IMPRESSION: No active cardiopulmonary disease. Electronically Signed   By: Burman Nieves M.D.   On: 12/06/2016 01:18   US Abdomen Limited Ruq  Result Date: 12/06/2016 CLINICAL DATA:  Right upper quadrant pain for 2-3 days EXAM: ULTRASOUND ABDOMEN LIMITED RIGHT UPPER QUADRANT COMPARISON:  None. FINDINGS: Gallbladder: No gallstones or wall thickening visualized. No sonographic Murphy sign noted by sonographer. Common bile duct: Diameter: Normal caliber, 3 mm Liver: No focal lesion identified. Within normal limits in parenchymal echogenicity. Portal vein is patent on color Doppler imaging with normal direction of blood flow towards the liver. IMPRESSION: Normal right upper quadrant ultrasound. Electronically Signed   By: Charlett Nose M.D.   On: 12/06/2016 09:06    Microbiology:  No results found for this or any previous visit (from the past 240 hour(s)).   Labs: Basic Metabolic Panel:  Recent Labs Lab 12/06/16 0037 12/06/16 1536 12/07/16 0310 12/08/16 0258  NA 140 138 139 141  K 3.7 4.1 4.0 4.0  CL 101 104 109 110  CO2 GLUCOSE 108* 95 90 80  BUN CREATININE 1.50* 1.34* 1.30* 1.03  CALCIUM 10.1 8.9 8.4* 8.1*   Liver Function Tests:  Recent Labs Lab 12/06/16 0728 12/07/16 0310  AST 137* 331*  ALT 32 61  ALKPHOS 63 43  BILITOT 1.2 1.0  PROT 8.3* 5.9*  ALBUMIN 4.9 3.2*    Recent  Labs Lab 12/06/16 0728  LIPASE 31   CBC:  Recent Labs Lab 12/06/16 0037 12/06/16 1536 12/07/16 0310 12/08/16 0258  WBC 11.5* 10.9* 7.5 6.1  HGB 13.9 13.7 12.0* 11.3*  HCT 42.4 40.8 38.4* 35.7*  MCV 88.3 88.7 90.4 90.6  PLT 207 187 175 176   Cardiac Enzymes:  Recent Labs Lab 12/06/16 0728 12/06/16 1536 12/07/16 0310 12/08/16 0258  CKTOTAL 5,070* 10,952* 10,880* 8,846*  TROPONINI  --  0.05*  --   --      Signed:  Vassie Loll MD.  [facility] 12/08/2016, 12:49 PM

## 2016-12-09 ENCOUNTER — Emergency Department (HOSPITAL_COMMUNITY)
Admission: EM | Admit: 2016-12-09 | Discharge: 2016-12-10 | Disposition: A | Discharge status: Home / Self Care | Payer: Medicare HMO | Attending: Emergency Medicine | Admitting: Emergency Medicine

## 2016-12-09 ENCOUNTER — Encounter (HOSPITAL_COMMUNITY): Payer: Self-pay | Admitting: Nurse Practitioner

## 2016-12-09 ENCOUNTER — Telehealth: Payer: Self-pay

## 2016-12-09 DIAGNOSIS — F17210 Nicotine dependence, cigarettes, uncomplicated: Secondary | ICD-10-CM | POA: Insufficient documentation

## 2016-12-09 DIAGNOSIS — F22 Delusional disorders: Secondary | ICD-10-CM | POA: Diagnosis present

## 2016-12-09 DIAGNOSIS — R1012 Left upper quadrant pain: Secondary | ICD-10-CM | POA: Diagnosis not present

## 2016-12-09 DIAGNOSIS — I10 Essential (primary) hypertension: Secondary | ICD-10-CM | POA: Diagnosis not present

## 2016-12-09 DIAGNOSIS — R440 Auditory hallucinations: Secondary | ICD-10-CM | POA: Diagnosis not present

## 2016-12-09 DIAGNOSIS — F209 Schizophrenia, unspecified: Secondary | ICD-10-CM | POA: Diagnosis not present

## 2016-12-09 DIAGNOSIS — Z79899 Other long term (current) drug therapy: Secondary | ICD-10-CM | POA: Insufficient documentation

## 2016-12-09 DIAGNOSIS — R69 Illness, unspecified: Secondary | ICD-10-CM | POA: Diagnosis not present

## 2016-12-09 LAB — URINALYSIS, ROUTINE W REFLEX MICROSCOPIC
BACTERIA UA: NONE SEEN
Bilirubin Urine: NEGATIVE
Glucose, UA: NEGATIVE mg/dL
Ketones, ur: NEGATIVE mg/dL
Leukocytes, UA: NEGATIVE
Nitrite: NEGATIVE
PROTEIN: NEGATIVE mg/dL
Specific Gravity, Urine: 1.002 — ABNORMAL LOW (ref 1.005–1.030)
pH: 7 (ref 5.0–8.0)

## 2016-12-09 LAB — COMPREHENSIVE METABOLIC PANEL
ALBUMIN: 4 g/dL (ref 3.5–5.0)
ALK PHOS: 45 U/L (ref 38–126)
ALT: 160 U/L — ABNORMAL HIGH (ref 17–63)
ANION GAP: 8 (ref 5–15)
AST: 553 U/L — ABNORMAL HIGH (ref 15–41)
BUN: 5 mg/dL — ABNORMAL LOW (ref 6–20)
CALCIUM: 9.1 mg/dL (ref 8.9–10.3)
CO2: 25 mmol/L (ref 22–32)
Chloride: 104 mmol/L (ref 101–111)
Creatinine, Ser: 1.12 mg/dL (ref 0.61–1.24)
GFR calc Af Amer: >60 mL/min (ref >60)
GFR calc non Af Amer: >60 mL/min (ref >60)
GLUCOSE: 93 mg/dL (ref 65–99)
POTASSIUM: 3.8 mmol/L (ref 3.5–5.1)
SODIUM: 137 mmol/L (ref 135–145)
Total Bilirubin: 0.6 mg/dL (ref 0.3–1.2)
Total Protein: 7 g/dL (ref 6.5–8.1)

## 2016-12-09 LAB — CBC
HCT: 38.9 % — ABNORMAL LOW (ref 39.0–52.0)
HEMOGLOBIN: 12.5 g/dL — AB (ref 13.0–17.0)
MCH: 28.9 pg (ref 26.0–34.0)
MCHC: 32.1 g/dL (ref 30.0–36.0)
MCV: 89.8 fL (ref 78.0–100.0)
PLATELETS: 195 10*3/uL (ref 150–400)
RBC: 4.33 MIL/uL (ref 4.22–5.81)
RDW: 13.1 % (ref 11.5–15.5)
WBC: 7.3 10*3/uL (ref 4.0–10.5)

## 2016-12-09 LAB — RAPID URINE DRUG SCREEN, HOSP PERFORMED
Amphetamines: NOT DETECTED
BENZODIAZEPINES: NOT DETECTED
Barbiturates: NOT DETECTED
Cocaine: NOT DETECTED
OPIATES: NOT DETECTED
Tetrahydrocannabinol: NOT DETECTED

## 2016-12-09 LAB — ACETAMINOPHEN LEVEL: Acetaminophen (Tylenol), Serum: <10 ug/mL — ABNORMAL LOW (ref 10–30)

## 2016-12-09 LAB — LIPASE, BLOOD: Lipase: 49 U/L (ref 11–51)

## 2016-12-09 LAB — SALICYLATE LEVEL: Salicylate Lvl: <7 mg/dL (ref 2.8–30.0)

## 2016-12-09 LAB — I-STAT TROPONIN, ED: Troponin i, poc: 0 ng/mL (ref 0.00–0.08)

## 2016-12-09 LAB — ETHANOL: Alcohol, Ethyl (B): <5 mg/dL (ref <5)

## 2016-12-09 MED ORDER — OLANZAPINE 5 MG PO TABS
5.0000 mg | ORAL_TABLET | Freq: Once | ORAL | Status: AC
  Administered 2016-12-10: 5 mg via ORAL
  Filled 2016-12-09: qty 1

## 2016-12-09 MED ORDER — DIVALPROEX SODIUM ER 500 MG PO TB24
500.0000 mg | ORAL_TABLET | Freq: Once | ORAL | Status: DC
  Filled 2016-12-09: qty 1

## 2016-12-09 MED ORDER — LORAZEPAM 1 MG PO TABS
0.5000 mg | ORAL_TABLET | Freq: Once | ORAL | Status: AC
  Administered 2016-12-09: 0.5 mg via ORAL
  Filled 2016-12-09: qty 1

## 2016-12-09 MED ORDER — DIVALPROEX SODIUM ER 500 MG PO TB24
1000.0000 mg | ORAL_TABLET | Freq: Every day | ORAL | Status: DC
Start: 2016-12-10 — End: 2016-12-10
  Administered 2016-12-10: 1000 mg via ORAL
  Filled 2016-12-09: qty 2

## 2016-12-09 NOTE — ED Provider Notes (Signed)
[facility]-EMERGENCY DEPT Provider Note   CSN: [id number] Arrival date & time: 12/09/16  1824     History   Chief Complaint Chief Complaint  Patient presents with  . Paranoid  . Chest Pain    HPI Alfred [name] is a 41 y.o. male.  This is a 41 year old male with complex PMH including multiple psychiatric comorbidities now on Depakote after recent discharge from the hospital, alcohol dependence, schizophrenia, who presents with intermittent spitting and LUQ abdominal pain which began earlier today.  Briefly, patient was discharged from the hospital yesterday after a 3 day admission for paranoia and hallucinations.  Patient was previously not compliant with his Depakote or Risperdal and was abusing cocaine and cannabis.  He was discharged with Depakote 500 mg twice daily for his seizure disorder and Risperdal was discontinued.  Zyprexa was begun for psychosis and delusions.  Patient states he has been compliant with his medications and he was able to recite his dosages.  He denies any chest pain, hallucinations, paranoia at this time.  Denies any SI, HI.  Denies any change in bowel movements, dysuria, hematuria.      Past Medical History:  Diagnosis Date  . Alcohol abuse   . Depression   . Hypertension   . Migraines   . Schizophrenia (HCC)   . Seizure (HCC)    alcoho    Patient Active Problem List   Diagnosis Date Noted  . Gastroesophageal reflux disease with esophagitis   . Cocaine abuse w/cocaine-induced psychotic disorder w/hallucinations (HCC) 12/07/2016  . Cannabis abuse with psychotic disorder (HCC) 12/07/2016  . Pericarditis 12/06/2016  . Rhabdomyolysis 12/06/2016  . EtOH dependence (HCC) 12/06/2016  . Transient alteration of awareness 08/28/2016  . Jerky body movements 08/28/2016  . Seizures (HCC) 06/18/2016  . Internal hemorrhoids 09/30/2013  . Acute psychosis 05/29/2006  . TOBACCO DEPENDENCE 05/29/2006    Past Surgical History:  Procedure Laterality Date  .  WISDOM TOOTH EXTRACTION         Home Medications    Prior to Admission medications   Medication Sig Start Date End Date Taking? Authorizing Provider  acetaminophen (TYLENOL) 325 MG tablet Take 2 tablets (650 mg total) by mouth every 6 (six) hours as needed for mild pain, fever or headache. 12/08/16  Yes Vassie Loll, MD  divalproex (DEPAKOTE ER) 500 MG 24 hr tablet Take 2 tablets (1,000 mg total) by mouth at bedtime. 12/08/16  Yes Vassie Loll, MD  Multiple Vitamin (MULTIVITAMIN WITH MINERALS) TABS tablet Take 1 tablet by mouth daily. 12/09/16  Yes Vassie Loll, MD  nicotine (NICODERM CQ - DOSED IN MG/24 HOURS) 21 mg/24hr patch Place 1 patch (21 mg total) onto the skin daily. 12/09/16  Yes Vassie Loll, MD  OLANZapine (ZYPREXA) 5 MG tablet Take 1 tablet (5 mg total) by mouth 2 (two) times daily. 12/08/16  Yes Vassie Loll, MD  omeprazole (PRILOSEC) 40 MG capsule Take 1 capsule (40 mg total) by mouth daily. 12/08/16  Yes Vassie Loll, MD  polyethylene glycol [facility] / GLYCOLAX) packet Take 17 g by mouth daily as needed for moderate constipation. 12/08/16  Yes Vassie Loll, MD  ranitidine (ZANTAC) 150 MG tablet Take 1 tablet (150 mg total) by mouth at bedtime. 12/08/16  Yes Vassie Loll, MD    Family History Family History  Problem Relation Age of Onset  . Diabetes Mother   . Hypertension Mother   . Hyperlipidemia Mother   . Anemia Mother   . Heart murmur Mother   . Colon  polyps Maternal Uncle   . Heart disease Maternal Uncle   . Celiac disease Maternal Grandmother   . Diabetes Maternal Grandmother   . Diabetes Maternal Grandfather     Social History Social History  Substance Use Topics  . Smoking status: Current Every Day Smoker    Packs/day: 1.00    Types: Cigarettes  . Smokeless tobacco: Never Used  . Alcohol use Yes     Allergies   Patient has no known allergies.   Review of Systems Review of Systems  Constitutional: Negative for chills, diaphoresis and fever.   HENT: Negative for ear pain and sore throat.   Eyes: Negative for pain and visual disturbance.  Respiratory: Negative for cough, shortness of breath and wheezing.   Cardiovascular: Negative for chest pain, palpitations and leg swelling.  Gastrointestinal: Positive for nausea. Negative for abdominal pain, blood in stool, constipation, diarrhea and vomiting.  Genitourinary: Negative for difficulty urinating, dysuria, flank pain and hematuria.  Musculoskeletal: Negative for arthralgias and back pain.  Skin: Negative for color change and rash.  Neurological: Positive for tremors, weakness and numbness. Negative for seizures and syncope.  Psychiatric/Behavioral: Positive for dysphoric mood, hallucinations and sleep disturbance. Negative for agitation, confusion, self-injury and suicidal ideas. The patient is nervous/anxious.   All other systems reviewed and are negative.    Physical Exam Updated Vital Signs BP 120/71   Pulse 100   Temp 99.2 F (37.3 C) (Oral)   Resp 18   SpO2 97%   Physical Exam  Constitutional: He appears well-developed and well-nourished.  HENT:  Head: Normocephalic and atraumatic.  Eyes: Conjunctivae are normal.  Neck: Neck supple.  Cardiovascular: Normal rate and regular rhythm.   No murmur heard. Pulmonary/Chest: Effort normal and breath sounds normal. No respiratory distress.  Abdominal: Soft. There is no tenderness.  Musculoskeletal: He exhibits no edema.  Neurological: He is alert.  Skin: Skin is warm and dry.  Psychiatric: He has a normal mood and affect. His mood appears not anxious. His affect is not angry, not blunt, not labile and not inappropriate. His speech is not rapid and/or pressured, not delayed, not tangential and not slurred. He is not agitated, not aggressive, not hyperactive, not slowed, not withdrawn, not actively hallucinating and not combative. Thought content is not paranoid and not delusional. Cognition and memory are not impaired. He  does not exhibit a depressed mood. He expresses no homicidal and no suicidal ideation. He expresses no suicidal plans and no homicidal plans. He is communicative. He is attentive.  Nursing note and vitals reviewed.    ED Treatments / Results  Labs (all labs ordered are listed, but only abnormal results are displayed) Labs Reviewed  CBC - Abnormal; Notable for the following:       Result Value   Hemoglobin 12.5 (*)    HCT 38.9 (*)    All other components within normal limits  COMPREHENSIVE METABOLIC PANEL - Abnormal; Notable for the following:    BUN 5 (*)    AST 553 (*)    ALT 160 (*)    All other components within normal limits  ACETAMINOPHEN LEVEL - Abnormal; Notable for the following:    Acetaminophen (Tylenol), Serum <10 (*)    All other components within normal limits  URINALYSIS, ROUTINE W REFLEX MICROSCOPIC - Abnormal; Notable for the following:    Color, Urine STRAW (*)    Specific Gravity, Urine 1.002 (*)    Hgb urine dipstick MODERATE (*)    Squamous Epithelial /  LPF 0-5 (*)    All other components within normal limits  ETHANOL  SALICYLATE LEVEL  RAPID URINE DRUG SCREEN, HOSP PERFORMED  LIPASE, BLOOD  I-STAT TROPONIN, ED    EKG  EKG Interpretation None       Radiology No results found.  Procedures Procedures (including critical care time)  Medications Ordered in ED Medications  OLANZapine (ZYPREXA) tablet 5 mg (not administered)  divalproex (DEPAKOTE ER) 24 hr tablet 1,000 mg (not administered)  LORazepam (ATIVAN) tablet 0.5 mg (0.5 mg Oral Given 12/09/16 2223)     Initial Impression / Assessment and Plan / ED Course  I have reviewed the triage vital signs and the nursing notes.  Pertinent labs & imaging results that were available during my care of the patient were reviewed by me and considered in my medical decision making (see chart for details).     This is a 41 year old male with complex PMH including multiple psychiatric comorbidities now  on Depakote after recent discharge from the hospital, alcohol dependence, schizophrenia, who presents with intermittent spitting and LUQ abdominal pain which began earlier today.  Exam as noted above.  No right upper quadrant abdominal pain.  His abdomen is benign on exam with no tenderness in the LUQ or epigastrium. Patient psychiatric exam largely unremarkable.  He has no active SI or HI.  He displays good judgment, makes eye contact, no active hallucinations and displays normal thought content with no paranoia or delusions.  He states his hallucinations happen occasionally but he willingly admits he has only recently started his Zyprexa as of yesterday and he understands that it will take a few weeks to take further effect.  Patient already followed [facility] with [facility] today and he has a follow-up appointment with his PCP 2 days from now.  He has a plan for going to crisis center if he has worsening hallucinations or thoughts of self-harm.  Family members are in agreement in the room for this plan. Patient has been taking Depakote and Zyprexa as prescribed, he was able to recite his dosages to me at bedside.  He has only taken 1 Depakote in the past 24 hours.  Urine studies unremarkable with no hematuria or infectious etiology noted.  His rapid drug screen is negative, lipase negative.  His AST and ALT are slightly elevated, this is possibly due to transaminitis secondary to Depakote as well as residual damage from his rhabdomyolysis.  The patient has no active right upper quadrant pain, there is no elevation in his bilirubin or alkaline phosphatase and therefore no further testing is indicated at this time.  He has scheduled PCP follow-up in 2 days. Patient given his night doses of Zyprexa and Depakote here in the ED.  Given well-appearing patient who psychologically intact with no imminent risk to self or others at the present time he does not meet criteria for further emergent or inpatient psychiatric  admission.  I encouraged supportive therapy at [facility] for his ongoing stressors as well as family support which was relayed to his family members at bedside. Return precautions given and all questions were answered prior to discharge.  Patient in full agreement and verbally recited return precautions.  Final Clinical Impressions(s) / ED Diagnoses   Final diagnoses:  Auditory hallucinations  Left upper quadrant pain    New Prescriptions New Prescriptions   No medications on file     Shaune PollackBriggs, Shandrika Ambers, MD 12/10/16 Michaelle Birks0010    Pfeiffer, Marcy, MD 12/12/16 1910

## 2016-12-09 NOTE — ED Notes (Signed)
Called lab - they can Add-On lipase to blood already in lab.

## 2016-12-09 NOTE — ED Triage Notes (Signed)
Pt presents with [employer] for paranoia. The patient stopped [employer] officer this afternoon asking him to bring him to the hospital, telling the officer that someone is following him and trying to hurt him and his family. The patient c/o chest pain and itching all over his body on arrival to ED. He reports thoughts of harming himself and others, but will not states a plan. He states his mental health providers have recently adjusted doses and changed some of his psych medications. He admits to heavy alcohol use in the past but denies alcohol use for the past 2 weeks.

## 2016-12-09 NOTE — Telephone Encounter (Signed)
Attempted to reach patient to complete TCM follow-up call. Unable to reach patient. Message left on voicemail asking patient to return call to office.   

## 2016-12-09 NOTE — ED Notes (Signed)
Pt

## 2016-12-10 ENCOUNTER — Telehealth: Payer: Self-pay | Admitting: Neurology

## 2016-12-10 DIAGNOSIS — R1012 Left upper quadrant pain: Secondary | ICD-10-CM | POA: Diagnosis not present

## 2016-12-10 DIAGNOSIS — I10 Essential (primary) hypertension: Secondary | ICD-10-CM | POA: Diagnosis not present

## 2016-12-10 DIAGNOSIS — R440 Auditory hallucinations: Secondary | ICD-10-CM | POA: Diagnosis not present

## 2016-12-10 DIAGNOSIS — R69 Illness, unspecified: Secondary | ICD-10-CM | POA: Diagnosis not present

## 2016-12-10 DIAGNOSIS — Z79899 Other long term (current) drug therapy: Secondary | ICD-10-CM | POA: Diagnosis not present

## 2016-12-10 NOTE — Telephone Encounter (Signed)
Patient mother needs to talk to someone about where we referred patient and what is going with patient

## 2016-12-10 NOTE — Telephone Encounter (Signed)
Spoke with pt's mother.  Pt was recently admitted to the hospital and she thinks he needs to be seen by Dr. Karel JarvisAquino. Per hospital notes, pt non compliant with his medications.  Scheduled for Thursday at 11am

## 2016-12-10 NOTE — Telephone Encounter (Signed)
Attempted to reach patient to complete TCM follow-up call. Unable to reach patient. Message left on voicemail asking patient to return call to office.   

## 2016-12-10 NOTE — ED Notes (Signed)
Pt informed RN to get belongings from security. RN waiting for security to come, pt left without belongings from security. Clothing was given back to pt.

## 2016-12-10 NOTE — ED Provider Notes (Signed)
I saw and evaluated the patient, reviewed the resident's note and I agree with the findings and plan.   EKG Interpretation None     Patient has medical history complicated by psychiatric comorbidity. Patient presented with complaint of left upper quadrant pain and also described pain in all his extremities. On examination the patient is alert and interactive. He is not acutely psychotic. Heart and lung exam normal. Abdomen soft and nontender. Extremities are good condition without peripheral edema or evident injury. Agree with plan and management.   Arby BarrettePfeiffer, Aarya Robinson, MD 12/12/16 [phone]

## 2016-12-11 DIAGNOSIS — R69 Illness, unspecified: Secondary | ICD-10-CM | POA: Diagnosis not present

## 2016-12-11 NOTE — Telephone Encounter (Signed)
Attempted to reach patient to complete TCM follow-up call. Unable to reach patient. Message left on voicemail asking patient to return call to office.   

## 2016-12-12 ENCOUNTER — Ambulatory Visit (INDEPENDENT_AMBULATORY_CARE_PROVIDER_SITE_OTHER): Payer: Medicare HMO | Admitting: Adult Health

## 2016-12-12 ENCOUNTER — Encounter: Payer: Self-pay | Admitting: Adult Health

## 2016-12-12 ENCOUNTER — Ambulatory Visit (INDEPENDENT_AMBULATORY_CARE_PROVIDER_SITE_OTHER): Payer: Medicare HMO | Admitting: Neurology

## 2016-12-12 ENCOUNTER — Telehealth: Payer: Self-pay | Admitting: Adult Health

## 2016-12-12 ENCOUNTER — Encounter: Payer: Self-pay | Admitting: Neurology

## 2016-12-12 VITALS — BP 112/72 | HR 66 | Temp 98.2°F | Ht 72.0 in | Wt 194.2 lb

## 2016-12-12 VITALS — BP 108/72 | HR 59 | Ht 78.0 in | Wt 193.0 lb

## 2016-12-12 DIAGNOSIS — R404 Transient alteration of awareness: Secondary | ICD-10-CM | POA: Diagnosis not present

## 2016-12-12 DIAGNOSIS — M6282 Rhabdomyolysis: Secondary | ICD-10-CM

## 2016-12-12 DIAGNOSIS — R748 Abnormal levels of other serum enzymes: Secondary | ICD-10-CM

## 2016-12-12 LAB — COMPREHENSIVE METABOLIC PANEL
ALT: 111 U/L — ABNORMAL HIGH (ref 0–53)
AST: 219 U/L — AB (ref 0–37)
Albumin: 4.1 g/dL (ref 3.5–5.2)
Alkaline Phosphatase: 46 U/L (ref 39–117)
BUN: 11 mg/dL (ref 6–23)
CHLORIDE: 105 meq/L (ref 96–112)
CO2: 33 meq/L — AB (ref 19–32)
CREATININE: 1.02 mg/dL (ref 0.40–1.50)
Calcium: 9.6 mg/dL (ref 8.4–10.5)
GFR: 102.73 mL/min (ref >60.00)
GLUCOSE: 63 mg/dL — AB (ref 70–99)
POTASSIUM: 4.9 meq/L (ref 3.5–5.1)
Sodium: 142 mEq/L (ref 135–145)
Total Bilirubin: 0.3 mg/dL (ref 0.2–1.2)
Total Protein: 6.3 g/dL (ref 6.0–8.3)

## 2016-12-12 LAB — POC URINALSYSI DIPSTICK (AUTOMATED)
BILIRUBIN UA: NEGATIVE
Blood, UA: NEGATIVE
CLARITY UA: NEGATIVE
GLUCOSE UA: NEGATIVE
KETONES UA: NEGATIVE
LEUKOCYTES UA: NEGATIVE
NITRITE UA: NEGATIVE
Protein, UA: NEGATIVE
Spec Grav, UA: 1.02 (ref 1.010–1.025)
Urobilinogen, UA: 0.2 E.U./dL
pH, UA: 7.5 (ref 5.0–8.0)

## 2016-12-12 LAB — CK: Total CK: 3732 U/L — ABNORMAL HIGH (ref 7–232)

## 2016-12-12 NOTE — Telephone Encounter (Signed)
I called [facility]. They advised [employer] will only cover Zyprexa QD not BID. Initiate PA or change med?

## 2016-12-12 NOTE — Patient Instructions (Signed)
It was great seeing you today and I am happy that you are starting to feel better  Rest and stay hydrated!  Follow up with me whenever you needed  I will follow up with you regarding blood work

## 2016-12-12 NOTE — Progress Notes (Signed)
Subjective:    Patient ID: Alfred [name], male    DOB: 1975-11-24, 41 y.o.   MRN: [id number]  HPI  41 year old male who  has a past medical history of Alcohol abuse; Depression; Hypertension; Migraines; Schizophrenia (HCC); and Seizure (HCC). He presents to the office today for TCM visit. He was admitted on 12/06/2016- 12/08/2016 for cocaine abuse w/cocaine-induced psychotic disorder w/hallucinations and AKI related to Rhabdomyolysis. He presents to this office visit with his parents who help provide history   Hospital Course included that of   1. Chest discomfort - most likely from use of cocaine - He had very little elevation of troponin x 1 in setting of AKE with rhabdo - Echo was reassuring  - EKG with no ischemic changes  - High Dose ASA and colchicine stopped.   2. Rhabdomyolysis  and AKI -  Associated with dehydration and drug use  - Received IVF  - Renal function had returned to normal and CK was trending down at discharge   3. Schizophrenia  - Psych was consulted and cleared patient for d/c  - Felt to be induced by recreational drug use  - Started on Zyprexa and his Depakote was resumed   Imagining   2-D echo -Left ventricle: Wall thickness was increased in a pattern of mild LVH. Systolic function was normal. The estimated ejection fraction was in the range of 55% to 60%. Wall motion was normal; there were no regional wall motion abnormalities.  He was advised to [facility], which he has already done and he has additional follow up in one month.   He is also following with with Dr. Karel JarvisAquino this morning   Today in the office he reports that he is starting to feel better. He has been taking his medications as directed and can rename the medications, does, and times. He does report episodes of headaches and nausea since starting his psych medications. He does have episodes of visual and auditory hallucinations, reports " today hasn't been  this bad." Denies any SI or HI. He continues to complain of intermittent low back pain.     Review of Systems  Constitutional: Negative.   Respiratory: Negative.   Cardiovascular: Negative.   Gastrointestinal: Negative.   Genitourinary: Negative.   Musculoskeletal: Positive for back pain.  Neurological: Negative.   All other systems reviewed and are negative.  Past Medical History:  Diagnosis Date  . Alcohol abuse   . Depression   . Hypertension   . Migraines   . Schizophrenia (HCC)   . Seizure (HCC)    alcoho    Social History   Social History  . Marital status: Single    Spouse name: N/A  . Number of children: 3  . Years of education: N/A   Occupational History  . Unemployed    Social History Main Topics  . Smoking status: Current Some Day Smoker    Packs/day: 1.00    Types: Cigarettes  . Smokeless tobacco: Never Used  . Alcohol use Yes  . Drug use: Yes    Types: Marijuana  . Sexual activity: Not on file   Other Topics Concern  . Not on file   Social History Narrative  . No narrative on file    Past Surgical History:  Procedure Laterality Date  . WISDOM TOOTH EXTRACTION      Family History  Problem Relation Age of Onset  . Diabetes Mother   . Hypertension Mother   .  Hyperlipidemia Mother   . Anemia Mother   . Heart murmur Mother   . Colon polyps Maternal Uncle   . Heart disease Maternal Uncle   . Celiac disease Maternal Grandmother   . Diabetes Maternal Grandmother   . Diabetes Maternal Grandfather     No Known Allergies  Current Outpatient Prescriptions on File Prior to Visit  Medication Sig Dispense Refill  . acetaminophen (TYLENOL) 325 MG tablet Take 2 tablets (650 mg total) by mouth every 6 (six) hours as needed for mild pain, fever or headache. 40 tablet 0  . divalproex (DEPAKOTE ER) 500 MG 24 hr tablet Take 2 tablets (1,000 mg total) by mouth at bedtime. 60 tablet 1  . Multiple Vitamin (MULTIVITAMIN WITH MINERALS) TABS tablet Take  1 tablet by mouth daily. 30 tablet 3  . nicotine (NICODERM CQ - DOSED IN MG/24 HOURS) 21 mg/24hr patch Place 1 patch (21 mg total) onto the skin daily. 28 patch 0  . omeprazole (PRILOSEC) 40 MG capsule Take 1 capsule (40 mg total) by mouth daily. 30 capsule 2  . polyethylene glycol (MIRALAX / GLYCOLAX) packet Take 17 g by mouth daily as needed for moderate constipation. 28 each 0  . ranitidine (ZANTAC) 150 MG tablet Take 1 tablet (150 mg total) by mouth at bedtime. 30 tablet 1   No current facility-administered medications on file prior to visit.     BP 112/72   Pulse 66   Temp 98.2 F (36.8 C) (Oral)   Ht 6' (1.829 m)   Wt 194 lb 3.2 oz (88.1 kg)   SpO2 97%   BMI 26.34 kg/m       Objective:   Physical Exam  Constitutional: He is oriented to person, place, and time. He appears well-developed and well-nourished. No distress.  Eyes: Pupils are equal, round, and reactive to light. Conjunctivae and EOM are normal.  Cardiovascular: Normal rate, regular rhythm, normal heart sounds and intact distal pulses.  Exam reveals no gallop and no friction rub.   No murmur heard. Pulmonary/Chest: Effort normal and breath sounds normal. No respiratory distress. He has no wheezes. He has no rales. He exhibits no tenderness.  Musculoskeletal: Normal range of motion. He exhibits no edema, tenderness or deformity.  Neurological: He is alert and oriented to person, place, and time.  Skin: Skin is warm and dry. No rash noted. He is not diaphoretic. No erythema. No pallor.  Psychiatric: He has a normal mood and affect. His speech is normal. Judgment and thought content normal. He is slowed. Cognition and memory are normal.  Nursing note and vitals reviewed.     Assessment & Plan:  Alfred [name] is more engaged today then I have seen him in the past. He makes good eye contact and is verbal. He understands what happened in the hospital and  He is more willing to ask for help that he knows he needs. He has been  compliant with his medications since being discharged. We reviewed his medications and side effects, as well as went over labs and imaging that was done in the hospital. I am going to recheck a CMP and CK today and get a UA. Advised him to rest and stay hydrated. I am here to help in anyway I can but encouraged to continue with his appointments at [facility]. He was very appreciative of all the help he has been receiving    Shirline Frees, NP

## 2016-12-12 NOTE — Telephone Encounter (Signed)
Admit date: 12/06/2016 Discharge date: 12/08/2016 Recommendations for Outpatient Follow-up:  Repeat CK level. Check CMET to follow electrolytes, LFT's and renal function. Discharge Diagnoses:  Principal Problem:   Cocaine abuse w/cocaine-induced psychotic disorder w/hallucinations (HCC) Active Problems:   Acute psychosis   TOBACCO DEPENDENCE   Seizures (HCC)   Pericarditis   Rhabdomyolysis   EtOH dependence (HCC)   Cannabis abuse with psychotic disorder (HCC)   Gastroesophageal reflux disease with esophagitis  Discharge Condition: stable and improved. No CP, hallucinations, SI or SOB at discharge. Patient discharge home with mother. Instructions given to arrange follow up with PCP and to [facility] for further assistance and adjustments on psychiatry medications.   Spoke with pt and he states that he is doing well. He has been taking all his medications as directed. He did ask to review them and that was done. Advised pt that he will get a full list with instructions at his OV. [facility] has [facility] to [facility] assessment and he states they would like to see him every 30 days. He had questions about his diet and that was reviewed. He is very concerned about "being able to tell the difference between threats in his head and real ones". Advised pt that continuing with therapy should help him be able to tell the difference and learn coping/managing skills. Also advised pt to find things that interest him ie. music, reading, drawing or other hobbies that he can focus on. Reinforced need for pt to continue all medications and keep appts with [facility]. Pt states that he does have a good support system with his parents.   Transition Care Management Follow-up Telephone Call   Date discharged? 12/08/16   How have you been since you were released from the hospital? Good, improving   Do you understand why you were in the hospital? yes   Do you understand the discharge instructions?  yes   Where were you discharged to? home   Items Reviewed:  Medications reviewed: yes  Allergies reviewed: yes  Dietary changes reviewed: yes  Referrals reviewed: yes   Functional Questionnaire:   Activities of Daily Living (ADLs):   He states they are independent in the following: ambulation, bathing and hygiene, feeding, continence, grooming, toileting and dressing States they require assistance with the following: none   Any transportation issues/concerns?: no   Any patient concerns? Yes, addressed and advised him to also speak with Kandee Keenory at OV   Confirmed importance and date/time of follow-up visits scheduled yes  Provider Appointment booked with  Confirmed with patient if condition begins to worsen call PCP or go to the ER.  Patient was given the office number and encouraged to call back with question or concerns.  : yes

## 2016-12-12 NOTE — Telephone Encounter (Signed)
[facility]

## 2016-12-12 NOTE — Patient Instructions (Signed)
1. Continue taking your medications regularly 2. Avoid any substances that can potentially lead to strokes and heart attacks, such as cocaine 3. Follow-up in 6 months, call for any changes
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
## 2016-12-12 NOTE — Telephone Encounter (Signed)
Mom called to advise the pharmacy will not refill the OLANZapine (ZYPREXA) 10 MG tablet  They say too many pills.  Mom states she was told to call our office if she had any problems, because they need to get this rx before the storm.  [id number]- [place], [place] - [address]

## 2016-12-25 ENCOUNTER — Telehealth: Payer: Self-pay | Admitting: Adult Health

## 2016-12-25 NOTE — Telephone Encounter (Signed)
Pts mother is calling wanting to get a refill for TYLENOL for pt and pts mother was advised that this is a OTC regular tylenol type of medication and she state that she will look for at the pharmacy.  Pts mother would like to have a call back to verify it.

## 2016-12-26 NOTE — Telephone Encounter (Signed)
Spoke to [name] (mother) and she informed me that she went to the pharmacy and picked up OTC acetaminophen.  No further action needed.  Will close note.

## 2017-01-29 DIAGNOSIS — R69 Illness, unspecified: Secondary | ICD-10-CM | POA: Diagnosis not present

## 2017-03-07 ENCOUNTER — Telehealth: Payer: Self-pay | Admitting: Adult Health

## 2017-03-07 MED ORDER — RANITIDINE HCL 150 MG PO TABS: 150.0000 mg | ORAL_TABLET | Freq: Every day | ORAL | 1 refills | Status: DC

## 2017-03-07 NOTE — Telephone Encounter (Signed)
Contacted pt make him aware that refill request had been sent to [facility] [place]; pt verbalizes understanding; also left message pt's mother's voice mail prior to contacting pt.

## 2017-03-16 ENCOUNTER — Other Ambulatory Visit: Payer: Self-pay | Admitting: Adult Health

## 2017-03-17 NOTE — Telephone Encounter (Signed)
Sent in on 03/07/17

## 2017-03-18 ENCOUNTER — Telehealth: Payer: Self-pay | Admitting: Family Medicine

## 2017-03-18 NOTE — Telephone Encounter (Signed)
Noted  

## 2017-03-18 NOTE — Telephone Encounter (Signed)
Copied from CRM [phone]. Topic: Quick Communication - Rx Refill/Question >> Mar 18, 2017 12:54 PM Alexander BergeronBarksdale, Harvey B wrote: Pt's mom called about a left message apparently that was left on the 7th, pt's mom said the pharmacy did not receive the Rx but she will call pharmacy to confirm, if they do not have it she will call back

## 2017-03-19 ENCOUNTER — Telehealth: Payer: Self-pay | Admitting: Adult Health

## 2017-03-19 ENCOUNTER — Other Ambulatory Visit: Payer: Self-pay | Admitting: *Deleted

## 2017-03-19 DIAGNOSIS — K2100 Gastro-esophageal reflux disease with esophagitis, without bleeding: Secondary | ICD-10-CM

## 2017-03-19 MED ORDER — RANITIDINE HCL 150 MG PO TABS: 150.0000 mg | ORAL_TABLET | Freq: Every day | ORAL | 1 refills | Status: DC

## 2017-03-19 NOTE — Telephone Encounter (Signed)
Attempted to contact pt's mother, Gillermina PhyDiane Fraizer left message on voice mail Marya LandryFraizer, [phone]; see CRM [phone]; spoke with Nettie El[facility]Sylvia at [facility] regarding this issue..Marland Kitchen

## 2017-03-19 NOTE — Telephone Encounter (Signed)
Route to [facility] pool.

## 2017-03-19 NOTE — Telephone Encounter (Signed)
There is no documentation as to what patient needs.  Thanks!

## 2017-03-19 NOTE — Telephone Encounter (Signed)
Called and spoke to the pharmacy.  Rx for ranitidine was NOT received on 03/07/17.  Called in while on the phone.  Rx will be available to pick up at the pharmacy.

## 2017-03-19 NOTE — Telephone Encounter (Signed)
I was only asked about approving a refill on Prilosec, I said [facility] will have [facility] or his assistant for approval, that was it.

## 2017-03-19 NOTE — Addendum Note (Signed)
Addended by: Raj JanusADKINS, Toccara Alford T on: 03/19/2017 03:49 PM   Modules accepted: Orders

## 2017-03-19 NOTE — Progress Notes (Signed)
Spoke with Nettie ElmSylvia at [facility] rega rding pt refill refill request; pt previously seen 12/12/16 by Angela Adamory Nafzinger; she will route to provider to determine if pt needs to be seen in the office before refills can be granted; left message on mother's voice Vira Brownsmail,diane Fraizer, [phone].

## 2017-05-08 IMAGING — DX DG NECK SOFT TISSUE
2 series · 2 of 2 positions shown · non-contrast
Comparison: CT 10/24/2013 .

CLINICAL DATA: Sensation of foreign body.

EXAM:
NECK SOFT TISSUES - 1+ VIEW

[w soft tissue neck ap]
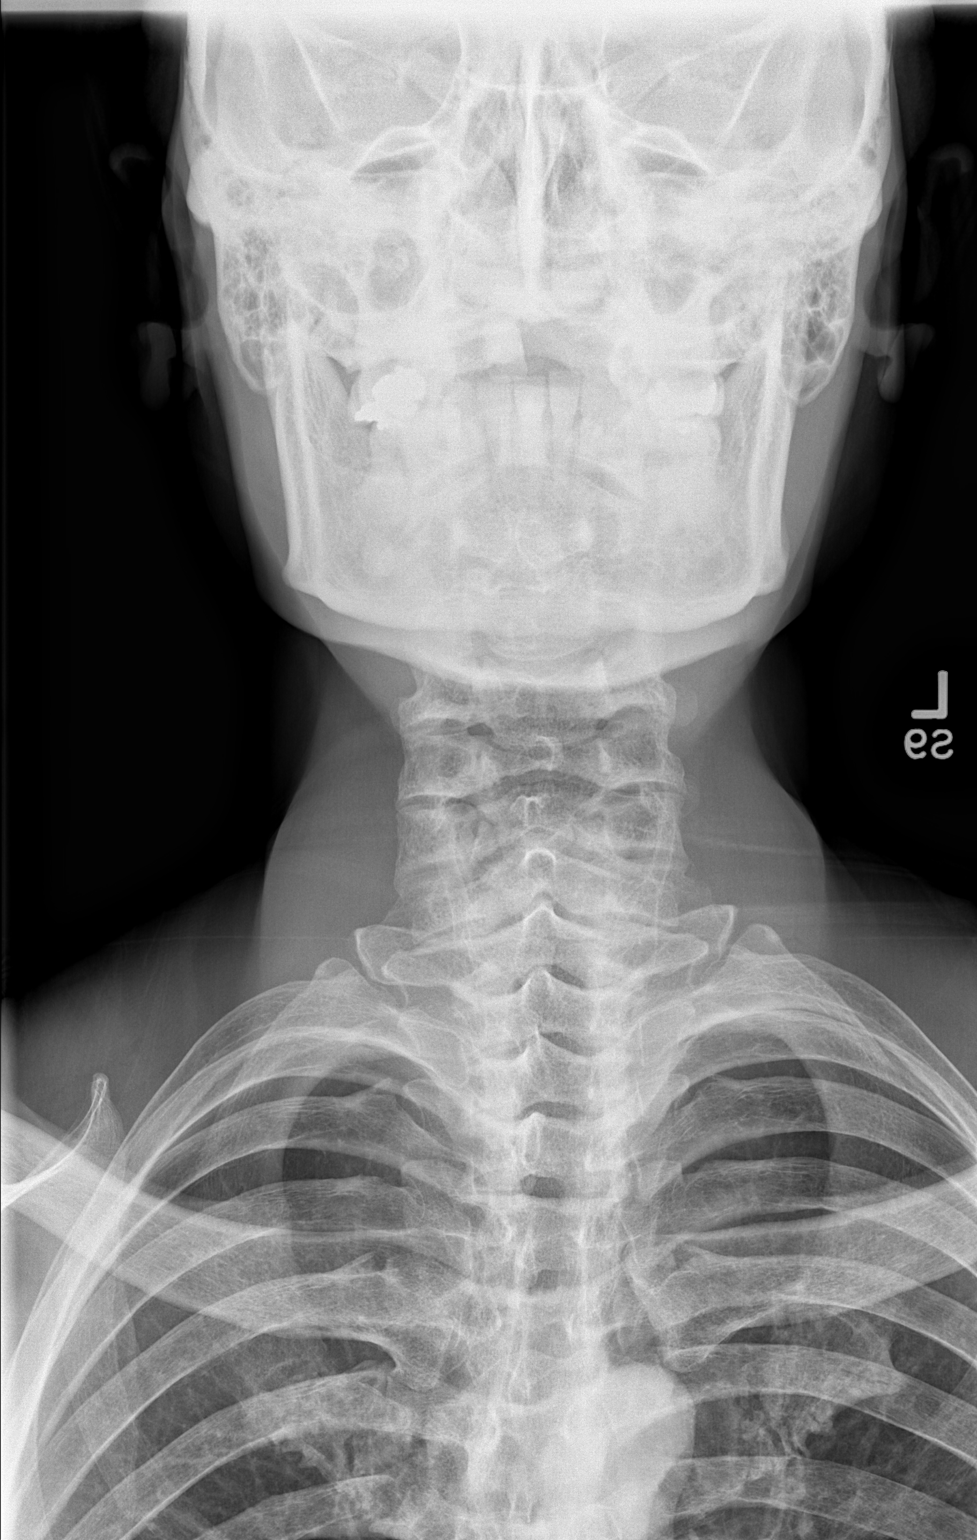

[w soft tissue neck lat]
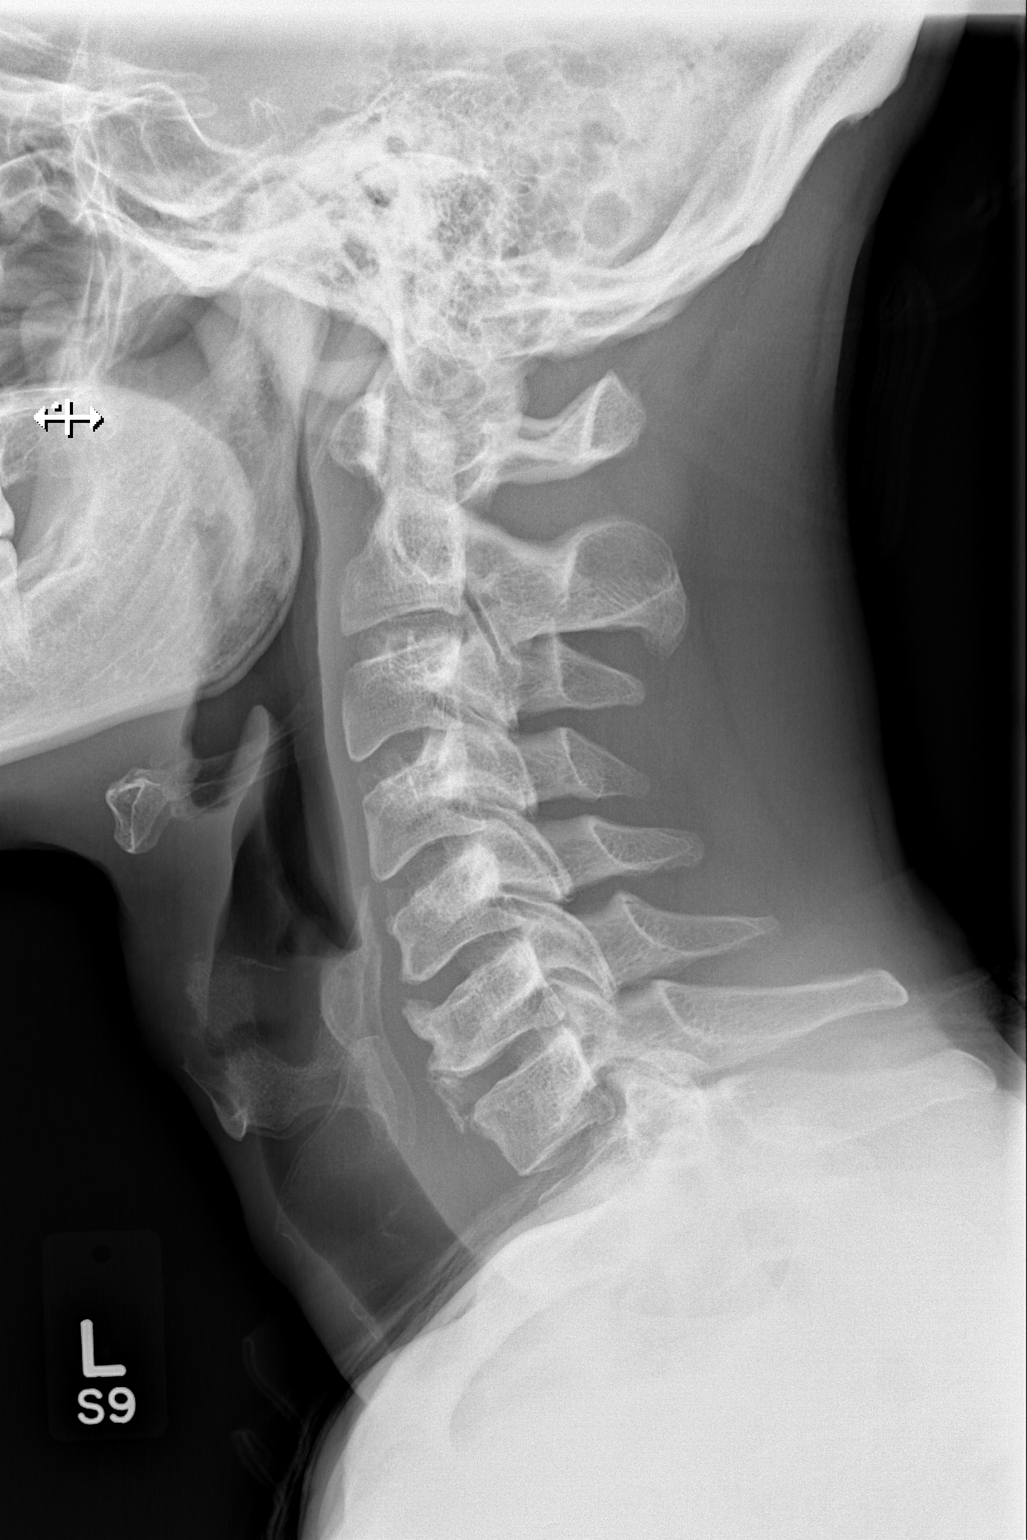

[2 of 2 positions shown; findings below may reference images not displayed]

FINDINGS: Diffuse soft tissue prominence of the neck noted. This may be
related to the patient's body habitus. Clinical correlation
suggested. If further evaluation is needed IV contrast-enhanced neck
CT can be obtained. Epiglottis, retropharyngeal space appear normal.
Cervical airway an upper lungs appear normal. Diffuse degenerative
change. No acute bony abnormality .
IMPRESSION: Soft tissue prominence noted about the neck diffusely. This may be
related to the patient's body habitus. Clinical correlation
suggested. If further evaluation of the soft tissues of the neck is
needed IV contrast-enhanced neck CT can be obtained. No acute or
focal abnormality identified. Diffuse degenerative changes cervical
spine.

## 2017-05-16 ENCOUNTER — Other Ambulatory Visit: Payer: Self-pay | Admitting: Adult Health

## 2017-05-20 DIAGNOSIS — R69 Illness, unspecified: Secondary | ICD-10-CM | POA: Diagnosis not present

## 2017-05-20 NOTE — Telephone Encounter (Signed)
ok to fill for 30 days.  He is due for his CPE in March

## 2017-05-20 NOTE — Telephone Encounter (Signed)
Now due for cpx and fasting lab work.

## 2017-05-21 NOTE — Telephone Encounter (Signed)
Sent to the pharmacy by e-scribe for 30 days.  Pt now scheduled for cpx with fasting lab work on 06/19/17 @ 9 AM.

## 2017-06-13 ENCOUNTER — Encounter: Payer: Self-pay | Admitting: Neurology

## 2017-06-13 ENCOUNTER — Ambulatory Visit (INDEPENDENT_AMBULATORY_CARE_PROVIDER_SITE_OTHER): Payer: Medicare HMO | Admitting: Neurology

## 2017-06-13 ENCOUNTER — Other Ambulatory Visit: Payer: Self-pay

## 2017-06-13 ENCOUNTER — Ambulatory Visit: Payer: Self-pay | Admitting: Neurology

## 2017-06-13 VITALS — BP 126/62 | HR 93 | Ht 78.0 in | Wt 220.0 lb

## 2017-06-13 DIAGNOSIS — G40009 Localization-related (focal) (partial) idiopathic epilepsy and epileptic syndromes with seizures of localized onset, not intractable, without status epilepticus: Secondary | ICD-10-CM | POA: Diagnosis not present

## 2017-06-13 MED ORDER — DIVALPROEX SODIUM ER 500 MG PO TB24: 1000.0000 mg | ORAL_TABLET | Freq: Every day | ORAL | 11 refills | Status: DC
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
## 2017-06-19 ENCOUNTER — Ambulatory Visit (INDEPENDENT_AMBULATORY_CARE_PROVIDER_SITE_OTHER): Payer: Medicare HMO | Admitting: Adult Health

## 2017-06-19 ENCOUNTER — Encounter: Payer: Self-pay | Admitting: Adult Health

## 2017-06-19 VITALS — BP 112/80 | Temp 98.1°F | Ht 77.0 in | Wt 215.0 lb

## 2017-06-19 DIAGNOSIS — F17200 Nicotine dependence, unspecified, uncomplicated: Secondary | ICD-10-CM

## 2017-06-19 DIAGNOSIS — R69 Illness, unspecified: Secondary | ICD-10-CM | POA: Diagnosis not present

## 2017-06-19 DIAGNOSIS — F200 Paranoid schizophrenia: Secondary | ICD-10-CM | POA: Insufficient documentation

## 2017-06-19 DIAGNOSIS — F209 Schizophrenia, unspecified: Secondary | ICD-10-CM

## 2017-06-19 DIAGNOSIS — Z0000 Encounter for general adult medical examination without abnormal findings: Secondary | ICD-10-CM

## 2017-06-19 LAB — URINALYSIS, ROUTINE W REFLEX MICROSCOPIC
Bilirubin Urine: NEGATIVE
HGB URINE DIPSTICK: NEGATIVE
Ketones, ur: NEGATIVE
Leukocytes, UA: NEGATIVE
NITRITE: NEGATIVE
RBC / HPF: NONE SEEN (ref 0–?)
Specific Gravity, Urine: 1.015 (ref 1.000–1.030)
Total Protein, Urine: NEGATIVE
Urine Glucose: NEGATIVE
Urobilinogen, UA: 0.2 (ref 0.0–1.0)
pH: 7.5 (ref 5.0–8.0)

## 2017-06-19 LAB — CBC WITH DIFFERENTIAL/PLATELET
BASOS PCT: 0.3 % (ref 0.0–3.0)
Basophils Absolute: 0 10*3/uL (ref 0.0–0.1)
EOS PCT: 1 % (ref 0.0–5.0)
Eosinophils Absolute: 0.1 10*3/uL (ref 0.0–0.7)
HCT: 42.1 % (ref 39.0–52.0)
HEMOGLOBIN: 14.1 g/dL (ref 13.0–17.0)
Lymphocytes Relative: 43.8 % (ref 12.0–46.0)
Lymphs Abs: 2.4 10*3/uL (ref 0.7–4.0)
MCHC: 33.5 g/dL (ref 30.0–36.0)
MCV: 87.1 fl (ref 78.0–100.0)
MONO ABS: 0.5 10*3/uL (ref 0.1–1.0)
Monocytes Relative: 8.4 % (ref 3.0–12.0)
NEUTROS ABS: 2.6 10*3/uL (ref 1.4–7.7)
Neutrophils Relative %: 46.5 % (ref 43.0–77.0)
PLATELETS: 251 10*3/uL (ref 150.0–400.0)
RBC: 4.83 Mil/uL (ref 4.22–5.81)
RDW: 13.6 % (ref 11.5–15.5)
WBC: 5.5 10*3/uL (ref 4.0–10.5)

## 2017-06-19 LAB — COMPREHENSIVE METABOLIC PANEL
ALBUMIN: 4.6 g/dL (ref 3.5–5.2)
ALT: 10 U/L (ref 0–53)
AST: 19 U/L (ref 0–37)
Alkaline Phosphatase: 60 U/L (ref 39–117)
BUN: 6 mg/dL (ref 6–23)
CALCIUM: 9.8 mg/dL (ref 8.4–10.5)
CHLORIDE: 105 meq/L (ref 96–112)
CO2: 26 mEq/L (ref 19–32)
Creatinine, Ser: 1.13 mg/dL (ref 0.40–1.50)
GFR: 91.06 mL/min (ref >60.00)
Glucose, Bld: 102 mg/dL — ABNORMAL HIGH (ref 70–99)
POTASSIUM: 4.8 meq/L (ref 3.5–5.1)
SODIUM: 141 meq/L (ref 135–145)
Total Bilirubin: 0.4 mg/dL (ref 0.2–1.2)
Total Protein: 7 g/dL (ref 6.0–8.3)

## 2017-06-19 LAB — TSH: TSH: 0.81 u[IU]/mL (ref 0.35–4.50)

## 2017-06-19 LAB — HEMOGLOBIN A1C: Hgb A1c MFr Bld: 5.9 % (ref 4.6–6.5)

## 2017-06-19 LAB — LIPID PANEL
CHOLESTEROL: 185 mg/dL (ref 0–200)
HDL: 48.4 mg/dL (ref >39.00)
NonHDL: 136.87
Total CHOL/HDL Ratio: 4
Triglycerides: 330 mg/dL — ABNORMAL HIGH (ref 0.0–149.0)
VLDL: 66 mg/dL — AB (ref 0.0–40.0)

## 2017-06-19 LAB — LDL CHOLESTEROL, DIRECT: Direct LDL: 90 mg/dL

## 2017-06-19 NOTE — Progress Notes (Signed)
Subjective:    Patient ID: Alfred [name], male    DOB: 11-16-1975, 42 y.o.   MRN: [id number]  HPI  Patient presents for yearly preventative medicine examination. He is a pleasant 42 year old male who  has a past medical history of Alcohol abuse, Depression, Hypertension, Migraines, Schizophrenia (HCC), and Seizure (HCC). His mom is present during this his visit  He is prescribed Depakote for seizures, schizophrenia and migraines  He is prescribed Zyprexa for schizophrenia  He is prescribed Zantac for GERD-like symptoms  He is seen by psychiatry and neurology. Reports no recent convulsion or hallucinations. His mom is helping him with he meds and he has been taking them more regularly. He feels as though he is improving ( his mom agrees)  just more fatigued since Risperdal was changed to Zyprexa.   All immunizations and health maintenance protocols were reviewed with the patient and needed orders were placed.  He reports that his appetite is good and he is sleeping well.   Appropriate screening laboratory values were ordered for the patient including screening of hyperlipidemia, renal function and hepatic function. If indicated by BPH, a PSA was ordered.  Medication reconciliation,  past medical history, social history, problem list and allergies were reviewed in detail with the patient  Goals were established with regard to weight loss, exercise, and  diet in compliance with medications  Unfortunately, he continues to smoke. He is no longer using the nicotine patch   He has no acute complaints.   Review of Systems  Constitutional: Negative.   HENT: Negative.   Eyes: Negative.   Respiratory: Negative.   Cardiovascular: Negative.   Gastrointestinal: Negative.   Endocrine: Negative.   Genitourinary: Negative.   Musculoskeletal: Negative.   Skin: Negative.   Allergic/Immunologic: Negative.   Neurological: Negative.   Hematological: Negative.   Psychiatric/Behavioral:  Positive for decreased concentration.  All other systems reviewed and are negative.  Past Medical History:  Diagnosis Date  . Alcohol abuse   . Depression   . Hypertension   . Migraines   . Schizophrenia (HCC)   . Seizure (HCC)    alcoho    Social History   Socioeconomic History  . Marital status: Single    Spouse name: Not on file  . Number of children: 3  . Years of education: Not on file  . Highest education level: Not on file  Occupational History  . Occupation: Unemployed  Social Needs  . Financial resource strain: Not on file  . Food insecurity:    Worry: Not on file    Inability: Not on file  . Transportation needs:    Medical: Not on file    Non-medical: Not on file  Tobacco Use  . Smoking status: Current Some Day Smoker    Packs/day: 1.00    Types: Cigarettes  . Smokeless tobacco: Never Used  Substance and Sexual Activity  . Alcohol use: Yes  . Drug use: Yes    Types: Marijuana  . Sexual activity: Not on file  Lifestyle  . Physical activity:    Days per week: Not on file    Minutes per session: Not on file  . Stress: Not on file  Relationships  . Social connections:    Talks on phone: Not on file    Gets together: Not on file    Attends religious service: Not on file    Active member of club or organization: Not on file    Attends meetings of  clubs or organizations: Not on file    Relationship status: Not on file  . Intimate partner violence:    Fear of current or ex partner: Not on file    Emotionally abused: Not on file    Physically abused: Not on file    Forced sexual activity: Not on file  Other Topics Concern  . Not on file  Social History Narrative  . Not on file    Past Surgical History:  Procedure Laterality Date  . WISDOM TOOTH EXTRACTION      Family History  Problem Relation Age of Onset  . Diabetes Mother   . Hypertension Mother   . Hyperlipidemia Mother   . Anemia Mother   . Heart murmur Mother   . Colon polyps  Maternal Uncle   . Heart disease Maternal Uncle   . Celiac disease Maternal Grandmother   . Diabetes Maternal Grandmother   . Diabetes Maternal Grandfather     No Known Allergies  Current Outpatient Medications on File Prior to Visit  Medication Sig Dispense Refill  . acetaminophen (TYLENOL) 325 MG tablet Take 2 tablets (650 mg total) by mouth every 6 (six) hours as needed for mild pain, fever or headache. 40 tablet 0  . divalproex (DEPAKOTE ER) 500 MG 24 hr tablet Take 2 tablets (1,000 mg total) by mouth at bedtime. 60 tablet 11  . Multiple Vitamin (MULTIVITAMIN WITH MINERALS) TABS tablet Take 1 tablet by mouth daily. 30 tablet 3  . nicotine (NICODERM CQ - DOSED IN MG/24 HOURS) 21 mg/24hr patch Place 1 patch (21 mg total) onto the skin daily. 28 patch 0  . OLANZapine (ZYPREXA) 10 MG tablet Take 10 mg by mouth 2 (two) times daily.    Marland Kitchen omeprazole (PRILOSEC) 40 MG capsule Take 1 capsule (40 mg total) by mouth daily. 30 capsule 2  . polyethylene glycol (MIRALAX / GLYCOLAX) packet Take 17 g by mouth daily as needed for moderate constipation. 28 each 0  . ranitidine (ZANTAC) 150 MG tablet TAKE 1 TABLET BY MOUTH AT BEDTIME 30 tablet 0   No current facility-administered medications on file prior to visit.     BP 112/80 (BP Location: Left Arm)   Temp 98.1 F (36.7 C) (Oral)   Ht 6\' 5"  (1.956 m)   Wt 215 lb (97.5 kg)   BMI 25.50 kg/m       Objective:   Physical Exam  Constitutional: He is oriented to person, place, and time. He appears well-developed and well-nourished. No distress.  HENT:  Head: Normocephalic and atraumatic.  Right Ear: External ear normal.  Left Ear: External ear normal.  Nose: Nose normal.  Mouth/Throat: Oropharynx is clear and moist. No oropharyngeal exudate.  Eyes: Pupils are equal, round, and reactive to light. Conjunctivae and EOM are normal. Right eye exhibits no discharge. Left eye exhibits no discharge. No scleral icterus.  Neck: Normal range of motion.  Neck supple. No JVD present. No tracheal deviation present. No thyromegaly present.  Cardiovascular: Normal rate, regular rhythm, normal heart sounds and intact distal pulses. Exam reveals no gallop and no friction rub.  No murmur heard. Pulmonary/Chest: Effort normal and breath sounds normal. No stridor. No respiratory distress. He has no wheezes. He has no rales. He exhibits no tenderness.  Abdominal: Soft. Bowel sounds are normal. He exhibits no distension and no mass. There is no tenderness. There is no rebound and no guarding.  Musculoskeletal: Normal range of motion. He exhibits no edema, tenderness or deformity.  Lymphadenopathy:  He has no cervical adenopathy.  Neurological: He is alert and oriented to person, place, and time. He has normal reflexes. He displays normal reflexes. No cranial nerve deficit. He exhibits normal muscle tone. Coordination normal.  Skin: Skin is warm and dry. No rash noted. He is not diaphoretic. No erythema. No pallor.  Psychiatric: He has a normal mood and affect. Judgment and thought content normal. He is slowed and withdrawn. Cognition and memory are normal.  Nursing note and vitals reviewed.     Assessment & Plan:  1. Routine general medical examination at a health care facility - I would like him to quit smoking  - His diet is poor and he does not exercise. Encouraged to do this.  - Follow up in one year or sooner if needed - CBC with Differential/Platelet - Comprehensive metabolic panel - Hemoglobin A1c - Lipid panel - TSH - Urinalysis, Routine w reflex microscopic  2. Schizophrenia, unspecified type (HCC) - Follow up with psych as directed   3. TOBACCO DEPENDENCE - Advised OTC 21 mg Nicotine patch   Shirline Frees, NP

## 2017-06-21 DIAGNOSIS — G40009 Localization-related (focal) (partial) idiopathic epilepsy and epileptic syndromes with seizures of localized onset, not intractable, without status epilepticus: Secondary | ICD-10-CM | POA: Insufficient documentation

## 2017-06-24 DIAGNOSIS — Z791 Long term (current) use of non-steroidal anti-inflammatories (NSAID): Secondary | ICD-10-CM | POA: Diagnosis not present

## 2017-06-24 DIAGNOSIS — K219 Gastro-esophageal reflux disease without esophagitis: Secondary | ICD-10-CM | POA: Diagnosis not present

## 2017-06-24 DIAGNOSIS — Z809 Family history of malignant neoplasm, unspecified: Secondary | ICD-10-CM | POA: Diagnosis not present

## 2017-06-24 DIAGNOSIS — G8929 Other chronic pain: Secondary | ICD-10-CM | POA: Diagnosis not present

## 2017-06-24 DIAGNOSIS — R030 Elevated blood-pressure reading, without diagnosis of hypertension: Secondary | ICD-10-CM | POA: Diagnosis not present

## 2017-06-24 DIAGNOSIS — R569 Unspecified convulsions: Secondary | ICD-10-CM | POA: Diagnosis not present

## 2017-06-24 DIAGNOSIS — R69 Illness, unspecified: Secondary | ICD-10-CM | POA: Diagnosis not present

## 2017-06-24 DIAGNOSIS — Z823 Family history of stroke: Secondary | ICD-10-CM | POA: Diagnosis not present

## 2017-07-03 ENCOUNTER — Other Ambulatory Visit: Payer: Self-pay | Admitting: Adult Health

## 2017-07-03 DIAGNOSIS — K2100 Gastro-esophageal reflux disease with esophagitis, without bleeding: Secondary | ICD-10-CM

## 2017-07-03 NOTE — Telephone Encounter (Signed)
Sent to the pharmacy by e-scribe. 

## 2017-07-11 ENCOUNTER — Other Ambulatory Visit: Payer: Self-pay | Admitting: Adult Health

## 2017-07-11 NOTE — Telephone Encounter (Signed)
Sent to the pharmacy by e-scribe. 

## 2017-09-15 DIAGNOSIS — R69 Illness, unspecified: Secondary | ICD-10-CM | POA: Diagnosis not present

## 2017-09-24 ENCOUNTER — Telehealth: Payer: Self-pay | Admitting: Adult Health

## 2017-09-24 NOTE — Telephone Encounter (Signed)
Mother is concerned due to patient talking funny and complaining about being so tired.  She is wanting to get blood levels checked that were so high before when hospitalized.  Mother is requesting a call back.

## 2017-09-24 NOTE — Telephone Encounter (Signed)
Pt needs appt.  Unable to schedule due to phone lines down.  Will call when able.

## 2017-09-25 ENCOUNTER — Ambulatory Visit: Payer: Self-pay | Admitting: Adult Health

## 2017-09-25 NOTE — Telephone Encounter (Signed)
Spoke w/ patient's mother. Patient had stopped taking his olanzapine due to an insurance coverage issue and restarted yesterday and is now back to baseline as far as mental status and energy level. Mother also remember that there was something he was supposed to have retested after his last labs. We arranged 6 month follow-up appointment for triglyceride recheck. Nothing further needed at this time.

## 2017-09-25 NOTE — Telephone Encounter (Signed)
Attempted to call both numbers listed and left messages to call back to be triaged.

## 2017-09-25 NOTE — Telephone Encounter (Signed)
PEC NT left message for mother to return call as phones are still down

## 2017-09-25 NOTE — Telephone Encounter (Signed)
Spoke w/ patient's mother. Patient had stopped taking his olanzapine due to an insurance coverage issue and restarted yesterday and is now back to baseline as far as mental status and energy level. Mother also remember that there was something he was supposed to have retested after his last labs. We arranged 6 month follow-up appointment for triglyceride recheck. Nothing further needed at this time. 

## 2017-09-25 NOTE — Telephone Encounter (Signed)
Pt. Returned call about scheduling an appointment. States his mother will have to schedule his appointment because she is his transportation.

## 2017-12-10 ENCOUNTER — Telehealth: Payer: Self-pay | Admitting: Adult Health

## 2017-12-10 ENCOUNTER — Emergency Department (HOSPITAL_COMMUNITY)
Admission: EM | Admit: 2017-12-10 | Discharge: 2017-12-11 | Discharge status: Against Medical Advice | Payer: Medicare HMO | Attending: Emergency Medicine | Admitting: Emergency Medicine

## 2017-12-10 ENCOUNTER — Encounter (HOSPITAL_COMMUNITY): Payer: Self-pay | Admitting: *Deleted

## 2017-12-10 ENCOUNTER — Other Ambulatory Visit: Payer: Self-pay

## 2017-12-10 DIAGNOSIS — F17210 Nicotine dependence, cigarettes, uncomplicated: Secondary | ICD-10-CM | POA: Insufficient documentation

## 2017-12-10 DIAGNOSIS — Z79899 Other long term (current) drug therapy: Secondary | ICD-10-CM | POA: Insufficient documentation

## 2017-12-10 DIAGNOSIS — I10 Essential (primary) hypertension: Secondary | ICD-10-CM | POA: Diagnosis not present

## 2017-12-10 DIAGNOSIS — R69 Illness, unspecified: Secondary | ICD-10-CM | POA: Diagnosis not present

## 2017-12-10 DIAGNOSIS — M6282 Rhabdomyolysis: Secondary | ICD-10-CM | POA: Insufficient documentation

## 2017-12-10 DIAGNOSIS — M79604 Pain in right leg: Secondary | ICD-10-CM | POA: Diagnosis present

## 2017-12-10 LAB — COMPREHENSIVE METABOLIC PANEL
ALK PHOS: 59 U/L (ref 38–126)
ALT: 28 U/L (ref 0–44)
AST: 89 U/L — ABNORMAL HIGH (ref 15–41)
Albumin: 4.2 g/dL (ref 3.5–5.0)
Anion gap: 12 (ref 5–15)
BILIRUBIN TOTAL: 1.1 mg/dL (ref 0.3–1.2)
BUN: 12 mg/dL (ref 6–20)
CALCIUM: 9.4 mg/dL (ref 8.9–10.3)
CO2: 25 mmol/L (ref 22–32)
CREATININE: 1.25 mg/dL — AB (ref 0.61–1.24)
Chloride: 97 mmol/L — ABNORMAL LOW (ref 98–111)
GFR calc non Af Amer: >60 mL/min (ref >60)
Glucose, Bld: 130 mg/dL — ABNORMAL HIGH (ref 70–99)
Potassium: 3.5 mmol/L (ref 3.5–5.1)
SODIUM: 134 mmol/L — AB (ref 135–145)
Total Protein: 8 g/dL (ref 6.5–8.1)

## 2017-12-10 LAB — CBC
HCT: 44.1 % (ref 39.0–52.0)
Hemoglobin: 14.6 g/dL (ref 13.0–17.0)
MCH: 29.5 pg (ref 26.0–34.0)
MCHC: 33.1 g/dL (ref 30.0–36.0)
MCV: 89.1 fL (ref 78.0–100.0)
PLATELETS: 206 10*3/uL (ref 150–400)
RBC: 4.95 MIL/uL (ref 4.22–5.81)
RDW: 12.6 % (ref 11.5–15.5)
WBC: 10.5 10*3/uL (ref 4.0–10.5)

## 2017-12-10 LAB — ETHANOL: Alcohol, Ethyl (B): <10 mg/dL (ref <10)

## 2017-12-10 MED ORDER — ACETAMINOPHEN 325 MG PO TABS
650.0000 mg | ORAL_TABLET | Freq: Once | ORAL | Status: AC
  Administered 2017-12-10: 650 mg via ORAL
  Filled 2017-12-10: qty 2

## 2017-12-10 MED ORDER — SODIUM CHLORIDE 0.9 % IV BOLUS
1000.0000 mL | Freq: Once | INTRAVENOUS | Status: AC
  Administered 2017-12-11: 1000 mL via INTRAVENOUS

## 2017-12-10 NOTE — ED Triage Notes (Addendum)
Pt has multiple complaints. Patient reports feeling dehydrated and having leg pain. Pt has psychiatric history, family states he is behaving as he did in past which was related to his medications. Family states pt is paranoid and keeps "running away" from them and has twitching to face and upper body. Pt is calm and cooperative at triage. Pt reports having "scattered thoughts" but denies SI or HI.

## 2017-12-10 NOTE — Telephone Encounter (Signed)
Pt has been rsc to 12-11-17

## 2017-12-10 NOTE — Telephone Encounter (Signed)
We can put him on the schedule tomorrow
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
## 2017-12-11 ENCOUNTER — Encounter: Payer: Self-pay | Admitting: Adult Health

## 2017-12-11 ENCOUNTER — Ambulatory Visit (INDEPENDENT_AMBULATORY_CARE_PROVIDER_SITE_OTHER): Payer: Medicare HMO | Admitting: Adult Health

## 2017-12-11 ENCOUNTER — Encounter

## 2017-12-11 VITALS — BP 126/86 | Temp 98.6°F | Wt 198.0 lb

## 2017-12-11 DIAGNOSIS — F23 Brief psychotic disorder: Secondary | ICD-10-CM

## 2017-12-11 DIAGNOSIS — Z113 Encounter for screening for infections with a predominantly sexual mode of transmission: Secondary | ICD-10-CM

## 2017-12-11 DIAGNOSIS — R69 Illness, unspecified: Secondary | ICD-10-CM | POA: Diagnosis not present

## 2017-12-11 DIAGNOSIS — M6282 Rhabdomyolysis: Secondary | ICD-10-CM

## 2017-12-11 DIAGNOSIS — Z79899 Other long term (current) drug therapy: Secondary | ICD-10-CM | POA: Diagnosis not present

## 2017-12-11 DIAGNOSIS — F209 Schizophrenia, unspecified: Secondary | ICD-10-CM | POA: Diagnosis not present

## 2017-12-11 DIAGNOSIS — I10 Essential (primary) hypertension: Secondary | ICD-10-CM | POA: Diagnosis not present

## 2017-12-11 LAB — URINALYSIS, ROUTINE W REFLEX MICROSCOPIC
Bacteria, UA: NONE SEEN
Bilirubin Urine: NEGATIVE
Glucose, UA: NEGATIVE mg/dL
KETONES UR: NEGATIVE mg/dL
LEUKOCYTES UA: NEGATIVE
Nitrite: NEGATIVE
PH: 6 (ref 5.0–8.0)
Protein, ur: NEGATIVE mg/dL
SPECIFIC GRAVITY, URINE: 1.003 — AB (ref 1.005–1.030)

## 2017-12-11 LAB — CK
Total CK: 2398 U/L — ABNORMAL HIGH (ref 49–397)
Total CK: 2202 U/L — ABNORMAL HIGH (ref 49–397)

## 2017-12-11 MED ORDER — OLANZAPINE 10 MG PO TABS: 10.0000 mg | ORAL_TABLET | Freq: Two times a day (BID) | ORAL | 1 refills | Status: DC

## 2017-12-11 NOTE — ED Notes (Signed)
Pt feeling very paranoid. He states he feels like "something is not right, like not that something is wrong with me, but that everything around me just is'nt right."

## 2017-12-11 NOTE — ED Provider Notes (Cosign Needed)
[facility] EMERGENCY DEPARTMENT Provider Note   CSN: [id number] Arrival date & time: 12/10/17  1704     History   Chief Complaint Chief Complaint  Patient presents with  . Anxiety  . Leg Pain    HPI Alfred [name] is a 42 y.o. male.  HPI   Patient is a 42 year old male with history of schizophrenia, seizures, migraine, hypertension, depression, alcohol abuse who presents emergency department today for evaluation of bilateral lower leg pain that began several days ago.  Patient states that he has been "running until he cannot run anymore.  States that since then he started to have leg pain because of this.  He states that he tried to hydrate by himself at home however this did not improve his symptoms.  Patient endorses auditory hallucinations.  Denies command hallucinations.  Denies visual hallucinations.  No SI or HI.  States he has been mostly compliant with his psychiatric medications however is probably missed about 2 doses this month.  Patient endorses marijuana use.  He states that he also drank a beer today.  States he did not drink every day and does not use any other illicit drugs.  Family at bedside assists with history and states that patient's hallucinations have been increasing and he seems to be twitching more.  Records reviewed, patient has had history of rhabdomyolysis and psychosis.  Past Medical History:  Diagnosis Date  . Alcohol abuse   . Depression   . Hypertension   . Migraines   . Schizophrenia (HCC)   . Seizure (HCC)    alcoho    Patient Active Problem List   Diagnosis Date Noted  . Localization-related idiopathic epilepsy and epileptic syndromes with seizures of localized onset, not intractable, without status epilepticus (HCC) 06/21/2017  . Schizophrenia (HCC) 06/19/2017  . Gastroesophageal reflux disease with esophagitis   . Cocaine abuse w/cocaine-induced psychotic disorder w/hallucinations (HCC) 12/07/2016  . Cannabis  abuse with psychotic disorder (HCC) 12/07/2016  . Pericarditis 12/06/2016  . Rhabdomyolysis 12/06/2016  . EtOH dependence (HCC) 12/06/2016  . Transient alteration of awareness 08/28/2016  . Jerky body movements 08/28/2016  . Seizures (HCC) 06/18/2016  . Internal hemorrhoids 09/30/2013  . Acute psychosis (HCC) 05/29/2006  . TOBACCO DEPENDENCE 05/29/2006    Past Surgical History:  Procedure Laterality Date  . WISDOM TOOTH EXTRACTION          Home Medications    Prior to Admission medications   Medication Sig Start Date End Date Taking? Authorizing Provider  acetaminophen (TYLENOL) 325 MG tablet Take 2 tablets (650 mg total) by mouth every 6 (six) hours as needed for mild pain, fever or headache. 12/08/16   Vassie LollMadera, Carlos, MD  divalproex (DEPAKOTE ER) 500 MG 24 hr tablet Take 2 tablets (1,000 mg total) by mouth at bedtime. 06/13/17   Van ClinesAquino, Karen M, MD  Multiple Vitamin (MULTIVITAMIN WITH MINERALS) TABS tablet Take 1 tablet by mouth daily. 12/09/16   Vassie LollMadera, Carlos, MD  nicotine (NICODERM CQ - DOSED IN MG/24 HOURS) 21 mg/24hr patch Place 1 patch (21 mg total) onto the skin daily. 12/09/16   Vassie LollMadera, Carlos, MD  OLANZapine (ZYPREXA) 10 MG tablet Take 1 tablet (10 mg total) by mouth 2 (two) times daily. 12/11/17 01/10/18  Nafziger, Kandee Keenory, NP  omeprazole (PRILOSEC) 40 MG capsule Take 1 capsule (40 mg total) by mouth daily. 12/08/16   Vassie LollMadera, Carlos, MD  omeprazole (PRILOSEC) 40 MG capsule TAKE 1 CAPSULE BY MOUTH EVERY DAY 07/03/17   Nafziger, Hardingory,  NP  polyethylene glycol (MIRALAX / GLYCOLAX) packet Take 17 g by mouth daily as needed for moderate constipation. 12/08/16   Vassie Loll, MD  ranitidine (ZANTAC) 150 MG tablet TAKE 1 TABLET BY MOUTH AT BEDTIME 07/11/17   Shirline Frees, NP    Family History Family History  Problem Relation Age of Onset  . Diabetes Mother   . Hypertension Mother   . Hyperlipidemia Mother   . Anemia Mother   . Heart murmur Mother   . Colon polyps Maternal Uncle     . Heart disease Maternal Uncle   . Celiac disease Maternal Grandmother   . Diabetes Maternal Grandmother   . Diabetes Maternal Grandfather     Social History Social History   Tobacco Use  . Smoking status: Current Some Day Smoker    Packs/day: 1.00    Types: Cigarettes  . Smokeless tobacco: Never Used  Substance Use Topics  . Alcohol use: Yes  . Drug use: Yes    Types: Marijuana     Allergies   Patient has no known allergies.   Review of Systems Review of Systems  Constitutional: Negative for chills and fever.  HENT: Negative for ear pain and sore throat.   Eyes: Negative for pain and visual disturbance.  Respiratory: Negative for shortness of breath.   Cardiovascular: Negative for chest pain.  Gastrointestinal: Negative for abdominal pain, nausea and vomiting.  Genitourinary: Negative for dysuria and hematuria.  Musculoskeletal:       BUE and BLE pain  Skin: Negative for rash.  Neurological: Negative for dizziness, seizures, weakness, light-headedness, numbness and headaches.  Psychiatric/Behavioral: Positive for hallucinations.       No si or hi  All other systems reviewed and are negative.    Physical Exam Updated Vital Signs BP 113/72   Pulse 95   Temp 98.4 F (36.9 C) (Oral)   Resp 18   SpO2 97%   Physical Exam  Constitutional: He appears well-developed and well-nourished.  HENT:  Head: Normocephalic and atraumatic.  Eyes: Conjunctivae are normal.  Neck: Neck supple.  Cardiovascular: Normal rate and regular rhythm.  No murmur heard. Pulmonary/Chest: Effort normal and breath sounds normal. No respiratory distress.  Abdominal: Soft. There is no tenderness.  Musculoskeletal:  Diffuse tenderness to palpation to the bilateral upper and lower extremity's.  No edema noted.  All tremors are warm and well-perfused.  Neurological: He is alert.  Skin: Skin is warm and dry.  Psychiatric:  Patient appears to be responding to internal stimuli during exam.   Endorsing auditory hallucinations.  Is having paranoid delusions.  Judgment abnormal and thought content are abnormal.  Nursing note and vitals reviewed.    ED Treatments / Results  Labs (all labs ordered are listed, but only abnormal results are displayed) Labs Reviewed  COMPREHENSIVE METABOLIC PANEL - Abnormal; Notable for the following components:      Result Value   Sodium 134 (*)    Chloride 97 (*)    Glucose, Bld 130 (*)    Creatinine, Ser 1.25 (*)    AST 89 (*)    All other components within normal limits  URINALYSIS, ROUTINE W REFLEX MICROSCOPIC - Abnormal; Notable for the following components:   Color, Urine STRAW (*)    Specific Gravity, Urine 1.003 (*)    Hgb urine dipstick SMALL (*)    All other components within normal limits  CK - Abnormal; Notable for the following components:   Total CK 2,398 (*)    All  other components within normal limits  CK - Abnormal; Notable for the following components:   Total CK 2,202 (*)    All other components within normal limits  ETHANOL  CBC    EKG None  Radiology No results found.  Procedures Procedures (including critical care time)  Medications Ordered in ED Medications  acetaminophen (TYLENOL) tablet 650 mg (650 mg Oral Given 12/10/17 2110)  sodium chloride 0.9 % bolus 1,000 mL (0 mLs Intravenous Stopped 12/11/17 0800)     Initial Impression / Assessment and Plan / ED Course  I have reviewed the triage vital signs and the nursing notes.  Pertinent labs & imaging results that were available during my care of the patient were reviewed by me and considered in my medical decision making (see chart for details).    Discussed pt presentation and exam findings with Dr. Blinda Leatherwood who will assume care of the patient at the time of shift change.   Final Clinical Impressions(s) / ED Diagnoses   Final diagnoses:  Non-traumatic rhabdomyolysis   42 year old male with a history of schizophrenia who presents the emergency  department today for evaluation of bilateral lower leg pain.  Patient states that he has been running "until I can't anymore" for the last week.  He has since developed this pain.  He has a history of rhabdomyolysis as well as psychosis.  Vital stable today.  Initial lab work with no leukocytosis or anemia.  CMP with mild electrolyte derangement, mildly elevated creatinine 1.25, mildly elevated AST but otherwise within normal limits.  EtOH negative.  Given his history CK was obtained which was elevated at about 2400.   1 L fluid bolus ordered and will plan to continue IV hydrations and repeat CK.  Patient care was signed out to Dr. Blinda Leatherwood at shift change with plan to follow-up on CK level after administration of IV fluids.  Will also determine whether or not patient needs psych consult at this time given his endorsement of auditory hallucinations.  He currently denies SI or HI.  ED Discharge Orders    None       Karrie Meres, [place] 12/11/17 1628

## 2017-12-11 NOTE — Progress Notes (Signed)
Subjective:    Patient ID: Alfred [name], male    DOB: 06-Jun-1975, 42 y.o.   MRN: [id number]  HPI  42 year old male who  has a past medical history of Alcohol abuse, Depression, Hypertension, Migraines, Schizophrenia (HCC), and Seizure (HCC). He presents to the office today with his mother for an acute psychotic episode.  Reports that the patient's hallucinations have been increasing over the last few weeks and that she had to file a missing person report on him last night after he took off from the house.  He was found and taken to the emergency room with the complaint of bilateral lower extremity pain.  He states that "I was running until I could not run anymore".  In the emergency room his initial CK was 2398, and repeat was 2202.  He was given 2 bags of IV fluids in the emergency room and then he left without notifying anybody.   In the office he reports auditory and visual hallucinations.  He states "I have not been sleeping because of do not feel safe and I think people are going to hurt me".  He reports hearing people say his name, feels though something is crawling under his skin, and is seeing things such as snakes on the ground and bullets flying hrough the air.  He reports that his been taking his Depakote as directed but that he has not been taking Zyprexa, he has no idea when he stopped taking this medication.  He has an appointment with his psychiatrist on September 23.  He does endorse smoking marijuana but no other drug use.  There is a drug panel pending from last night's emergency room visit.   Review of Systems See HPI   Past Medical History:  Diagnosis Date  . Alcohol abuse   . Depression   . Hypertension   . Migraines   . Schizophrenia (HCC)   . Seizure (HCC)    alcoho    Social History   Socioeconomic History  . Marital status: Single    Spouse name: Not on file  . Number of children: 3  . Years of education: Not on file  . Highest education level: Not  on file  Occupational History  . Occupation: Unemployed  Social Needs  . Financial resource strain: Not on file  . Food insecurity:    Worry: Not on file    Inability: Not on file  . Transportation needs:    Medical: Not on file    Non-medical: Not on file  Tobacco Use  . Smoking status: Current Some Day Smoker    Packs/day: 1.00    Types: Cigarettes  . Smokeless tobacco: Never Used  Substance and Sexual Activity  . Alcohol use: Yes  . Drug use: Yes    Types: Marijuana  . Sexual activity: Not on file  Lifestyle  . Physical activity:    Days per week: Not on file    Minutes per session: Not on file  . Stress: Not on file  Relationships  . Social connections:    Talks on phone: Not on file    Gets together: Not on file    Attends religious service: Not on file    Active member of club or organization: Not on file    Attends meetings of clubs or organizations: Not on file    Relationship status: Not on file  . Intimate partner violence:    Fear of current or ex partner: Not on file  Emotionally abused: Not on file    Physically abused: Not on file    Forced sexual activity: Not on file  Other Topics Concern  . Not on file  Social History Narrative  . Not on file    Past Surgical History:  Procedure Laterality Date  . WISDOM TOOTH EXTRACTION      Family History  Problem Relation Age of Onset  . Diabetes Mother   . Hypertension Mother   . Hyperlipidemia Mother   . Anemia Mother   . Heart murmur Mother   . Colon polyps Maternal Uncle   . Heart disease Maternal Uncle   . Celiac disease Maternal Grandmother   . Diabetes Maternal Grandmother   . Diabetes Maternal Grandfather     No Known Allergies  Current Outpatient Medications on File Prior to Visit  Medication Sig Dispense Refill  . acetaminophen (TYLENOL) 325 MG tablet Take 2 tablets (650 mg total) by mouth every 6 (six) hours as needed for mild pain, fever or headache. 40 tablet 0  . divalproex  (DEPAKOTE ER) 500 MG 24 hr tablet Take 2 tablets (1,000 mg total) by mouth at bedtime. 60 tablet 11  . Multiple Vitamin (MULTIVITAMIN WITH MINERALS) TABS tablet Take 1 tablet by mouth daily. 30 tablet 3  . nicotine (NICODERM CQ - DOSED IN MG/24 HOURS) 21 mg/24hr patch Place 1 patch (21 mg total) onto the skin daily. 28 patch 0  . omeprazole (PRILOSEC) 40 MG capsule Take 1 capsule (40 mg total) by mouth daily. 30 capsule 2  . omeprazole (PRILOSEC) 40 MG capsule TAKE 1 CAPSULE BY MOUTH EVERY DAY 90 capsule 1  . polyethylene glycol (MIRALAX / GLYCOLAX) packet Take 17 g by mouth daily as needed for moderate constipation. 28 each 0  . ranitidine (ZANTAC) 150 MG tablet TAKE 1 TABLET BY MOUTH AT BEDTIME 90 tablet 3   No current facility-administered medications on file prior to visit.     BP 126/86   Temp 98.6 F (37 C) (Oral)   Wt 198 lb (89.8 kg)   BMI 23.48 kg/m        Objective:   Physical Exam  Constitutional: He is oriented to person, place, and time. He appears well-developed and well-nourished. No distress.  Ill kept. Holes in shirt    Eyes: Pupils are equal, round, and reactive to light. Conjunctivae and EOM are normal.  Does not make eye contact  Neck: Normal range of motion. Neck supple.  Cardiovascular: Normal rate, regular rhythm, normal heart sounds and intact distal pulses.  Pulmonary/Chest: Effort normal and breath sounds normal.  Neurological: He is alert and oriented to person, place, and time. He displays tremor.  Skin: Skin is warm and dry. Capillary refill takes less than 2 seconds. He is not diaphoretic.  Psychiatric: His speech is normal and behavior is normal. His mood appears anxious. He is actively hallucinating (visual and auditory ). Thought content is paranoid and delusional. He expresses no homicidal and no suicidal ideation. He expresses no suicidal plans and no homicidal plans. He exhibits abnormal recent memory.  Responding to internal stimuli during exam.   He is having very annoyed delusions and auditory and visual hallucinations  Nursing note and vitals reviewed.     Assessment & Plan:  1. Acute psychosis (HCC) -Restart on Zyprexa dosage that psychiatrist had him on.  Patient is okay with this treatment plan as "I just want the voices to stop".  He is going to follow-up with his psychiatrist at  the end of this month and noted to have him follow-up with me early next week.  His mother was advised to go to the emergency room if symptoms do not improve after starting Zyprexa - OLANZapine (ZYPREXA) 10 MG tablet; Take 1 tablet (10 mg total) by mouth 2 (two) times daily.  Dispense: 60 tablet; Refill: 1  2. Screening examination for STD (sexually transmitted disease) -At patient's request - HIV antibody - RPR  3. Schizophrenia, unspecified type (HCC)  - OLANZapine (ZYPREXA) 10 MG tablet; Take 1 tablet (10 mg total) by mouth 2 (two) times daily.  Dispense: 60 tablet; Refill: 1  4. Non-traumatic rhabdomyolysis - CK, TOTAL(REFL) -Encouraged to force fluids  Shirline Frees, NP

## 2017-12-11 NOTE — ED Notes (Signed)
See downtime chart for ongoing assessment.

## 2017-12-11 NOTE — ED Notes (Signed)
This RN rounded on patient and noticed bed empty and no personal belongings in room. Will make provider aware.

## 2017-12-11 NOTE — Patient Instructions (Signed)
I have sent in a 30 days prescription for Zyprexa.   Follow up with psychiatry   I will follow up with you about your blood work

## 2017-12-12 ENCOUNTER — Encounter (HOSPITAL_COMMUNITY): Payer: Self-pay

## 2017-12-12 ENCOUNTER — Ambulatory Visit: Payer: Self-pay | Admitting: Adult Health

## 2017-12-12 ENCOUNTER — Emergency Department (HOSPITAL_COMMUNITY)
Admission: EM | Admit: 2017-12-12 | Discharge: 2017-12-13 | Disposition: A | Discharge status: Other Acute Inpatient Hospital | Payer: Medicare HMO | Attending: Emergency Medicine | Admitting: Emergency Medicine

## 2017-12-12 ENCOUNTER — Other Ambulatory Visit: Payer: Self-pay

## 2017-12-12 ENCOUNTER — Emergency Department (HOSPITAL_COMMUNITY): Payer: Medicare HMO

## 2017-12-12 DIAGNOSIS — R748 Abnormal levels of other serum enzymes: Secondary | ICD-10-CM

## 2017-12-12 DIAGNOSIS — F200 Paranoid schizophrenia: Secondary | ICD-10-CM | POA: Diagnosis not present

## 2017-12-12 DIAGNOSIS — R45851 Suicidal ideations: Secondary | ICD-10-CM | POA: Diagnosis not present

## 2017-12-12 DIAGNOSIS — R1030 Lower abdominal pain, unspecified: Secondary | ICD-10-CM | POA: Insufficient documentation

## 2017-12-12 DIAGNOSIS — I10 Essential (primary) hypertension: Secondary | ICD-10-CM | POA: Insufficient documentation

## 2017-12-12 DIAGNOSIS — F209 Schizophrenia, unspecified: Secondary | ICD-10-CM

## 2017-12-12 DIAGNOSIS — Z79899 Other long term (current) drug therapy: Secondary | ICD-10-CM | POA: Insufficient documentation

## 2017-12-12 DIAGNOSIS — F29 Unspecified psychosis not due to a substance or known physiological condition: Secondary | ICD-10-CM | POA: Diagnosis present

## 2017-12-12 DIAGNOSIS — F419 Anxiety disorder, unspecified: Secondary | ICD-10-CM | POA: Diagnosis not present

## 2017-12-12 DIAGNOSIS — F1210 Cannabis abuse, uncomplicated: Secondary | ICD-10-CM | POA: Insufficient documentation

## 2017-12-12 DIAGNOSIS — R109 Unspecified abdominal pain: Secondary | ICD-10-CM

## 2017-12-12 DIAGNOSIS — G8929 Other chronic pain: Secondary | ICD-10-CM | POA: Diagnosis not present

## 2017-12-12 DIAGNOSIS — Z560 Unemployment, unspecified: Secondary | ICD-10-CM | POA: Diagnosis not present

## 2017-12-12 DIAGNOSIS — F17210 Nicotine dependence, cigarettes, uncomplicated: Secondary | ICD-10-CM | POA: Insufficient documentation

## 2017-12-12 DIAGNOSIS — R69 Illness, unspecified: Secondary | ICD-10-CM | POA: Diagnosis not present

## 2017-12-12 DIAGNOSIS — F12159 Cannabis abuse with psychotic disorder, unspecified: Secondary | ICD-10-CM | POA: Diagnosis present

## 2017-12-12 LAB — RAPID URINE DRUG SCREEN, HOSP PERFORMED
Amphetamines: NOT DETECTED
BENZODIAZEPINES: NOT DETECTED
Barbiturates: NOT DETECTED
Cocaine: NOT DETECTED
OPIATES: NOT DETECTED
TETRAHYDROCANNABINOL: POSITIVE — AB

## 2017-12-12 LAB — COMPREHENSIVE METABOLIC PANEL
ALK PHOS: 52 U/L (ref 38–126)
ALT: 53 U/L — ABNORMAL HIGH (ref 0–44)
ANION GAP: 10 (ref 5–15)
AST: 148 U/L — ABNORMAL HIGH (ref 15–41)
Albumin: 4.1 g/dL (ref 3.5–5.0)
BUN: 10 mg/dL (ref 6–20)
CALCIUM: 8.8 mg/dL — AB (ref 8.9–10.3)
CO2: 25 mmol/L (ref 22–32)
Chloride: 102 mmol/L (ref 98–111)
Creatinine, Ser: 1.22 mg/dL (ref 0.61–1.24)
Glucose, Bld: 200 mg/dL — ABNORMAL HIGH (ref 70–99)
Potassium: 3.6 mmol/L (ref 3.5–5.1)
SODIUM: 137 mmol/L (ref 135–145)
TOTAL PROTEIN: 7.5 g/dL (ref 6.5–8.1)
Total Bilirubin: 0.8 mg/dL (ref 0.3–1.2)

## 2017-12-12 LAB — CBC
HCT: 39 % (ref 39.0–52.0)
HEMOGLOBIN: 12.8 g/dL — AB (ref 13.0–17.0)
MCH: 29.4 pg (ref 26.0–34.0)
MCHC: 32.8 g/dL (ref 30.0–36.0)
MCV: 89.4 fL (ref 78.0–100.0)
Platelets: 210 10*3/uL (ref 150–400)
RBC: 4.36 MIL/uL (ref 4.22–5.81)
RDW: 13 % (ref 11.5–15.5)
WBC: 8 10*3/uL (ref 4.0–10.5)

## 2017-12-12 LAB — ETHANOL: Alcohol, Ethyl (B): <10 mg/dL (ref <10)

## 2017-12-12 LAB — CK, TOTAL(REFL): CK TOTAL: 3668 U/L — AB (ref 44–196)

## 2017-12-12 LAB — ACETAMINOPHEN LEVEL: Acetaminophen (Tylenol), Serum: <10 ug/mL — ABNORMAL LOW (ref 10–30)

## 2017-12-12 LAB — RPR: RPR Ser Ql: NONREACTIVE

## 2017-12-12 LAB — CK: CK TOTAL: 4370 U/L — AB (ref 49–397)

## 2017-12-12 LAB — HIV ANTIBODY (ROUTINE TESTING W REFLEX): HIV 1&2 Ab, 4th Generation: NONREACTIVE

## 2017-12-12 LAB — SALICYLATE LEVEL: Salicylate Lvl: <7 mg/dL (ref 2.8–30.0)

## 2017-12-12 MED ORDER — ADULT MULTIVITAMIN W/MINERALS CH
1.0000 | ORAL_TABLET | Freq: Every day | ORAL | Status: DC
  Administered 2017-12-13: 1 via ORAL
  Filled 2017-12-12: qty 1

## 2017-12-12 MED ORDER — NICOTINE 21 MG/24HR TD PT24
21.0000 mg | MEDICATED_PATCH | Freq: Every day | TRANSDERMAL | Status: DC
  Administered 2017-12-13: 21 mg via TRANSDERMAL
  Filled 2017-12-12: qty 1

## 2017-12-12 MED ORDER — IOPAMIDOL (ISOVUE-300) INJECTION 61%
100.0000 mL | Freq: Once | INTRAVENOUS | Status: AC | PRN
  Administered 2017-12-12: 100 mL via INTRAVENOUS

## 2017-12-12 MED ORDER — SODIUM CHLORIDE 0.9 % IV BOLUS
2000.0000 mL | Freq: Once | INTRAVENOUS | Status: AC
  Administered 2017-12-12: 2000 mL via INTRAVENOUS

## 2017-12-12 MED ORDER — DIVALPROEX SODIUM ER 500 MG PO TB24
1000.0000 mg | ORAL_TABLET | Freq: Every day | ORAL | Status: DC
  Administered 2017-12-12: 1000 mg via ORAL
  Filled 2017-12-12: qty 2

## 2017-12-12 MED ORDER — OLANZAPINE 10 MG PO TABS
10.0000 mg | ORAL_TABLET | Freq: Two times a day (BID) | ORAL | Status: DC
  Administered 2017-12-12 – 2017-12-13 (×2): 10 mg via ORAL
  Filled 2017-12-12 (×2): qty 1

## 2017-12-12 MED ORDER — POLYETHYLENE GLYCOL 3350 17 G PO PACK
17.0000 g | PACK | Freq: Every day | ORAL | Status: DC | PRN
  Administered 2017-12-13: 17 g via ORAL
  Filled 2017-12-12 (×3): qty 1

## 2017-12-12 MED ORDER — ACETAMINOPHEN 325 MG PO TABS
650.0000 mg | ORAL_TABLET | Freq: Four times a day (QID) | ORAL | Status: DC | PRN
  Administered 2017-12-13: 650 mg via ORAL
  Filled 2017-12-12: qty 2

## 2017-12-12 MED ORDER — FAMOTIDINE 20 MG PO TABS
20.0000 mg | ORAL_TABLET | Freq: Every day | ORAL | Status: DC
  Administered 2017-12-13: 20 mg via ORAL
  Filled 2017-12-12: qty 1

## 2017-12-12 NOTE — ED Notes (Signed)
[id number] Expected date:  Expected time:  Means of arrival:  Comments: [name]

## 2017-12-12 NOTE — ED Notes (Signed)
Sitter stated he would record vitals every hour

## 2017-12-12 NOTE — ED Notes (Signed)
On admission to the Acute Unit pt when straight to bed and put the blanket over his head. He believes that he is here because he is having difficulty using the bathroom.  He is disheveled and has body odor. He is a poor historian. Unable to complete comprehensive psychosocial assessment at this time because pt is not participating with this Clinical research associatewriter. Pt made comfortable, given the ice water that he requested.

## 2017-12-12 NOTE — ED Notes (Signed)
Bed: [facility] Expected date:  Expected time:  Means of arrival:  Comments: 

## 2017-12-12 NOTE — ED Notes (Signed)
12/13/2107, Pt. Called for test results, was speaking with him and his phone went dead.  Will await for pt. To call back

## 2017-12-12 NOTE — ED Notes (Signed)
Bed: [facility]07 Expected date:  Expected time:  Means of arrival:  Comments: [id number]
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
## 2017-12-12 NOTE — ED Notes (Signed)
Patient transported to CT, unable to obtain vitals at the moment 

## 2017-12-12 NOTE — ED Triage Notes (Addendum)
He has multiple complaints. His main complaint is lower abdominal and rectal pain x2-3 years. He states that he is suicidal because of this pain. He also states that he may be able to used the restroom today. He is withdrawn in triage. A&Ox4. Ambulatory.

## 2017-12-12 NOTE — ED Notes (Signed)
12/12/2017, Pt. Called STD results reviewed with Pt. All questions answered.

## 2017-12-12 NOTE — ED Provider Notes (Signed)
[facility]-EMERGENCY DEPT Provider Note   CSN: [id number] Arrival date & time: 12/12/17  1634   History   Chief Complaint Chief Complaint  Patient presents with  . Abdominal Pain  . Suicidal    HPI Alfred [name] is a 42 y.o. male who presents with history of schizophrenia, seizures, migraine, hypertension, depression, alcohol abuse. He states that he's been having abdominal pain for years and is constipated. He also endorses that he is "psychotic". He feels that people are threatening him but cannot identify who they are. He has not been taking his meds as prescribed and cannot tell me why. He saw his PCP yesterday and was restarted on the Olanzapine. He did take it this morning but hasn't taken tonight's dose. He was recently in the ED on 9/11 for bilateral leg pain and was noted to have elevated CK. He states his CK is always elevated because he is always so tense from his psychiatric issues.    HPI  Past Medical History:  Diagnosis Date  . Alcohol abuse   . Depression   . Hypertension   . Migraines   . Schizophrenia (HCC)   . Seizure (HCC)    alcoho    Patient Active Problem List   Diagnosis Date Noted  . Localization-related idiopathic epilepsy and epileptic syndromes with seizures of localized onset, not intractable, without status epilepticus (HCC) 06/21/2017  . Schizophrenia (HCC) 06/19/2017  . Gastroesophageal reflux disease with esophagitis   . Cocaine abuse w/cocaine-induced psychotic disorder w/hallucinations (HCC) 12/07/2016  . Cannabis abuse with psychotic disorder (HCC) 12/07/2016  . Pericarditis 12/06/2016  . Rhabdomyolysis 12/06/2016  . EtOH dependence (HCC) 12/06/2016  . Transient alteration of awareness 08/28/2016  . Jerky body movements 08/28/2016  . Seizures (HCC) 06/18/2016  . Internal hemorrhoids 09/30/2013  . Acute psychosis (HCC) 05/29/2006  . TOBACCO DEPENDENCE 05/29/2006    Past Surgical History:  Procedure  Laterality Date  . WISDOM TOOTH EXTRACTION          Home Medications    Prior to Admission medications   Medication Sig Start Date End Date Taking? Authorizing Provider  acetaminophen (TYLENOL) 325 MG tablet Take 2 tablets (650 mg total) by mouth every 6 (six) hours as needed for mild pain, fever or headache. 12/08/16   Vassie LollMadera, Carlos, MD  divalproex (DEPAKOTE ER) 500 MG 24 hr tablet Take 2 tablets (1,000 mg total) by mouth at bedtime. 06/13/17   Van ClinesAquino, Karen M, MD  Multiple Vitamin (MULTIVITAMIN WITH MINERALS) TABS tablet Take 1 tablet by mouth daily. 12/09/16   Vassie LollMadera, Carlos, MD  nicotine (NICODERM CQ - DOSED IN MG/24 HOURS) 21 mg/24hr patch Place 1 patch (21 mg total) onto the skin daily. 12/09/16   Vassie LollMadera, Carlos, MD  OLANZapine (ZYPREXA) 10 MG tablet Take 1 tablet (10 mg total) by mouth 2 (two) times daily. 12/11/17 01/10/18  Nafziger, Kandee Keenory, NP  omeprazole (PRILOSEC) 40 MG capsule Take 1 capsule (40 mg total) by mouth daily. 12/08/16   Vassie LollMadera, Carlos, MD  omeprazole (PRILOSEC) 40 MG capsule TAKE 1 CAPSULE BY MOUTH EVERY DAY 07/03/17   Nafziger, Kandee Keenory, NP  polyethylene glycol (MIRALAX / GLYCOLAX) packet Take 17 g by mouth daily as needed for moderate constipation. 12/08/16   Vassie LollMadera, Carlos, MD  ranitidine (ZANTAC) 150 MG tablet TAKE 1 TABLET BY MOUTH AT BEDTIME 07/11/17   Shirline FreesNafziger, Cory, NP    Family History Family History  Problem Relation Age of Onset  . Diabetes Mother   . Hypertension Mother   .  Hyperlipidemia Mother   . Anemia Mother   . Heart murmur Mother   . Colon polyps Maternal Uncle   . Heart disease Maternal Uncle   . Celiac disease Maternal Grandmother   . Diabetes Maternal Grandmother   . Diabetes Maternal Grandfather     Social History Social History   Tobacco Use  . Smoking status: Current Some Day Smoker    Packs/day: 1.00    Types: Cigarettes  . Smokeless tobacco: Never Used  Substance Use Topics  . Alcohol use: Yes  . Drug use: Yes    Types: Marijuana      Allergies   Patient has no known allergies.   Review of Systems Review of Systems  Unable to perform ROS: Psychiatric disorder  Gastrointestinal: Positive for abdominal pain and constipation.  Psychiatric/Behavioral: Positive for hallucinations.     Physical Exam Updated Vital Signs BP 124/89 (BP Location: Right Arm)   Pulse (!) 113   Temp 98.7 F (37.1 C) (Oral)   Resp 16   SpO2 96%   Physical Exam  Constitutional: He is oriented to person, place, and time. He appears well-developed and well-nourished. No distress.  Disheveled  HENT:  Head: Normocephalic and atraumatic.  Eyes: Pupils are equal, round, and reactive to light. Conjunctivae are normal. Right eye exhibits no discharge. Left eye exhibits no discharge. No scleral icterus.  Neck: Normal range of motion.  Cardiovascular: Tachycardia present. Exam reveals no gallop and no friction rub.  No murmur heard. Pulmonary/Chest: Effort normal and breath sounds normal. No respiratory distress.  Abdominal: Soft. Bowel sounds are normal. He exhibits no distension. There is no tenderness.  Neurological: He is alert and oriented to person, place, and time.  Skin: Skin is warm and dry.  Psychiatric: He has a normal mood and affect. His behavior is normal.  Nursing note and vitals reviewed.    ED Treatments / Results  Labs (all labs ordered are listed, but only abnormal results are displayed) Labs Reviewed  COMPREHENSIVE METABOLIC PANEL - Abnormal; Notable for the following components:      Result Value   Glucose, Bld 200 (*)    Calcium 8.8 (*)    AST 148 (*)    ALT 53 (*)    All other components within normal limits  ACETAMINOPHEN LEVEL - Abnormal; Notable for the following components:   Acetaminophen (Tylenol), Serum <10 (*)    All other components within normal limits  CBC - Abnormal; Notable for the following components:   Hemoglobin 12.8 (*)    All other components within normal limits  RAPID URINE DRUG  SCREEN, HOSP PERFORMED - Abnormal; Notable for the following components:   Tetrahydrocannabinol POSITIVE (*)    All other components within normal limits  CK - Abnormal; Notable for the following components:   Total CK 4,370 (*)    All other components within normal limits  ETHANOL  SALICYLATE LEVEL    EKG None  Radiology Ct Abdomen Pelvis W Contrast  Result Date: 12/12/2017 CLINICAL DATA:  Lower abdominal and rectal pain for 2-3 years. EXAM: CT ABDOMEN AND PELVIS WITH CONTRAST TECHNIQUE: Multidetector CT imaging of the abdomen and pelvis was performed using the standard protocol following bolus administration of intravenous contrast. CONTRAST:  ISOVUE-300 IOPAMIDOL (ISOVUE-300) INJECTION 61% COMPARISON:  12/06/2016 right upper quadrant ultrasound. No prior CT. FINDINGS: Lower chest: Clear lung bases. Normal heart size without pericardial or pleural effusion. Hepatobiliary: Mild motion degradation throughout the abdomen. A too small to characterize segment 4A  liver lesion. Normal gallbladder, without biliary ductal dilatation. Pancreas: Normal, without mass or ductal dilatation. Spleen: Normal in size, without focal abnormality. Adrenals/Urinary Tract: Normal adrenal glands. Normal kidneys, without hydronephrosis. Normal urinary bladder. Stomach/Bowel: Normal stomach, without wall thickening. Normal colon, appendix, and terminal ileum. Normal small bowel. Vascular/Lymphatic: Normal caliber of the aorta and branch vessels. No abdominopelvic adenopathy. Reproductive: Normal prostate. Other: No significant free fluid. Musculoskeletal: Remote trauma or surgical defect about the posterior right iliac. Convex left lumbar spine curvature is mild. IMPRESSION: 1. Mild motion degradation. 2.  No acute process in the abdomen or pelvis. Electronically Signed   By: Jeronimo Greaves M.D.   On: 12/12/2017 20:28    Procedures Procedures (including critical care time)  Medications Ordered in ED Medications   acetaminophen (TYLENOL) tablet 650 mg (has no administration in time range)  divalproex (DEPAKOTE ER) 24 hr tablet 1,000 mg (has no administration in time range)  multivitamin with minerals tablet 1 tablet (has no administration in time range)  nicotine (NICODERM CQ - dosed in mg/24 hours) patch 21 mg (has no administration in time range)  OLANZapine (ZYPREXA) tablet 10 mg (has no administration in time range)  polyethylene glycol (MIRALAX / GLYCOLAX) packet 17 g (has no administration in time range)  famotidine (PEPCID) tablet 20 mg (has no administration in time range)  sodium chloride 0.9 % bolus 2,000 mL (0 mLs Intravenous Stopped 12/12/17 2226)  iopamidol (ISOVUE-300) 61 % injection 100 mL (100 mLs Intravenous Contrast Given 12/12/17 1941)     Initial Impression / Assessment and Plan / ED Course  I have reviewed the triage vital signs and the nursing notes.  Pertinent labs & imaging results that were available during my care of the patient were reviewed by me and considered in my medical decision making (see chart for details).  42 year old male presents with chronic abdominal pain, constipation, and complaints of hallucinations. He is tachycardic on initial exam. This has improved on recheck after fluids. CBC is normal chondromalacia patella is remarkable for mild elevation of SCr (1.2) which is similar to yesterday. CK is elevated as well to 4000K - it appears this is chronic. UDS is positive for THC. CT abdomen/pelvis is negative. He is medically cleared for TTS.  Final Clinical Impressions(s) / ED Diagnoses   Final diagnoses:  Chronic abdominal pain  Elevated CK  Schizophrenia, unspecified type [facility])    ED Discharge Orders    None       Bethel Born, PA-C 12/12/17 2250    Azalia Bilis, MD 12/13/17 [phone]

## 2017-12-12 NOTE — Progress Notes (Signed)
TTS consulted with Spencer Simon, PA who recommends inpt treatment. EDP BethDonell Sievertel BornGekas, Kelly Marie, PA-C and pt's nurse Ronnell FreshwaterLondon, Latricia L, RN have been advised. TTS to seek placement.  Princess BruinsAquicha Kemoni Ortega, MSW, LCSW Therapeutic Triage Specialist  [phone]

## 2017-12-12 NOTE — ED Notes (Signed)
Pt would not let scan arm under blanket no scan on fluids

## 2017-12-13 ENCOUNTER — Inpatient Hospital Stay (HOSPITAL_COMMUNITY)
Admission: AD | Admit: 2017-12-13 | Discharge: 2017-12-19 | DRG: 885 | Disposition: A | Discharge status: Home / Self Care | Payer: Medicare HMO | Source: Intra-hospital | Attending: Psychiatry | Admitting: Psychiatry

## 2017-12-13 ENCOUNTER — Encounter (HOSPITAL_COMMUNITY): Payer: Self-pay | Admitting: *Deleted

## 2017-12-13 DIAGNOSIS — Z8349 Family history of other endocrine, nutritional and metabolic diseases: Secondary | ICD-10-CM

## 2017-12-13 DIAGNOSIS — F419 Anxiety disorder, unspecified: Secondary | ICD-10-CM | POA: Diagnosis not present

## 2017-12-13 DIAGNOSIS — F200 Paranoid schizophrenia: Secondary | ICD-10-CM | POA: Diagnosis present

## 2017-12-13 DIAGNOSIS — F17210 Nicotine dependence, cigarettes, uncomplicated: Secondary | ICD-10-CM | POA: Diagnosis present

## 2017-12-13 DIAGNOSIS — K219 Gastro-esophageal reflux disease without esophagitis: Secondary | ICD-10-CM | POA: Diagnosis not present

## 2017-12-13 DIAGNOSIS — Z8379 Family history of other diseases of the digestive system: Secondary | ICD-10-CM | POA: Diagnosis not present

## 2017-12-13 DIAGNOSIS — G4700 Insomnia, unspecified: Secondary | ICD-10-CM | POA: Diagnosis not present

## 2017-12-13 DIAGNOSIS — Z79899 Other long term (current) drug therapy: Secondary | ICD-10-CM | POA: Diagnosis not present

## 2017-12-13 DIAGNOSIS — Z560 Unemployment, unspecified: Secondary | ICD-10-CM

## 2017-12-13 DIAGNOSIS — K2100 Gastro-esophageal reflux disease with esophagitis, without bleeding: Secondary | ICD-10-CM

## 2017-12-13 DIAGNOSIS — Z8371 Family history of colonic polyps: Secondary | ICD-10-CM | POA: Diagnosis not present

## 2017-12-13 DIAGNOSIS — I10 Essential (primary) hypertension: Secondary | ICD-10-CM | POA: Diagnosis present

## 2017-12-13 DIAGNOSIS — Z833 Family history of diabetes mellitus: Secondary | ICD-10-CM

## 2017-12-13 DIAGNOSIS — Z8249 Family history of ischemic heart disease and other diseases of the circulatory system: Secondary | ICD-10-CM

## 2017-12-13 DIAGNOSIS — Z23 Encounter for immunization: Secondary | ICD-10-CM | POA: Diagnosis not present

## 2017-12-13 DIAGNOSIS — G40009 Localization-related (focal) (partial) idiopathic epilepsy and epileptic syndromes with seizures of localized onset, not intractable, without status epilepticus: Secondary | ICD-10-CM | POA: Diagnosis not present

## 2017-12-13 DIAGNOSIS — R45851 Suicidal ideations: Secondary | ICD-10-CM

## 2017-12-13 DIAGNOSIS — F329 Major depressive disorder, single episode, unspecified: Secondary | ICD-10-CM | POA: Diagnosis present

## 2017-12-13 DIAGNOSIS — R69 Illness, unspecified: Secondary | ICD-10-CM | POA: Diagnosis not present

## 2017-12-13 DIAGNOSIS — K5900 Constipation, unspecified: Secondary | ICD-10-CM | POA: Diagnosis present

## 2017-12-13 DIAGNOSIS — F209 Schizophrenia, unspecified: Secondary | ICD-10-CM | POA: Diagnosis not present

## 2017-12-13 LAB — CK TOTAL AND CKMB (NOT AT ARMC)
CK, MB: 13.7 ng/mL — ABNORMAL HIGH (ref 0.5–5.0)
RELATIVE INDEX: 0.4 (ref 0.0–2.5)
Total CK: 3503 U/L — ABNORMAL HIGH (ref 49–397)

## 2017-12-13 MED ORDER — LINACLOTIDE 145 MCG PO CAPS
290.0000 ug | ORAL_CAPSULE | Freq: Every day | ORAL | Status: DC
  Administered 2017-12-13: 290 ug via ORAL
  Filled 2017-12-13: qty 2

## 2017-12-13 MED ORDER — DIVALPROEX SODIUM ER 500 MG PO TB24: 500.0000 mg | ORAL_TABLET | Freq: Two times a day (BID) | ORAL | Status: DC

## 2017-12-13 MED ORDER — TRAZODONE HCL 50 MG PO TABS
50.0000 mg | ORAL_TABLET | Freq: Every evening | ORAL | Status: DC | PRN
  Administered 2017-12-14 – 2017-12-16 (×3): 50 mg via ORAL
  Filled 2017-12-13: qty 1
  Filled 2017-12-13: qty 7

## 2017-12-13 MED ORDER — MAGNESIUM HYDROXIDE 400 MG/5ML PO SUSP: 30.0000 mL | Freq: Every day | ORAL | Status: DC | PRN

## 2017-12-13 MED ORDER — PNEUMOCOCCAL VAC POLYVALENT 25 MCG/0.5ML IJ INJ
0.5000 mL | INJECTION | INTRAMUSCULAR | Status: AC
  Administered 2017-12-14: 0.5 mL via INTRAMUSCULAR

## 2017-12-13 MED ORDER — FAMOTIDINE 20 MG PO TABS
20.0000 mg | ORAL_TABLET | Freq: Every day | ORAL | Status: DC
  Administered 2017-12-14 – 2017-12-19 (×6): 20 mg via ORAL
  Filled 2017-12-13 (×4): qty 1
  Filled 2017-12-13: qty 7
  Filled 2017-12-13 (×3): qty 1

## 2017-12-13 MED ORDER — NICOTINE 21 MG/24HR TD PT24
21.0000 mg | MEDICATED_PATCH | Freq: Every day | TRANSDERMAL | Status: DC
  Administered 2017-12-14 – 2017-12-19 (×6): 21 mg via TRANSDERMAL
  Filled 2017-12-13 (×8): qty 1

## 2017-12-13 MED ORDER — ALUM & MAG HYDROXIDE-SIMETH 200-200-20 MG/5ML PO SUSP: 30.0000 mL | ORAL | Status: DC | PRN

## 2017-12-13 MED ORDER — ADULT MULTIVITAMIN W/MINERALS CH
1.0000 | ORAL_TABLET | Freq: Every day | ORAL | Status: DC
  Administered 2017-12-14 – 2017-12-19 (×6): 1 via ORAL
  Filled 2017-12-13 (×8): qty 1

## 2017-12-13 MED ORDER — LORAZEPAM 1 MG PO TABS
1.0000 mg | ORAL_TABLET | Freq: Three times a day (TID) | ORAL | Status: DC
  Administered 2017-12-13: 1 mg via ORAL
  Filled 2017-12-13: qty 1

## 2017-12-13 MED ORDER — LORAZEPAM 1 MG PO TABS
1.0000 mg | ORAL_TABLET | Freq: Three times a day (TID) | ORAL | Status: AC
  Administered 2017-12-14 – 2017-12-15 (×4): 1 mg via ORAL
  Filled 2017-12-13 (×4): qty 1

## 2017-12-13 MED ORDER — HYDROXYZINE HCL 25 MG PO TABS
25.0000 mg | ORAL_TABLET | Freq: Three times a day (TID) | ORAL | Status: DC | PRN
  Administered 2017-12-15 – 2017-12-18 (×3): 25 mg via ORAL
  Filled 2017-12-13: qty 1
  Filled 2017-12-13: qty 10

## 2017-12-13 MED ORDER — POLYETHYLENE GLYCOL 3350 17 G PO PACK: 17.0000 g | PACK | Freq: Every day | ORAL | Status: DC | PRN

## 2017-12-13 MED ORDER — ACETAMINOPHEN 325 MG PO TABS
650.0000 mg | ORAL_TABLET | Freq: Four times a day (QID) | ORAL | Status: DC | PRN
  Administered 2017-12-15: 650 mg via ORAL
  Filled 2017-12-13 (×2): qty 2

## 2017-12-13 NOTE — Tx Team (Signed)
Initial Treatment Plan 12/13/2017 5:09 PM Alfred [name] OZH:[id number]    PATIENT STRESSORS: Financial difficulties Medication change or noncompliance   PATIENT STRENGTHS: Ability for insight Motivation for treatment/growth   PATIENT IDENTIFIED PROBLEMS: Pyschotic    Anxiety                 DISCHARGE CRITERIA:  Improved stabilization in mood, thinking, and/or behavior Need for constant or close observation no longer present  PRELIMINARY DISCHARGE PLAN: Return to previous living arrangement  PATIENT/FAMILY INVOLVEMENT: This treatment plan has been presented to and reviewed with the patient, Alfred [name], and/or family member.  The patient and family have been given the opportunity to ask questions and make suggestions.  [name], [name], RN 12/13/2017, 5:09 PM

## 2017-12-13 NOTE — Progress Notes (Signed)
Did not attend group 

## 2017-12-13 NOTE — Progress Notes (Signed)
12/13/17 1437  [facility] [phone]. Report given to Gin. Gin aware of CK levels and that he needs repeat CK on Sunday and Monday. [facility]5 hours. Will arrange transportation.

## 2017-12-13 NOTE — Consult Note (Signed)
[place] [facility]iatry Consult   Reason for Consult:  Referring Physician:  Psychosis, suicidal Patient Identification: Alfred [name] MRN:  [id number] Principal Diagnosis: Paranoid schizophrenia [facility]) Diagnosis:   Patient Active Problem List   Diagnosis Date Noted  . 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 Paranoid schizophrenia ([place]) [F20.0] 06/19/2017  . Gastroesophageal reflux disease with esophagitis [K21.0]   . Cocaine abuse w/cocaine-induced psychotic disorder w/hallucinations ([place]) [F14.151] 12/07/2016  . Cannabis abuse with psychotic disorder ([place]) [F12.159] 12/07/2016  . Pericarditis [I31.9] 12/06/2016  . Rhabdomyolysis [M62.82] 12/06/2016  . EtOH dependence ([place]) [F10.20] 12/06/2016  . Transient alteration of awareness [R40.4] 08/28/2016  . Jerky body movements [R25.8] 08/28/2016  . Seizures ([place]) [R56.9] 06/18/2016  . Internal hemorrhoids [K64.8] 09/30/2013  . Acute psychosis ([place]) [F23] 05/29/2006  . TOBACCO DEPENDENCE [F17.200] 05/29/2006    Total Time spent with patient: 45 minutes  Subjective:   Alfred [name] is a 42 y.o. male patient admitted with abdominal, psychosis and suicidal thoughts.  HPI:  Patient with long history of Paranoid schizophrenia, seizures disorder, migraine, hypertension, Cannabis use disorder and alcohol abuse. Patient presents with auditory/visual hallucination, paranoia, chronic constipation which he claims has been making him suicidal. Per chart, patient was recently admitted to [facility] (12/10/17), found to have elevated CK-2398, got some IV fluid but left against medical advice. He was brought back to the ED yesterday and CK was still elevated(4370) but was sent to [facility] for psych evaluation.   Past Psychiatric History: as above  Risk to Self: Suicidal Ideation: Yes-Currently Present Suicidal Intent:  No Is patient at risk for suicide?: Yes Suicidal Plan?: No Access to Means: No What has been your use of drugs/alcohol within the last 12 months?: cannabis, alcohol  Triggers for Past Attempts: None known Intentional Self Injurious Behavior: None Risk to Others: Homicidal Ideation: No Thoughts of Harm to Others: No Current Homicidal Intent: No Current Homicidal Plan: No Access to Homicidal Means: No History of harm to others?: No Assessment of Violence: None Noted Does patient have access to weapons?: No Criminal Charges Pending?: No Does patient have a court date: No Prior Inpatient Therapy: Prior Inpatient Therapy: Yes Prior Therapy Dates: unknown Prior Therapy Facilty/Provider(s): [facility] Reason for Treatment: Schizophrenia Prior Outpatient Therapy: Prior Outpatient Therapy: Yes Prior Therapy Dates: current Prior Therapy Facilty/Provider(s): [facility] Reason for Treatment: med management  Does patient have an ACCT team?: No Does patient have Intensive In-House Services?  : No Does patient have [facility] services? : Yes Does patient have P4CC services?: No  Past Medical History:  Past Medical History:  Diagnosis Date  . Alcohol abuse   . Depression   . Hypertension   . Migraines   . Schizophrenia ([place])   . Seizure ([place])    alcoho    Past Surgical History:  Procedure Laterality Date  . WISDOM TOOTH EXTRACTION     Family History:  Family History  Problem Relation Age of Onset  . Diabetes Mother   . Hypertension Mother   . Hyperlipidemia Mother   . Anemia Mother   . Heart murmur Mother   . Colon polyps Maternal Uncle   . Heart disease Maternal Uncle   . Celiac disease Maternal Grandmother   . Diabetes Maternal Grandmother   . Diabetes Maternal Grandfather    Family Psychiatric  History: Social History:  Social History   Substance and Sexual Activity  Alcohol Use Yes  Social History   Substance and Sexual Activity  Drug Use Yes  . Types:  Marijuana    Social History   Socioeconomic History  . Marital status: Single    Spouse name: Not on file  . Number of children: 3  . Years of education: Not on file  . Highest education level: Not on file  Occupational History  . Occupation: Unemployed  Social Needs  . Financial resource strain: Not on file  . Food insecurity:    Worry: Not on file    Inability: Not on file  . Transportation needs:    Medical: Not on file    Non-medical: Not on file  Tobacco Use  . Smoking status: Current Some Day Smoker    Packs/day: 1.00    Types: Cigarettes  . Smokeless tobacco: Never Used  Substance and Sexual Activity  . Alcohol use: Yes  . Drug use: Yes    Types: Marijuana  . Sexual activity: Not on file  Lifestyle  . Physical activity:    Days per week: Not on file    Minutes per session: Not on file  . Stress: Not on file  Relationships  . Social connections:    Talks on phone: Not on file    Gets together: Not on file    Attends religious service: Not on file    Active member of club or organization: Not on file    Attends meetings of clubs or organizations: Not on file    Relationship status: Not on file  Other Topics Concern  . Not on file  Social History Narrative  . Not on file   Additional Social History:    Allergies:  No Known Allergies  Labs:  Results for orders placed or performed during the hospital encounter of 12/12/17 (from the past 48 hour(s))  Comprehensive metabolic panel     Status: Abnormal   Collection Time: 12/12/17  5:48 PM  Result Value Ref Range   Sodium 137 135 - 145 mmol/L   Potassium 3.6 3.5 - 5.1 mmol/L   Chloride 102 98 - 111 mmol/L   CO2 25 22 - 32 mmol/L   Glucose, Bld 200 (H) 70 - 99 mg/dL   BUN 10 6 - 20 mg/dL   Creatinine, Ser 1.22 0.61 - 1.24 mg/dL   Calcium 8.8 (L) 8.9 - 10.3 mg/dL   Total Protein 7.5 6.5 - 8.1 g/dL   Albumin 4.1 3.5 - 5.0 g/dL   AST 148 (H) 15 - 41 U/L   ALT 53 (H) 0 - 44 U/L   Alkaline Phosphatase 52  38 - 126 U/L   Total Bilirubin 0.8 0.3 - 1.2 mg/dL   GFR calc non Af Amer >60 >60 mL/min   GFR calc Af Amer >60 >60 mL/min    Comment: (NOTE) The eGFR has been calculated using the CKD EPI equation. This calculation has not been validated in all clinical situations. eGFR's persistently <60 mL/min signify possible Chronic Kidney Disease.    Anion gap 10 5 - 15    Comment: Performed at [facility], [place] [address], [place], [place] [address]  Ethanol     Status: None   Collection Time: 12/12/17  5:48 PM  Result Value Ref Range   Alcohol, Ethyl (B) <10 <10 mg/dL    Comment: (NOTE) Lowest detectable limit for serum alcohol is 10 mg/dL. For medical purposes only. Performed at [facility], [place] [address], [place], [place] [address]  Salicylate level     Status: None   Collection Time: 12/12/17  5:48 PM  Result Value Ref Range   Salicylate Lvl <7.1 2.8 - 30.0 mg/dL    Comment: Performed at [facility], [place] [address], [place], [place] [address]  Acetaminophen level     Status: Abnormal   Collection Time: 12/12/17  5:48 PM  Result Value Ref Range   Acetaminophen (Tylenol), Serum <10 (L) 10 - 30 ug/mL    Comment: (NOTE) Therapeutic concentrations vary significantly. A range of 10-30 ug/mL  may be an effective concentration for many patients. However, some  are best treated at concentrations outside of this range. Acetaminophen concentrations >150 ug/mL at 4 hours after ingestion  and >50 ug/mL at 12 hours after ingestion are often associated with  toxic reactions. Performed at [facility], [place] [address] [place], [place] [address]   cbc     Status: Abnormal   Collection Time: 12/12/17  5:48 PM  Result Value Ref Range   WBC 8.0 4.0 - 10.5 K/uL   RBC 4.36 4.22 - 5.81 MIL/uL   Hemoglobin 12.8 (L) 13.0 - 17.0 g/dL   HCT 39.0 39.0 - 52.0 %   MCV 89.4 78.0 - 100.0 fL   MCH 29.4 26.0 - 34.0 pg   MCHC  32.8 30.0 - 36.0 g/dL   RDW 13.0 11.5 - 15.5 %   Platelets 210 150 - 400 K/uL    Comment: Performed at [facility], [place] [address] [place], [place] [address]  CK     Status: Abnormal   Collection Time: 12/12/17  5:48 PM  Result Value Ref Range   Total CK 4,370 (H) 49 - 397 U/L    Comment: RESULTS CONFIRMED BY MANUAL DILUTION Performed at [facility], [place] [address], [place], [place] [address]   Rapid urine drug screen (hospital performed)     Status: Abnormal   Collection Time: 12/12/17  8:45 PM  Result Value Ref Range   Opiates NONE DETECTED NONE DETECTED   Cocaine NONE DETECTED NONE DETECTED   Benzodiazepines NONE DETECTED NONE DETECTED   Amphetamines NONE DETECTED NONE DETECTED   Tetrahydrocannabinol POSITIVE (A) NONE DETECTED   Barbiturates NONE DETECTED NONE DETECTED    Comment: (NOTE) DRUG SCREEN FOR MEDICAL PURPOSES ONLY.  IF CONFIRMATION IS NEEDED FOR ANY PURPOSE, NOTIFY LAB WITHIN 5 DAYS. LOWEST DETECTABLE LIMITS FOR URINE DRUG SCREEN Drug Class                     Cutoff (ng/mL) Amphetamine and metabolites    1000 Barbiturate and metabolites    200 Benzodiazepine                 182 Tricyclics and metabolites     300 Opiates and metabolites        300 Cocaine and metabolites        300 THC                            50 Performed at [facility], [place] [address] [place], [place] [address]     Current Facility-Administered Medications  Medication Dose Route Frequency Provider Last Rate Last Dose  . acetaminophen (TYLENOL) tablet 650 mg  650 mg Oral Q6H PRN Recardo Evangelist, PA-C      . famotidine (PEPCID) tablet 20 mg  20 mg Oral Daily Recardo Evangelist,  PA-C   20 mg at 12/13/17 0923  . linaclotide (LINZESS) capsule 290 mcg  290 mcg Oral QAC breakfast Ethelene Hal, NP   290 mcg at 12/13/17 1154  . LORazepam (ATIVAN) tablet 1 mg  1 mg Oral TID Jaleeah Slight, MD      . multivitamin with  minerals tablet 1 tablet  1 tablet Oral Daily Recardo Evangelist, PA-C   1 tablet at 12/13/17 6861  . nicotine (NICODERM CQ - dosed in mg/24 hours) patch 21 mg  21 mg Transdermal Daily Recardo Evangelist, PA-C   21 mg at 12/13/17 6837  . polyethylene glycol (MIRALAX / GLYCOLAX) packet 17 g  17 g Oral Daily PRN Recardo Evangelist, PA-C   17 g at 12/13/17 1108   Current Outpatient Medications  Medication Sig Dispense Refill  . divalproex (DEPAKOTE ER) 500 MG 24 hr tablet Take 2 tablets (1,000 mg total) by mouth at bedtime. 60 tablet 11  . Multiple Vitamin (MULTIVITAMIN WITH MINERALS) TABS tablet Take 1 tablet by mouth daily. 30 tablet 3  . nicotine (NICODERM CQ - DOSED IN MG/24 HOURS) 21 mg/24hr patch Place 1 patch (21 mg total) onto the skin daily. 28 patch 0  . OLANZapine (ZYPREXA) 10 MG tablet Take 1 tablet (10 mg total) by mouth 2 (two) times daily. 60 tablet 1  . polyethylene glycol (MIRALAX / GLYCOLAX) packet Take 17 g by mouth daily as needed for moderate constipation. 28 each 0  . acetaminophen (TYLENOL) 325 MG tablet Take 2 tablets (650 mg total) by mouth every 6 (six) hours as needed for mild pain, fever or headache. (Patient not taking: Reported on 12/12/2017) 40 tablet 0  . omeprazole (PRILOSEC) 40 MG capsule Take 1 capsule (40 mg total) by mouth daily. (Patient not taking: Reported on 12/12/2017) 30 capsule 2  . omeprazole (PRILOSEC) 40 MG capsule TAKE 1 CAPSULE BY MOUTH EVERY DAY (Patient not taking: Reported on 12/12/2017) 90 capsule 1  . ranitidine (ZANTAC) 150 MG tablet TAKE 1 TABLET BY MOUTH AT BEDTIME (Patient not taking: Reported on 12/12/2017) 90 tablet 3    Musculoskeletal: Strength & Muscle Tone: within normal limits Gait & Station: normal Patient leans: N/A  Psychiatric Specialty Exam: Physical Exam  Psychiatric: His mood appears anxious. His speech is delayed and tangential. He is withdrawn and actively hallucinating. Thought content is paranoid and delusional. Cognition  and memory are normal. He expresses impulsivity. He expresses suicidal ideation.    Review of Systems  Constitutional: Positive for malaise/fatigue.  HENT: Negative.   Eyes: Negative.   Respiratory: Negative.   Cardiovascular: Negative.   Gastrointestinal: Positive for abdominal pain.  Genitourinary: Negative.   Musculoskeletal: Positive for myalgias.  Skin: Negative.   Neurological: Negative.   Endo/Heme/Allergies: Negative.   Psychiatric/Behavioral: Positive for hallucinations, substance abuse and suicidal ideas. The patient is nervous/anxious.     Blood pressure 119/81, pulse 92, temperature 98.6 F (37 C), temperature source Oral, resp. rate 18, SpO2 98 %.There is no height or weight on file to calculate BMI.  General Appearance: Disheveled  Eye Contact:  Good  Speech:  Clear and Coherent and Slow  Volume:  Decreased  Mood:  Dysphoric  Affect:  Constricted  Thought Process:  Disorganized  Orientation:  Full (Time, Place, and Person)  Thought Content:  Illogical, Delusions and Hallucinations: Auditory  Suicidal Thoughts:  Yes.  without intent/plan  Homicidal Thoughts:  No  Memory:  Immediate;   Fair Recent;   Fair Remote;  Fair  Judgement:  Impaired  Insight:  Lacking  Psychomotor Activity:  Psychomotor Retardation  Concentration:  Concentration: Fair and Attention Span: Fair  Recall:  [employer] of Knowledge:  Fair  Language:  Fair  Akathisia:  No  Handed:  Right  AIMS (if indicated):     Assets:  Others:  family support  ADL's:  Impaired  Cognition:  WNL  Sleep:        Treatment Plan Summary: Daily contact with patient to assess and evaluate symptoms and progress in treatment and Medication management  -CK level daily x 3 days( CK has been rising in the last 4 days) -Will seek ED Physician consultation if CK continues to rise. -Encourage oral fluid intake. -Discontinue all antipsychotic due to elevated CK, May restart antipsychotic when CK level is less  than 400. -Lorazepam 1 mg tid for agitation x 3 days   Disposition: Recommend psychiatric Inpatient admission when medically cleared.  Corena Pilgrim, MD 12/13/2017 12:39 PM

## 2017-12-13 NOTE — BH Assessment (Signed)
[facility] later this date. AC to coordinate. Paperwork has been completed.

## 2017-12-13 NOTE — ED Notes (Signed)
Pt transferred from Main ED after IV fluids, CK abnormal.  Returned to Psych ED, A&O x 3, no distress noted, interactive with staff.  Monitoring for safety, Q 15 min checks in effect.

## 2017-12-13 NOTE — Progress Notes (Signed)
[facility]  Bed: [id number]  Call for report: [phone]  Enid Cutterharlotte Disha Cottam, LCSW-A Clinical Social Worker [phone]

## 2017-12-13 NOTE — Progress Notes (Signed)
12/13/17  @ 1544  [facility]

## 2017-12-13 NOTE — Progress Notes (Signed)
Patient ID: Jermery I Serio, male   DOB: 12/21/1975, 42 y.o.   MRN: [id number]    42 year old black male admitted [facility] that people were out to get him. [facility] , pt remained very paranoid and suspicious of others. Pt refused to do strip search at time of admission, he reported that he did not want anyone looking at him and that he did not smell right. This Clinical research associatewriter explained why the search needed to be done, pt did do search. Pt reported that he has a history of taking mediation, but has been running and has not taken. Pt reported that he smokes THC to help with the voices and anxiety. Pt reported that [facility]. Pt reported that he was positive AH/VH, without commands. Pt reported that he was also severely depressed, and rated his depression as a 10. Pt was oriented to the unit, no other issues or concerns noted.

## 2017-12-13 NOTE — Progress Notes (Signed)
TTS received a phone call from "pt's mother" requesting information regarding the pt. TTS advised the pt's mother that this writer could not disclose the pt's personal information over the phone as mother stated she does not have legal guardianship over the pt. Pt's mother then stated she is the person that brought the pt to the ED and she wants to know if his CK levels are still high. TTS apologized and advised again that personal information cannot be provided without the pt's consent over the telephone as we have no way to verify the callers identity. Pt's mother asked to speak with the pt's nurse. Call transferred.   Princess BruinsAquicha Kiyo Heal, MSW, LCSW Therapeutic Triage Specialist  [phone]

## 2017-12-14 DIAGNOSIS — Z560 Unemployment, unspecified: Secondary | ICD-10-CM

## 2017-12-14 DIAGNOSIS — F200 Paranoid schizophrenia: Principal | ICD-10-CM

## 2017-12-14 DIAGNOSIS — F419 Anxiety disorder, unspecified: Secondary | ICD-10-CM

## 2017-12-14 DIAGNOSIS — R45851 Suicidal ideations: Secondary | ICD-10-CM

## 2017-12-14 DIAGNOSIS — F17210 Nicotine dependence, cigarettes, uncomplicated: Secondary | ICD-10-CM

## 2017-12-14 LAB — COMPREHENSIVE METABOLIC PANEL
ALBUMIN: 3.4 g/dL — AB (ref 3.5–5.0)
ALT: 39 U/L (ref 0–44)
ANION GAP: 8 (ref 5–15)
AST: 72 U/L — ABNORMAL HIGH (ref 15–41)
Alkaline Phosphatase: 44 U/L (ref 38–126)
BUN: 10 mg/dL (ref 6–20)
CO2: 27 mmol/L (ref 22–32)
Calcium: 8.8 mg/dL — ABNORMAL LOW (ref 8.9–10.3)
Chloride: 106 mmol/L (ref 98–111)
Creatinine, Ser: 1.3 mg/dL — ABNORMAL HIGH (ref 0.61–1.24)
GFR calc non Af Amer: >60 mL/min (ref >60)
GLUCOSE: 109 mg/dL — AB (ref 70–99)
POTASSIUM: 4.3 mmol/L (ref 3.5–5.1)
SODIUM: 141 mmol/L (ref 135–145)
TOTAL PROTEIN: 6.2 g/dL — AB (ref 6.5–8.1)
Total Bilirubin: 0.5 mg/dL (ref 0.3–1.2)

## 2017-12-14 LAB — CK: Total CK: 1534 U/L — ABNORMAL HIGH (ref 49–397)

## 2017-12-14 LAB — VALPROIC ACID LEVEL

## 2017-12-14 MED ORDER — OLANZAPINE 5 MG PO TBDP
5.0000 mg | ORAL_TABLET | ORAL | Status: AC
  Administered 2017-12-14: 5 mg via ORAL
  Filled 2017-12-14 (×2): qty 1

## 2017-12-14 MED ORDER — PANTOPRAZOLE SODIUM 40 MG PO TBEC
40.0000 mg | DELAYED_RELEASE_TABLET | Freq: Every day | ORAL | Status: DC
  Administered 2017-12-14 – 2017-12-19 (×6): 40 mg via ORAL
  Filled 2017-12-14: qty 2
  Filled 2017-12-14 (×5): qty 1
  Filled 2017-12-14: qty 7
  Filled 2017-12-14 (×2): qty 1

## 2017-12-14 MED ORDER — POLYETHYLENE GLYCOL 3350 17 G PO PACK
17.0000 g | PACK | Freq: Every day | ORAL | Status: DC
  Administered 2017-12-14 – 2017-12-19 (×6): 17 g via ORAL
  Filled 2017-12-14 (×8): qty 1

## 2017-12-14 MED ORDER — OLANZAPINE 10 MG PO TBDP
10.0000 mg | ORAL_TABLET | Freq: Two times a day (BID) | ORAL | Status: DC
  Administered 2017-12-14 – 2017-12-19 (×11): 10 mg via ORAL
  Filled 2017-12-14 (×9): qty 1
  Filled 2017-12-14: qty 14
  Filled 2017-12-14 (×2): qty 1
  Filled 2017-12-14: qty 14
  Filled 2017-12-14 (×5): qty 1

## 2017-12-14 MED ORDER — OLANZAPINE 5 MG PO TBDP: 5.0000 mg | ORAL_TABLET | Freq: Two times a day (BID) | ORAL | Status: DC

## 2017-12-14 MED ORDER — DIVALPROEX SODIUM ER 500 MG PO TB24
500.0000 mg | ORAL_TABLET | Freq: Every day | ORAL | Status: DC
  Administered 2017-12-14 – 2017-12-17 (×4): 500 mg via ORAL
  Filled 2017-12-14: qty 1
  Filled 2017-12-14: qty 7
  Filled 2017-12-14 (×6): qty 1

## 2017-12-14 NOTE — Progress Notes (Signed)
DAR NOTE: Patient presents with anxious affect and depressed mood. Pt has been visible in the milieu but not interacting much with peers. Pt complained of anxiety. Denies pain, auditory and visual hallucinations.  Rates depression at 6, hopelessness at 6, and anxiety at 6.  Maintained on routine safety checks.  Medications given as prescribed.  Support and encouragement offered as needed.  Attended group and participated. Will continue to monitor.

## 2017-12-14 NOTE — H&P (Signed)
Psychiatric Admission Assessment Adult  Patient Identification: Valentine I Kerwin  MRN:  [id number]  Date of Evaluation:  12/14/2017  Chief Complaint: Worsening psychosis & paranoia.   Principal Diagnosis: Paranoid schizophrenia (HCC)  Diagnosis:   Patient Active Problem List   Diagnosis Date Noted  . Paranoid schizophrenia (HCC) [F20.0] 06/19/2017    Priority: High  . Schizophrenia, paranoid type (HCC) [F20.0] 12/13/2017  . 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 Gastroesophageal reflux disease with esophagitis [K21.0]   . 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 Cannabis abuse with psychotic disorder (HCC) [F12.159] 12/07/2016  . Pericarditis [I31.9] 12/06/2016  . Rhabdomyolysis [M62.82] 12/06/2016  . EtOH dependence (HCC) [F10.20] 12/06/2016  . Transient alteration of awareness [R40.4] 08/28/2016  . Jerky body movements [R25.8] 08/28/2016  . Seizures (HCC) [R56.9] 06/18/2016  . Internal hemorrhoids [K64.8] 09/30/2013  . Acute psychosis (HCC) [F23] 05/29/2006  . TOBACCO DEPENDENCE [F17.200] 05/29/2006   History of Present Illness: (Per Md's admission SRA):Patient is a 42-year-old male with a past psychiatric history significant for chronic paranoid schizophrenia, seizure disorder, migraine headaches, hypertension, cannabis use disorder as well as alcohol use disorder.  He presented to the [facility] emergency department on 12/13/2017.  The patient presented there because he was having abdominal pain and his fear for constipation.  He has not been able to go to the bathroom for several days, and this caused him to be suicidal.  He is also significantly paranoid, and felt as though people were out to get him.  He stated he was trying to leave the area over the last 3 months.  He  had been going to [facility] for outpatient treatment, and had been prescribed Depakote as well as olanzapine.  He stated that he had run out of his medications.  He stated he had missed his last appointment.  He was admitted to the hospital for evaluation and stabilization.   Associated Signs/Symptoms:  Depression Symptoms:  depressed mood, insomnia, psychomotor agitation, anxiety,  (Hypo) Manic Symptoms:  Hallucinations, Labiality of Mood,  Anxiety Symptoms:  Excessive Worry,  Psychotic Symptoms:  Hallucinations: Auditory Visual Paranoia,  PTSD Symptoms: NA  Total Time spent with patient: 15 minutes  Past Psychiatric History: Paranoid Schizophrenia.  Is the patient at risk to self? No.  Has the patient been a risk to self in the past 6 months? No.  Has the patient been a risk to self within the distant past? Yes.   (patient says he had attempted suicide in the past, but unable to elaborate). Is the patient a risk to others? No.  Has the patient been a risk to others in the past 6 months? No.  Has the patient been a risk to others within the distant past? No.   Prior Inpatient Therapy: Yes Prior Outpatient Therapy: Yes ([facility]).   Alcohol Screening: 1. How often do you have a drink containing alcohol?: Never 2. How many drinks containing alcohol do you have on a typical day when you are drinking?: 1 or 2 3. How often do you have six or more drinks on one occasion?: Never AUDIT-C Score: 0 4. How often during the last year have you found that you were not able to stop drinking once you had started?: Never 5. How often during the last year have you failed to do what was normally expected from you becasue of drinking?: Never 6. How   often during the last year have you needed a first drink in the morning to get yourself going after a heavy drinking session?: Never 7. How often during the last year have you had a feeling of guilt of remorse after drinking?: Never 8. How often  during the last year have you been unable to remember what happened the night before because you had been drinking?: Never 9. Have you or someone else been injured as a result of your drinking?: No 10. Has a relative or friend or a doctor or another health worker been concerned about your drinking or suggested you cut down?: No Alcohol Use Disorder Identification Test Final Score (AUDIT): 0 Intervention/Follow-up: AUDIT Score <7 follow-up not indicated  Substance Abuse History in the last 12 months:  Yes.    Consequences of Substance Abuse: Medical Consequences:  Liver damage, Possible death by overdose Legal Consequences:  Arrests, jail time, Loss of driving privilege. Family Consequences:  Family discord, divorce and or separation.  Previous Psychotropic Medications: Yes (Zyprexa)  Psychological Evaluations: No   Past Medical History:  Past Medical History:  Diagnosis Date  . Alcohol abuse   . Depression   . Hypertension   . Migraines   . Schizophrenia (HCC)   . Seizure (HCC)    alcoho    Past Surgical History:  Procedure Laterality Date  . WISDOM TOOTH EXTRACTION     Family History:  Family History  Problem Relation Age of Onset  . Diabetes Mother   . Hypertension Mother   . Hyperlipidemia Mother   . Anemia Mother   . Heart murmur Mother   . Colon polyps Maternal Uncle   . Heart disease Maternal Uncle   . Celiac disease Maternal Grandmother   . Diabetes Maternal Grandmother   . Diabetes Maternal Grandfather    Family Psychiatric  History: None reported.  Tobacco Screening: Have you used any form of tobacco in the last 30 days? (Cigarettes, Smokeless Tobacco, Cigars, and/or Pipes): Yes Tobacco use, Select all that apply: 5 or more cigarettes per day Are you interested in Tobacco Cessation Medications?: No, patient refused Counseled patient on smoking cessation including recognizing danger situations, developing coping skills and basic information about quitting  provided: Refused/Declined practical counseling  Social History: Single  Social History   Substance and Sexual Activity  Alcohol Use Yes     Social History   Substance and Sexual Activity  Drug Use Yes  . Types: Marijuana    Additional Social History:  Pain Medications: none Prescriptions: none Over the Counter: none History of alcohol / drug use?: Yes  Allergies:  No Known Allergies  Lab Results:  Results for orders placed or performed during the hospital encounter of 12/12/17 (from the past 48 hour(s))  Comprehensive metabolic panel     Status: Abnormal   Collection Time: 12/12/17  5:48 PM  Result Value Ref Range   Sodium 137 135 - 145 mmol/L   Potassium 3.6 3.5 - 5.1 mmol/L   Chloride 102 98 - 111 mmol/L   CO2 25 22 - 32 mmol/L   Glucose, Bld 200 (H) 70 - 99 mg/dL   BUN 10 6 - 20 mg/dL   Creatinine, Ser 1.22 0.61 - 1.24 mg/dL   Calcium 8.8 (L) 8.9 - 10.3 mg/dL   Total Protein 7.5 6.5 - 8.1 g/dL   Albumin 4.1 3.5 - 5.0 g/dL   AST 148 (H) 15 - 41 U/L   ALT 53 (H) 0 - 44 U/L   Alkaline   Phosphatase 52 38 - 126 U/L   Total Bilirubin 0.8 0.3 - 1.2 mg/dL   GFR calc non Af Amer >60 >60 mL/min   GFR calc Af Amer >60 >60 mL/min    Comment: (NOTE) The eGFR has been calculated using the CKD EPI equation. This calculation has not been validated in all clinical situations. eGFR's persistently <60 mL/min signify possible Chronic Kidney Disease.    Anion gap 10 5 - 15    Comment: Performed at [facility], [address] [place], [place] [address]  Ethanol     Status: None   Collection Time: 12/12/17  5:48 PM  Result Value Ref Range   Alcohol, Ethyl (B) <10 <10 mg/dL    Comment: (NOTE) Lowest detectable limit for serum alcohol is 10 mg/dL. For medical purposes only. Performed at [facility], [address] [place], [place] [address]   Salicylate level     Status: None   Collection Time: 12/12/17  5:48 PM  Result Value Ref  Range   Salicylate Lvl <7.0 2.8 - 30.0 mg/dL    Comment: Performed at [facility], [address] [place], [place] [address]  Acetaminophen level     Status: Abnormal   Collection Time: 12/12/17  5:48 PM  Result Value Ref Range   Acetaminophen (Tylenol), Serum <10 (L) 10 - 30 ug/mL    Comment: (NOTE) Therapeutic concentrations vary significantly. A range of 10-30 ug/mL  may be an effective concentration for many patients. However, some  are best treated at concentrations outside of this range. Acetaminophen concentrations >150 ug/mL at 4 hours after ingestion  and >50 ug/mL at 12 hours after ingestion are often associated with  toxic reactions. Performed at [facility], [address] [place], [place] [address]   cbc     Status: Abnormal   Collection Time: 12/12/17  5:48 PM  Result Value Ref Range   WBC 8.0 4.0 - 10.5 K/uL   RBC 4.36 4.22 - 5.81 MIL/uL   Hemoglobin 12.8 (L) 13.0 - 17.0 g/dL   HCT 39.0 39.0 - 52.0 %   MCV 89.4 78.0 - 100.0 fL   MCH 29.4 26.0 - 34.0 pg   MCHC 32.8 30.0 - 36.0 g/dL   RDW 13.0 11.5 - 15.5 %   Platelets 210 150 - 400 K/uL    Comment: Performed at [facility], [address] [place], [place] [address]  CK     Status: Abnormal   Collection Time: 12/12/17  5:48 PM  Result Value Ref Range   Total CK 4,370 (H) 49 - 397 U/L    Comment: RESULTS CONFIRMED BY MANUAL DILUTION Performed at [facility], [address] [place], [place] [address]   Rapid urine drug screen (hospital performed)     Status: Abnormal   Collection Time: 12/12/17  8:45 PM  Result Value Ref Range   Opiates NONE DETECTED NONE DETECTED   Cocaine NONE DETECTED NONE DETECTED   Benzodiazepines NONE DETECTED NONE DETECTED   Amphetamines NONE DETECTED NONE DETECTED   Tetrahydrocannabinol POSITIVE (A) NONE DETECTED   Barbiturates NONE DETECTED NONE DETECTED    Comment: (NOTE) DRUG SCREEN FOR MEDICAL  PURPOSES ONLY.  IF CONFIRMATION IS NEEDED FOR ANY PURPOSE, NOTIFY LAB WITHIN 5 DAYS. LOWEST DETECTABLE LIMITS FOR URINE DRUG SCREEN Drug Class                     Cutoff (ng/mL)   Amphetamine and metabolites    1000 Barbiturate and metabolites    200 Benzodiazepine                 200 Tricyclics and metabolites     300 Opiates and metabolites        300 Cocaine and metabolites        300 THC                            50 Performed at [facility], [address] [place], [place] [address]   CK total and CKMB (cardiac)not at [facility]     Status: Abnormal   Collection Time: 12/13/17 12:49 PM  Result Value Ref Range   Total CK 3,503 (H) 49 - 397 U/L   CK, MB 13.7 (H) 0.5 - 5.0 ng/mL   Relative Index 0.4 0.0 - 2.5    Comment: Performed at [facility], [address] [place], [place] [address]   Blood Alcohol level:  Lab Results  Component Value Date   ETH <10 12/12/2017   ETH <10 12/10/2017   Metabolic Disorder Labs:  Lab Results  Component Value Date   HGBA1C 5.9 06/19/2017   No results found for: PROLACTIN Lab Results  Component Value Date   CHOL 185 06/19/2017   TRIG 330.0 (H) 06/19/2017   HDL 48.40 06/19/2017   CHOLHDL 4 06/19/2017   VLDL 66.0 (H) 06/19/2017   LDLCALC 92 06/14/2016   Current Medications: Current Facility-Administered Medications  Medication Dose Route Frequency Provider Last Rate Last Dose  . acetaminophen (TYLENOL) tablet 650 mg  650 mg Oral Q6H PRN Parks, Laurie Britton, NP      . alum & mag hydroxide-simeth (MAALOX/MYLANTA) 200-200-20 MG/5ML suspension 30 mL  30 mL Oral Q4H PRN Parks, Laurie Britton, NP      . divalproex (DEPAKOTE ER) 24 hr tablet 500 mg  500 mg Oral QHS Clary, [place] Lawson, MD      . famotidine (PEPCID) tablet 20 mg  20 mg Oral Daily Parks, Laurie Britton, NP   20 mg at 12/14/17 0807  . hydrOXYzine (ATARAX/VISTARIL) tablet 25 mg  25 mg Oral TID PRN Parks, Laurie Britton, NP      . LORazepam (ATIVAN)  tablet 1 mg  1 mg Oral TID Parks, Laurie Britton, NP   1 mg at 12/14/17 1206  . magnesium hydroxide (MILK OF MAGNESIA) suspension 30 mL  30 mL Oral Daily PRN Parks, Laurie Britton, NP      . multivitamin with minerals tablet 1 tablet  1 tablet Oral Daily Parks, Laurie Britton, NP   1 tablet at 12/14/17 0807  . nicotine (NICODERM CQ - dosed in mg/24 hours) patch 21 mg  21 mg Transdermal Daily Parks, Laurie Britton, NP   21 mg at 12/14/17 0807  . OLANZapine zydis (ZYPREXA) disintegrating tablet 10 mg  10 mg Oral BID Clary, [place] Lawson, MD   10 mg at 12/14/17 1043  . pantoprazole (PROTONIX) EC tablet 40 mg  40 mg Oral Daily Clary, [place] Lawson, MD   40 mg at 12/14/17 1043  . polyethylene glycol (MIRALAX / GLYCOLAX) packet 17 g  17 g Oral Daily PRN Parks, Laurie Britton, NP      . polyethylene glycol (MIRALAX / GLYCOLAX) packet 17 g  17 g Oral Daily Clary, [place] Lawson, MD   17 g at 12/14/17 1046  . traZODone (DESYREL) tablet 50 mg  50   mg Oral QHS PRN Ethelene Hal, NP       PTA Medications: Medications Prior to Admission  Medication Sig Dispense Refill Last Dose  . acetaminophen (TYLENOL) 325 MG tablet Take 2 tablets (650 mg total) by mouth every 6 (six) hours as needed for mild pain, fever or headache. (Patient not taking: Reported on 12/12/2017) 40 tablet 0 Unknown at Unknown time  . divalproex (DEPAKOTE ER) 500 MG 24 hr tablet Take 2 tablets (1,000 mg total) by mouth at bedtime. 60 tablet 11 Unknown at Unknown time  . Multiple Vitamin (MULTIVITAMIN WITH MINERALS) TABS tablet Take 1 tablet by mouth daily. 30 tablet 3 Unknown at Unknown time  . nicotine (NICODERM CQ - DOSED IN MG/24 HOURS) 21 mg/24hr patch Place 1 patch (21 mg total) onto the skin daily. 28 patch 0 Unknown at Unknown time  . OLANZapine (ZYPREXA) 10 MG tablet Take 1 tablet (10 mg total) by mouth 2 (two) times daily. 60 tablet 1 Unknown at Unknown time  . omeprazole (PRILOSEC) 40 MG capsule Take 1 capsule (40 mg total) by mouth  daily. (Patient not taking: Reported on 12/12/2017) 30 capsule 2 Unknown at Unknown time  . omeprazole (PRILOSEC) 40 MG capsule TAKE 1 CAPSULE BY MOUTH EVERY DAY (Patient not taking: Reported on 12/12/2017) 90 capsule 1 Unknown at Unknown time  . polyethylene glycol (MIRALAX / GLYCOLAX) packet Take 17 g by mouth daily as needed for moderate constipation. 28 each 0 Unknown at Unknown time  . ranitidine (ZANTAC) 150 MG tablet TAKE 1 TABLET BY MOUTH AT BEDTIME (Patient not taking: Reported on 12/12/2017) 90 tablet 3 Unknown at Unknown time   Musculoskeletal: Strength & Muscle Tone: within normal limits Gait & Station: normal Patient leans: N/A  Psychiatric Specialty Exam: Physical Exam  Nursing note and vitals reviewed. Constitutional: He appears well-developed.  HENT:  Head: Normocephalic.  Eyes: Pupils are equal, round, and reactive to light.  Neck: Normal range of motion.  Cardiovascular: Normal rate.  Hx. HTN.    Respiratory: Effort normal.  GI: Soft.  Genitourinary:  Genitourinary Comments: Deferred  Musculoskeletal: Normal range of motion.  Neurological: He is alert.  Skin: Skin is warm.    Review of Systems  Constitutional: Negative.   HENT: Negative.   Eyes: Negative.   Respiratory: Negative.  Negative for cough and shortness of breath.   Cardiovascular: Negative.  Negative for chest pain and palpitations.       HX. HTN  Gastrointestinal: Positive for constipation (Chronic). Abdominal pain: Hx. Abdominal pain.  Genitourinary: Negative.  Urgency: Yes.  Musculoskeletal: Negative.   Skin: Negative.   Neurological: Seizures: Hx. Seizures activities.  Endo/Heme/Allergies: Negative.   Psychiatric/Behavioral: Positive for depression, hallucinations (AH, paranoia), substance abuse (UDS positive for THC) and suicidal ideas. Negative for memory loss. The patient is nervous/anxious and has insomnia.     Blood pressure 121/85, pulse 80, temperature 98 F (36.7 C), temperature  source Oral, resp. rate 18, height 6' 4" (1.93 m), weight 90.7 kg.Body mass index is 24.34 kg/m.  General Appearance: Disheveled  Eye Contact:  Fair  Speech:  Normal Rate  Volume:  Normal  Mood:  Dysphoric  Affect:  Flat  Thought Process:  Coherent and Descriptions of Associations: Loose  Orientation:  Full (Time, Place, and Person)  Thought Content:  Delusions and Hallucinations: Auditory  Suicidal Thoughts:  Yes.  without intent/plan  Homicidal Thoughts:  No  Memory:  Immediate;   Poor Recent;   Poor Remote;   Poor  Judgement:  Impaired  Insight:  Lacking  Psychomotor Activity:  Increased  Concentration:  Concentration: Fair and Attention Span: Fair  Recall:  Fair  Fund of Knowledge:  Fair  Language:  Fair  Akathisia:  Negative  Handed:  Right  AIMS (if indicated):     Assets:  Desire for Improvement Physical Health Resilience  ADL's:  Intact  Cognition:  WNL  Sleep:  Number of Hours: 6.25     Treatment Plan/Recommendations: 1. Admit for crisis management and stabilization, estimated length of stay 3-5 days.   2. Medication management to reduce current symptoms to base line and improve the patient's overall level of functioning: See MAR, Md's SRA & treatment plan.   Observation Level/Precautions:  15 minute checks  Laboratory:  Per ED, UDS (+) for THC  Psychotherapy: Group sessions  Medications: See MAR  Consultations: As needed   Discharge Concerns: Safety, mood stability   Estimated LOS: 5-7 days  Other: Admit to the [facility].    Physician Treatment Plan for Primary Diagnosis: Paranoid schizophrenia (HCC)  Long Term Goal(s): Improvement in symptoms so as ready for discharge  Short Term Goals: Ability to identify changes in lifestyle to reduce recurrence of condition will improve, Ability to disclose and discuss suicidal ideas and Ability to demonstrate self-control will improve  Physician Treatment Plan for Secondary Diagnosis: Principal Problem:   Paranoid  schizophrenia (HCC) Active Problems:   Schizophrenia, paranoid type (HCC)  Long Term Goal(s): Improvement in symptoms so as ready for discharge  Short Term Goals: Ability to identify and develop effective coping behaviors will improve, Compliance with prescribed medications will improve and Ability to identify triggers associated with substance abuse/mental health issues will improve  I certify that inpatient services furnished can reasonably be expected to improve the patient's condition.     , NP, PMHNP, FNP-BC 9/15/201912:32 PM 

## 2017-12-14 NOTE — Progress Notes (Signed)
Did not attend group 

## 2017-12-14 NOTE — [facility] Group Notes (Signed)
[facility]  Date/Time:  12/14/2017  11:00AM-12:00PM  Type of Therapy and Topic:  Group Therapy:  Music and Mood  Participation Level:  Active   Description of Group: In this process group, members listened to a variety of genres of music and identified that different types of music evoke different responses.  Patients were encouraged to identify music that was soothing for them and music that was energizing for them.  Patients discussed how this knowledge can help with wellness and recovery in various ways including managing depression and anxiety as well as encouraging healthy sleep habits.    Therapeutic Goals: 1. Patients will explore the impact of different varieties of music on mood 2. Patients will verbalize the thoughts they have when listening to different types of music 3. Patients will identify music that is soothing to them as well as music that is energizing to them 4. Patients will discuss how to use this knowledge to assist in maintaining wellness and recovery 5. Patients will explore the use of music as a coping skill  Summary of Patient Progress:  At the beginning of group, patient expressed he did not feel well and at the end he said he felt more relaxed.  Therapeutic Modalities: Solution Focused Brief Therapy Activity   Ambrose MantleMareida Grossman-Orr, LCSW

## 2017-12-14 NOTE — [facility] Group Notes (Signed)
[facility] Group Notes:  (Nursing/MHT/Case Management/Adjunct)  Date:  12/14/2017  Time:  11:26 AM  Type of Therapy:  Psychoeducational Skills  Participation Level:  Active  Participation Quality:  Appropriate  Affect:  Appropriate  Cognitive:  Appropriate  Insight:  Appropriate  Engagement in Group:  Engaged  Modes of Intervention:  Problem-solving  Summary of Progress/Problems: Pt attended Psychoeducational group with topic being healthy support systems.   Jacquelyne BalintForrest, Jaid Quirion Shanta 12/14/2017, 11:26 AM

## 2017-12-14 NOTE — [facility] Suicide Risk Assessment (Signed)
[facility]   Nursing information obtained from:  Patient Demographic factors:  Male, Low socioeconomic status Current Mental Status:  NA Loss Factors:  NA Historical Factors:  NA Risk Reduction Factors:  Sense of responsibility to family  Total Time spent with patient: 30 minutes Principal Problem: <principal problem not specified> Diagnosis:   Patient Active Problem List   Diagnosis Date Noted  . Schizophrenia, paranoid type (HCC) [F20.0] 12/13/2017  . 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 Paranoid schizophrenia (HCC) [F20.0] 06/19/2017  . Gastroesophageal reflux disease with esophagitis [K21.0]   . 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 Cannabis abuse with psychotic disorder (HCC) [F12.159] 12/07/2016  . Pericarditis [I31.9] 12/06/2016  . Rhabdomyolysis [M62.82] 12/06/2016  . EtOH dependence (HCC) [F10.20] 12/06/2016  . Transient alteration of awareness [R40.4] 08/28/2016  . Jerky body movements [R25.8] 08/28/2016  . Seizures (HCC) [R56.9] 06/18/2016  . Internal hemorrhoids [K64.8] 09/30/2013  . Acute psychosis (HCC) [F23] 05/29/2006  . TOBACCO DEPENDENCE [F17.200] 05/29/2006   Subjective Data: Patient is seen and examined.  Patient is a 42 year old male with a past psychiatric history significant for chronic paranoid schizophrenia, seizure disorder, migraine headaches, hypertension, cannabis use disorder as well as alcohol use disorder.  [facility].  The patient presented there because he was having abdominal pain and his fear for constipation.  He has not been able to go to the bathroom for several days, and this caused him to be suicidal.  He is also significantly paranoid, and felt as  though people were out to get him.  He stated he was trying to leave the area over the last 3 months.  [facility], and had been prescribed Depakote as well as olanzapine.  He stated that he had run out of his medications.  He stated he had missed his last appointment.  He was admitted to the hospital for evaluation and stabilization.  Continued Clinical Symptoms:  Alcohol Use Disorder Identification Test Final Score (AUDIT): 0 The "Alcohol Use Disorders Identification Test", Guidelines for Use in Primary Care, Second Edition.  World Science writerHealth Organization [facility](WHO). Score between 0-7:  no or low risk or alcohol related problems. Score between 8-15:  moderate risk of alcohol related problems. Score between 16-19:  high risk of alcohol related problems. Score 20 or above:  warrants further diagnostic evaluation for alcohol dependence and treatment.   CLINICAL FACTORS:   Alcohol/Substance Abuse/Dependencies Schizophrenia:   Paranoid or undifferentiated type   Musculoskeletal: Strength & Muscle Tone: within normal limits Gait & Station: normal Patient leans: N/A  Psychiatric Specialty Exam: Physical Exam  Constitutional: He is oriented to person, place, and time. He appears well-developed and well-nourished.  HENT:  Head: Normocephalic and atraumatic.  Respiratory: Effort normal.  Neurological: He is alert and oriented to person, place, and time.    ROS  Blood pressure 117/71, pulse 73, temperature 98.9 F (37.2 C), temperature source Oral, resp. rate 18, height 6\' 4"  (1.93 m), weight 90.7 kg.Body mass index is 24.34 kg/m.  General Appearance: Disheveled  Eye Contact:  Fair  Speech:  Normal Rate  Volume:  Normal  Mood:  Dysphoric  Affect:  Flat  Thought Process:  Coherent and Descriptions of Associations: Loose  Orientation:  Full (Time, Place, and Person)  Thought Content:  Delusions  and Hallucinations: Auditory  Suicidal Thoughts:  Yes.   without intent/plan  Homicidal Thoughts:  No  Memory:  Immediate;   Poor Recent;   Poor Remote;   Poor  Judgement:  Impaired  Insight:  Lacking  Psychomotor Activity:  Increased  Concentration:  Concentration: Fair and Attention Span: Fair  Recall:  [employer] of Knowledge:  Fair  Language:  Fair  Akathisia:  Negative  Handed:  Right  AIMS (if indicated):     Assets:  Desire for Improvement Physical Health Resilience  ADL's:  Intact  Cognition:  WNL  Sleep:  Number of Hours: 6.25      COGNITIVE FEATURES THAT CONTRIBUTE TO RISK:  None    SUICIDE RISK:   Moderate:  Frequent suicidal ideation with limited intensity, and duration, some specificity in terms of plans, no associated intent, good self-control, limited dysphoria/symptomatology, some risk factors present, and identifiable protective factors, including available and accessible social support.  PLAN OF CARE: Patient is seen and examined.  Patient is a 42 year old male with a past psychiatric history significant for chronic paranoid schizophrenia.  The patient has been noncompliant with medications, and also stated that he is been using alcohol as well as marijuana.  His blood alcohol on admission was negative.  His marijuana was positive.  He is ruminating about being constipated.  He had a CT scan of his abdomen done that was essentially normal.  He had been seen in the emergency room 2 days earlier for nontraumatic rhabdomyolysis.  His creatinine today is 1.22.  His AST is elevated at 148, and his ALT is elevated at 53.  His CK was greater than 4002 days ago, and is down to 3503.  His urine protein was negative.  A Depakote level was not done in the emergency department.  This will need to be done today, especially given his elevated liver function enzymes.  Several months ago his AST was 219, and his ALT was 111.  On admission his AST is 148, and his ALT is 53.  I am going to go on and restart his Depakote given his seizure  disorder.  We will restart his Zyprexa 10 mg twice daily.  We will place him on seizure precautions.  He will be monitored for bowel movements as well.  His creatinine and liver function enzymes will be followed.  His CK will be monitored.  I certify that inpatient services furnished can reasonably be expected to improve the patient's condition.   Antonieta Pert, MD 12/14/2017, 9:12 AM
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
## 2017-12-14 NOTE — [facility] Group Notes (Signed)
[facility] Group Notes:  (Nursing/MHT/Case Management/Adjunct)  Date:  12/14/2017  Time:  9:21 AM  Type of Therapy:  Patient self inventory group and goals group  Participation Level:  Active  Participation Quality:  Inattentive  Affect:  Anxious and Resistant  Cognitive:  Disorganized  Insight:  Lacking  Engagement in Group:  Lacking  Modes of Intervention:  Education  Summary of Progress/Problems: Pt did attend patient self inventory group and goals group.   Jacquelyne BalintForrest, Jamaiyah Pyle Shanta 12/14/2017, 9:21 AM

## 2017-12-15 DIAGNOSIS — K219 Gastro-esophageal reflux disease without esophagitis: Secondary | ICD-10-CM

## 2017-12-15 DIAGNOSIS — G4700 Insomnia, unspecified: Secondary | ICD-10-CM

## 2017-12-15 NOTE — Progress Notes (Signed)
Patient ID: Alfred [name], male   DOB: [birth year], 42 y.o.   MRN: [id number] D: Patient in his room all evening. Pt called for medication which was administered without incident. Offered snack but pt refused. Pt mood and affect appears depressed and flat. Pt appears paranoid but denies SI/HI/AVH and pain. No acute distress noted. A: Support and encouragement offered as needed to express needs. Medications administered as prescribed.  R: Patient is safe and cooperative on unit. Will continue to monitor  for safety and stability.

## 2017-12-15 NOTE — [facility] Group Notes (Signed)
LCSW Group Therapy Note   12/15/2017 1:15pm   Type of Therapy and Topic:  Group Therapy:  Overcoming Obstacles   Participation Level:  Active   Description of Group:    In this group patients will be encouraged to explore what they see as obstacles to their own wellness and recovery. They will be guided to discuss their thoughts, feelings, and behaviors related to these obstacles. The group will process together ways to cope with barriers, with attention given to specific choices patients can make. Each patient will be challenged to identify changes they are motivated to make in order to overcome their obstacles. This group will be process-oriented, with patients participating in exploration of their own experiences as well as giving and receiving support and challenge from other group members.   Therapeutic Goals: 1. Patient will identify personal and current obstacles as they relate to admission. 2. Patient will identify barriers that currently interfere with their wellness or overcoming obstacles.  3. Patient will identify feelings, thought process and behaviors related to these barriers. 4. Patient will identify two changes they are willing to make to overcome these obstacles:      Summary of Patient Progress   Stayed the entire time, engaged throughout.  Rather vague, difficult to pin down in his answers. "My current state is my biggest obstacle."  Went on to say that his pride gets in the way, and he needs to take the high road and listen to others.  Again, unclear as to how this impacts his immediate situation, but he did admit to not taking medications regularly.   Therapeutic Modalities:   Cognitive Behavioral Therapy Solution Focused Therapy Motivational Interviewing Relapse Prevention Therapy  Alfred RogueRodney B Ewart Carrera, LCSW 12/15/2017 4:02 PM

## 2017-12-15 NOTE — Progress Notes (Signed)
Recreation Therapy Notes  Date: 9.16.19 Time: 1000 Location: [address] Dayroom  Group Topic: Wellness  Goal Area(s) Addresses:  Patient will define components of whole wellness. Patient will verbalize benefit of whole wellness.  Behavioral Response: Minimal  Intervention:  Exercise, Music  Activity: Exercise.  LRT lead the group through a series of stretches.  Each group member would then lead the group in an exercise of their choice.  Patients and LRT went through a number of exercises and dance moves to get the heart pumping and blood flowing.  Education: Wellness, Building control surveyorDischarge Planning.   Education Outcome: Acknowledges education/In group clarification offered/Needs additional education.   Clinical Observations/Feedback:  Pt arrived to group a little late.  Pt completed some of the exercises.  Half way through, pt watched as his peers completed the remainder of the exercises.  Pt sat quietly and drank his coffee.   Caroll RancherMarjette Berle Fitz, LRT/CTRS         Caroll RancherLindsay, Susen Haskew A 12/15/2017 12:46 PM

## 2017-12-15 NOTE — Tx Team (Signed)
Interdisciplinary Treatment and Diagnostic Plan Update  12/15/2017 Time of Session: 8:42 AM  Alfred [name] MRN: [id number]  Principal Diagnosis: Paranoid schizophrenia ([place])  Secondary Diagnoses: Principal Problem:   Paranoid schizophrenia ([place]) Active Problems:   Schizophrenia, paranoid type ([place])   Current Medications:  Current Facility-Administered Medications  Medication Dose Route Frequency Provider Last Rate Last Dose  . acetaminophen (TYLENOL) tablet 650 mg  650 mg Oral Q6H PRN Ethelene Hal, NP      . alum & mag hydroxide-simeth (MAALOX/MYLANTA) 200-200-20 MG/5ML suspension 30 mL  30 mL Oral Q4H PRN Ethelene Hal, NP      . divalproex (DEPAKOTE ER) 24 hr tablet 500 mg  500 mg Oral QHS Sharma Covert, MD   500 mg at 12/14/17 2135  . famotidine (PEPCID) tablet 20 mg  20 mg Oral Daily Ethelene Hal, NP   20 mg at 12/15/17 0805  . hydrOXYzine (ATARAX/VISTARIL) tablet 25 mg  25 mg Oral TID PRN Ethelene Hal, NP      . LORazepam (ATIVAN) tablet 1 mg  1 mg Oral TID Ethelene Hal, NP   1 mg at 12/15/17 0806  . magnesium hydroxide (MILK OF MAGNESIA) suspension 30 mL  30 mL Oral Daily PRN Ethelene Hal, NP      . multivitamin with minerals tablet 1 tablet  1 tablet Oral Daily Ethelene Hal, NP   1 tablet at 12/15/17 0805  . nicotine (NICODERM CQ - dosed in mg/24 hours) patch 21 mg  21 mg Transdermal Daily Ethelene Hal, NP   21 mg at 12/15/17 0804  . OLANZapine zydis (ZYPREXA) disintegrating tablet 10 mg  10 mg Oral BID Sharma Covert, MD   10 mg at 12/15/17 0804  . pantoprazole (PROTONIX) EC tablet 40 mg  40 mg Oral Daily Sharma Covert, MD   40 mg at 12/15/17 0803  . polyethylene glycol (MIRALAX / GLYCOLAX) packet 17 g  17 g Oral Daily PRN Ethelene Hal, NP      . polyethylene glycol (MIRALAX / GLYCOLAX) packet 17 g  17 g Oral Daily Sharma Covert, MD   17 g at 12/15/17 0804  . traZODone (DESYREL)  tablet 50 mg  50 mg Oral QHS PRN Ethelene Hal, NP   50 mg at 12/14/17 2135    PTA Medications: Medications Prior to Admission  Medication Sig Dispense Refill Last Dose  . acetaminophen (TYLENOL) 325 MG tablet Take 2 tablets (650 mg total) by mouth every 6 (six) hours as needed for mild pain, fever or headache. (Patient not taking: Reported on 12/12/2017) 40 tablet 0 Unknown at Unknown time  . divalproex (DEPAKOTE ER) 500 MG 24 hr tablet Take 2 tablets (1,000 mg total) by mouth at bedtime. 60 tablet 11 Unknown at Unknown time  . Multiple Vitamin (MULTIVITAMIN WITH MINERALS) TABS tablet Take 1 tablet by mouth daily. 30 tablet 3 Unknown at Unknown time  . nicotine (NICODERM CQ - DOSED IN MG/24 HOURS) 21 mg/24hr patch Place 1 patch (21 mg total) onto the skin daily. 28 patch 0 Unknown at Unknown time  . OLANZapine (ZYPREXA) 10 MG tablet Take 1 tablet (10 mg total) by mouth 2 (two) times daily. 60 tablet 1 Unknown at Unknown time  . omeprazole (PRILOSEC) 40 MG capsule Take 1 capsule (40 mg total) by mouth daily. (Patient not taking: Reported on 12/12/2017) 30 capsule 2 Unknown at Unknown time  . omeprazole (PRILOSEC) 40 MG capsule TAKE  1 CAPSULE BY MOUTH EVERY DAY (Patient not taking: Reported on 12/12/2017) 90 capsule 1 Unknown at Unknown time  . polyethylene glycol (MIRALAX / GLYCOLAX) packet Take 17 g by mouth daily as needed for moderate constipation. 28 each 0 Unknown at Unknown time  . ranitidine (ZANTAC) 150 MG tablet TAKE 1 TABLET BY MOUTH AT BEDTIME (Patient not taking: Reported on 12/12/2017) 90 tablet 3 Unknown at Unknown time    Patient Stressors: Financial difficulties Medication change or noncompliance  Patient Strengths: Ability for insight Motivation for treatment/growth  Treatment Modalities: Medication Management, Group therapy, Case management,  1 to 1 session with clinician, Psychoeducation, Recreational therapy.   Physician Treatment Plan for Primary Diagnosis:  Paranoid schizophrenia ([place]) Long Term Goal(s): Improvement in symptoms so as ready for discharge  Short Term Goals: Ability to identify changes in lifestyle to reduce recurrence of condition will improve Ability to disclose and discuss suicidal ideas Ability to demonstrate self-control will improve Ability to identify and develop effective coping behaviors will improve Compliance with prescribed medications will improve Ability to identify triggers associated with substance abuse/mental health issues will improve  Medication Management: Evaluate patient's response, side effects, and tolerance of medication regimen.  Therapeutic Interventions: 1 to 1 sessions, Unit Group sessions and Medication administration.  Evaluation of Outcomes: Progressing  Physician Treatment Plan for Secondary Diagnosis: Principal Problem:   Paranoid schizophrenia ([place]) Active Problems:   Schizophrenia, paranoid type ([place])   Long Term Goal(s): Improvement in symptoms so as ready for discharge  Short Term Goals: Ability to identify changes in lifestyle to reduce recurrence of condition will improve Ability to disclose and discuss suicidal ideas Ability to demonstrate self-control will improve Ability to identify and develop effective coping behaviors will improve Compliance with prescribed medications will improve Ability to identify triggers associated with substance abuse/mental health issues will improve  Medication Management: Evaluate patient's response, side effects, and tolerance of medication regimen.  Therapeutic Interventions: 1 to 1 sessions, Unit Group sessions and Medication administration.  Evaluation of Outcomes: Progressing   RN Treatment Plan for Primary Diagnosis: Paranoid schizophrenia ([place]) Long Term Goal(s): Knowledge of disease and therapeutic regimen to maintain health will improve  Short Term Goals: Ability to identify and develop effective coping behaviors will improve and  Compliance with prescribed medications will improve  Medication Management: RN will administer medications as ordered by provider, will assess and evaluate patient's response and provide education to patient for prescribed medication. RN will report any adverse and/or side effects to prescribing provider.  Therapeutic Interventions: 1 on 1 counseling sessions, Psychoeducation, Medication administration, Evaluate responses to treatment, Monitor vital signs and CBGs as ordered, Perform/monitor CIWA, COWS, AIMS and Fall Risk screenings as ordered, Perform wound care treatments as ordered.  Evaluation of Outcomes: Progressing   LCSW Treatment Plan for Primary Diagnosis: Paranoid schizophrenia ([place]) Long Term Goal(s): Safe transition to appropriate next level of care at discharge, Engage patient in therapeutic group addressing interpersonal concerns.  Short Term Goals: Engage patient in aftercare planning with referrals and resources  Therapeutic Interventions: Assess for all discharge needs, 1 to 1 time with Social worker, Explore available resources and support systems, Assess for adequacy in community support network, Educate family and significant other(s) on suicide prevention, Complete Psychosocial Assessment, Interpersonal group therapy.  Evaluation of Outcomes: Met  Return home, follow up outpt   Progress in Treatment: Attending groups: Yes Participating in groups: Yes Taking medication as prescribed: Yes Toleration medication: Yes, no side effects reported at this time Family/Significant other  contact made: No Patient understands diagnosis: Yes AEB asking for help with getting back on medication Discussing patient identified problems/goals with staff: Yes Medical problems stabilized or resolved: Yes Denies suicidal/homicidal ideation: Yes Issues/concerns per patient self-inventory: None Other: N/A  New problem(s) identified: None identified at this time.   New Short Term/Long  Term Goal(s): "I think I might need to find a way to relocate. I feel like everywhere I go people get hurt."   Discharge Plan or Barriers:   Reason for Continuation of Hospitalization: Paranoia SI Medication stabilization   Estimated Length of Stay: 9/20  Attendees: Patient:  Alfred [name] 12/15/2017  8:42 AM  Physician: Maris Berger, MD 12/15/2017  8:42 AM  Nursing: Sena Hitch, RN 12/15/2017  8:42 AM  RN Care Manager: Lars Pinks, RN 12/15/2017  8:42 AM  Social Worker: Ripley Fraise 12/15/2017  8:42 AM  Recreational Therapist: Winfield Cunas 12/15/2017  8:42 AM  Other: Norberto Sorenson 12/15/2017  8:42 AM  Other:  12/15/2017  8:42 AM    Scribe for Treatment Team:  Roque Lias LCSW 12/15/2017 8:42 AM

## 2017-12-15 NOTE — Progress Notes (Signed)
[name] Progress Note  12/15/2017 1:02 PM Alfred [name]  MRN:  [id number] Subjective:    (Per Md's admission SRA):Patient is a 42 year old male with a past psychiatric history significant for chronic paranoid schizophrenia, seizure disorder, migraine headaches, hypertension, cannabis use disorder as well as alcohol use disorder. He presented to the [facility] emergency department on 12/13/2017. The patient presented there because he was having abdominal pain and his fear for constipation. He has not been able to go to the bathroom for several days, and this caused him to be suicidal. He is also significantly paranoid, and felt as though people were out to get him. He stated he was trying to leave the area over the last 3 months. He had been going to [facility] for outpatient treatment, and had been prescribed Depakote as well as olanzapine. He stated that he had run out of his medications. He stated he had missed his last appointment. He was admitted to the hospital for evaluation and stabilization.   Today upon evaluation:  Pt shares, "I felt really unsafe. I was barricading myself in my place. I can't talk to no one. I can't see no one." Pt reports ongoing anxiety and paranoia, but he feels relieved by being in the hospital. He reports his symptoms are starting to diminish after being restarted on his previous medications. He denies SI/HI/AH/VH today. He is sleeping well. His appetite is good. He is in agreement to continue his current regimen without changes. He had no further questions, comments, or concerns.  Principal Problem: Paranoid schizophrenia ([place]) Diagnosis:   Patient Active Problem List   Diagnosis Date Noted  . Schizophrenia, paranoid type ([place]) [F20.0] 12/13/2017  . Localization-related idiopathic epilepsy and epileptic syndromes with seizures of localized onset, not intractable, without status epilepticus ([place]) [G40.009] 06/21/2017  . Paranoid  schizophrenia ([place]) [F20.0] 06/19/2017  . Gastroesophageal reflux disease with esophagitis [K21.0]   . Cocaine abuse w/cocaine-induced psychotic disorder w/hallucinations ([place]) [F14.151] 12/07/2016  . Cannabis abuse with psychotic disorder ([place]) [F12.159] 12/07/2016  . Pericarditis [I31.9] 12/06/2016  . Rhabdomyolysis [M62.82] 12/06/2016  . EtOH dependence ([place]) [F10.20] 12/06/2016  . Transient alteration of awareness [R40.4] 08/28/2016  . Jerky body movements [R25.8] 08/28/2016  . Seizures ([place]) [R56.9] 06/18/2016  . Internal hemorrhoids [K64.8] 09/30/2013  . Acute psychosis ([place]) [F23] 05/29/2006  . TOBACCO DEPENDENCE [F17.200] 05/29/2006   Total Time spent with patient: 30 minutes  Past Psychiatric History: see H&P  Past Medical History:  Past Medical History:  Diagnosis Date  . Alcohol abuse   . Depression   . Hypertension   . Migraines   . Schizophrenia ([place])   . Seizure ([place])    alcoho    Past Surgical History:  Procedure Laterality Date  . WISDOM TOOTH EXTRACTION     Family History:  Family History  Problem Relation Age of Onset  . Diabetes Mother   . Hypertension Mother   . Hyperlipidemia Mother   . Anemia Mother   . Heart murmur Mother   . Colon polyps Maternal Uncle   . Heart disease Maternal Uncle   . Celiac disease Maternal Grandmother   . Diabetes Maternal Grandmother   . Diabetes Maternal Grandfather    Family Psychiatric  History: see H&P Social History:  Social History   Substance and Sexual Activity  Alcohol Use Yes     Social History   Substance and Sexual Activity  Drug Use Yes  . Types: Marijuana    Social History  Socioeconomic History  . Marital status: Single    Spouse name: Not on file  . Number of children: 3  . Years of education: Not on file  . Highest education level: Not on file  Occupational History  . Occupation: Unemployed  Social Needs  . Financial resource strain: Not on file  . Food insecurity:    Worry: Not  on file    Inability: Not on file  . Transportation needs:    Medical: Not on file    Non-medical: Not on file  Tobacco Use  . Smoking status: Current Some Day Smoker    Packs/day: 1.00    Types: Cigarettes  . Smokeless tobacco: Never Used  Substance and Sexual Activity  . Alcohol use: Yes  . Drug use: Yes    Types: Marijuana  . Sexual activity: Not on file  Lifestyle  . Physical activity:    Days per week: Not on file    Minutes per session: Not on file  . Stress: Not on file  Relationships  . Social connections:    Talks on phone: Not on file    Gets together: Not on file    Attends religious service: Not on file    Active member of club or organization: Not on file    Attends meetings of clubs or organizations: Not on file    Relationship status: Not on file  Other Topics Concern  . Not on file  Social History Narrative  . Not on file   Additional Social History:    Pain Medications: none Prescriptions: none Over the Counter: none History of alcohol / drug use?: Yes                    Sleep: Good  Appetite:  Good  Current Medications: Current Facility-Administered Medications  Medication Dose Route Frequency Provider Last Rate Last Dose  . acetaminophen (TYLENOL) tablet 650 mg  650 mg Oral Q6H PRN Ethelene Hal, NP      . alum & mag hydroxide-simeth (MAALOX/MYLANTA) 200-200-20 MG/5ML suspension 30 mL  30 mL Oral Q4H PRN Ethelene Hal, NP      . divalproex (DEPAKOTE ER) 24 hr tablet 500 mg  500 mg Oral QHS Sharma Covert, MD   500 mg at 12/14/17 2135  . famotidine (PEPCID) tablet 20 mg  20 mg Oral Daily Ethelene Hal, NP   20 mg at 12/15/17 0805  . hydrOXYzine (ATARAX/VISTARIL) tablet 25 mg  25 mg Oral TID PRN Ethelene Hal, NP      . LORazepam (ATIVAN) tablet 1 mg  1 mg Oral TID Ethelene Hal, NP   1 mg at 12/15/17 0806  . magnesium hydroxide (MILK OF MAGNESIA) suspension 30 mL  30 mL Oral Daily PRN Ethelene Hal, NP      . multivitamin with minerals tablet 1 tablet  1 tablet Oral Daily Ethelene Hal, NP   1 tablet at 12/15/17 0805  . nicotine (NICODERM CQ - dosed in mg/24 hours) patch 21 mg  21 mg Transdermal Daily Ethelene Hal, NP   21 mg at 12/15/17 0804  . OLANZapine zydis (ZYPREXA) disintegrating tablet 10 mg  10 mg Oral BID Sharma Covert, MD   10 mg at 12/15/17 0804  . pantoprazole (PROTONIX) EC tablet 40 mg  40 mg Oral Daily Sharma Covert, MD   40 mg at 12/15/17 0803  . polyethylene glycol (MIRALAX / GLYCOLAX) packet 17 g  17 g Oral Daily PRN Ethelene Hal, NP      . polyethylene glycol (MIRALAX / GLYCOLAX) packet 17 g  17 g Oral Daily Sharma Covert, MD   17 g at 12/15/17 0804  . traZODone (DESYREL) tablet 50 mg  50 mg Oral QHS PRN Ethelene Hal, NP   50 mg at 12/14/17 2135    Lab Results:  Results for orders placed or performed during the hospital encounter of 12/13/17 (from the past 48 hour(s))  Valproic acid level     Status: Abnormal   Collection Time: 12/14/17  6:29 PM  Result Value Ref Range   Valproic Acid Lvl <10 (L) 50.0 - 100.0 ug/mL    Comment: Performed at [facility], [place] [address], [place], [place] [address]  CK     Status: Abnormal   Collection Time: 12/14/17  6:29 PM  Result Value Ref Range   Total CK 1,534 (H) 49 - 397 U/L    Comment: Performed at [facility], [place] [address] [place], [place] [address]  Comprehensive metabolic panel     Status: Abnormal   Collection Time: 12/14/17  6:29 PM  Result Value Ref Range   Sodium 141 135 - 145 mmol/L   Potassium 4.3 3.5 - 5.1 mmol/L   Chloride 106 98 - 111 mmol/L   CO2 27 22 - 32 mmol/L   Glucose, Bld 109 (H) 70 - 99 mg/dL   BUN 10 6 - 20 mg/dL   Creatinine, Ser 1.30 (H) 0.61 - 1.24 mg/dL   Calcium 8.8 (L) 8.9 - 10.3 mg/dL   Total Protein 6.2 (L) 6.5 - 8.1 g/dL   Albumin 3.4 (L) 3.5 - 5.0 g/dL   AST 72 (H) 15 - 41 U/L    ALT 39 0 - 44 U/L   Alkaline Phosphatase 44 38 - 126 U/L   Total Bilirubin 0.5 0.3 - 1.2 mg/dL   GFR calc non Af Amer >60 >60 mL/min   GFR calc Af Amer >60 >60 mL/min    Comment: (NOTE) The eGFR has been calculated using the CKD EPI equation. This calculation has not been validated in all clinical situations. eGFR's persistently <60 mL/min signify possible Chronic Kidney Disease.    Anion gap 8 5 - 15    Comment: Performed at [facility], [place] [address], [place], [place] [address]    Blood Alcohol level:  Lab Results  Component Value Date   ETH <10 12/12/2017   ETH <10 03/49/1791    Metabolic Disorder Labs: Lab Results  Component Value Date   HGBA1C 5.9 06/19/2017   No results found for: PROLACTIN Lab Results  Component Value Date   CHOL 185 06/19/2017   TRIG 330.0 (H) 06/19/2017   HDL 48.40 06/19/2017   CHOLHDL 4 06/19/2017   VLDL 66.0 (H) 06/19/2017   LDLCALC 92 06/14/2016    Physical Findings: AIMS:  , ,  ,  ,    CIWA:    COWS:     Musculoskeletal: Strength & Muscle Tone: within normal limits Gait & Station: normal Patient leans: N/A  Psychiatric Specialty Exam: Physical Exam  Nursing note and vitals reviewed.   Review of Systems  Constitutional: Negative for chills and fever.  Respiratory: Negative for cough and shortness of breath.   Cardiovascular: Negative for chest pain.  Gastrointestinal: Negative for abdominal pain, heartburn, nausea and vomiting.  Psychiatric/Behavioral: Negative for depression, hallucinations and suicidal ideas. The patient is not nervous/anxious  and does not have insomnia.     Blood pressure 122/82, pulse 82, temperature 98.6 F (37 C), temperature source Oral, resp. rate 18, height 6' 4"  (1.93 m), weight 90.7 kg.Body mass index is 24.34 kg/m.  General Appearance: Casual and Fairly Groomed  Eye Contact:  Good  Speech:  Clear and Coherent and Normal Rate  Volume:  Normal  Mood:  Anxious and Depressed   Affect:  Blunt, Congruent, Depressed and Flat  Thought Process:  Coherent and Goal Directed  Orientation:  Full (Time, Place, and Person)  Thought Content:  Delusions, Ideas of Reference:   Paranoia Delusions and Paranoid Ideation  Suicidal Thoughts:  No  Homicidal Thoughts:  No  Memory:  Immediate;   Fair Recent;   Fair Remote;   Fair  Judgement:  Fair  Insight:  Fair  Psychomotor Activity:  Normal  Concentration:  Concentration: Good  Recall:  Good  Fund of Knowledge:  Good  Language:  Good  Akathisia:  No  Handed:    AIMS (if indicated):     Assets:  Communication Skills Desire for Improvement Resilience  ADL's:  Intact  Cognition:  WNL  Sleep:  Number of Hours: 6.75   Treatment Plan Summary: Daily contact with patient to assess and evaluate symptoms and progress in treatment and Medication management   -Continue inpatient hospitalization  -Schizophrenia   -Continue Depakote ER 558m po qhs   -Continue zydis 146mpo BID  -anxiety    -Continue vistaril 2554mo tid prn anxiety  -GERD   -Continue protonix 67m62m qDay  -insomnia   -Continue trazodone 50mg55mqhs prn insomnia  -Encourage participation in groups and therapeutic milieu  -disposition planning will be ongoing  ChrisPennelope Bracken9/16/2019, 1:02 PM

## 2017-12-16 ENCOUNTER — Ambulatory Visit: Payer: Self-pay | Admitting: Adult Health

## 2017-12-16 ENCOUNTER — Telehealth: Payer: Self-pay | Admitting: Family Medicine

## 2017-12-16 NOTE — [facility] Suicide Risk Assessment (Signed)
[facility] INPATIENT:  Family/Significant Other Suicide Prevention Education  Suicide Prevention Education:  Education Completed; Ms Bascom LevelsFrazier, mother, [phone] has been identified by the patient as the family member/significant other with whom the patient will be residing, and identified as the person(s) who will aid the patient in the event of a mental health crisis (suicidal ideations/suicide attempt).  With written consent from the patient, the family member/significant other has been provided the following suicide prevention education, prior to the and/or following the discharge of the patient.  The suicide prevention education provided includes the following:  Suicide risk factors  Suicide prevention and interventions  National Suicide Hotline telephone number  [facility] assessment telephone number  [facility] Assistance 911  [facility] and/or Residential Mobile Crisis Unit telephone number  Request made of family/significant other to:  Remove weapons (e.g., guns, rifles, knives), all items previously/currently identified as safety concern.    Remove drugs/medications (over-the-counter, prescriptions, illicit drugs), all items previously/currently identified as a safety concern.  The family member/significant other verbalizes understanding of the suicide prevention education information provided.  The family member/significant other agrees to remove the items of safety concern listed above.  Ida RogueRodney B Dalma Panchal 12/16/2017, 5:21 PM

## 2017-12-16 NOTE — Telephone Encounter (Signed)
Pt mom  would like cory to call her back concerning her son  medication

## 2017-12-16 NOTE — [facility] Group Notes (Signed)
LCSW Group Therapy Note   12/16/2017 1:15pm   Type of Therapy and Topic:  Group Therapy:  Positive Affirmations   Participation Level:  Active  Description of Group: This group addressed positive affirmation toward self and others. Patients went around the room and identified two positive things about themselves and two positive things about a peer in the room. Patients reflected on how it felt to share something positive with others, to identify positive things about themselves, and to hear positive things from others. Patients were encouraged to have a daily reflection of positive characteristics or circumstances.  Therapeutic Goals 1. Patient will verbalize two of their positive qualities 2. Patient will demonstrate empathy for others by stating two positive qualities about a peer in the group 3. Patient will verbalize their feelings when voicing positive self affirmations and when voicing positive affirmations of others 4. Patients will discuss the potential positive impact on their wellness/recovery of focusing on positive traits of self and others. Summary of Patient Progress:  Started out by saying that he knows that medications are what he needs and why he is here. "It's a new start, which is what I need."  Later, went on a long discourse about how he needs to get away from others via relocations, and suggested it should be the [facility] [place].  Went on to describe extreme paranoia.  Therapeutic Modalities Cognitive Behavioral Therapy Motivational Interviewing  Ida RogueRodney B Rogerio Boutelle, KentuckyLCS[place] 12/16/2017 3:56 PM

## 2017-12-16 NOTE — Progress Notes (Signed)
[facility] MD Progress Note  12/16/2017 1:35 PM Alfred [name]  MRN:  [id number]  Subjective: Alfred [name] reports, "The voices are still going on. I just can't make out what they are saying. I am not seeing any visions any more. I'm taking the medicines. It is causing me to have constipation. I had some BM few minutes ago. It was loose. I feel a lot better. I'm doing okay. Everything is okay".  (Per Md's admission SRA):Patient is a 42 year old male with a past psychiatric history significant for chronic paranoid schizophrenia, seizure disorder, migraine headaches, hypertension, cannabis use disorder as well as alcohol use disorder. He presented to the [facility] emergency department on 12/13/2017. The patient presented there because he was having abdominal pain and his fear for constipation. He has not been able to go to the bathroom for several days, and this caused him to be suicidal. He is also significantly paranoid, and felt as though people were out to get him. He stated he was trying to leave the area over the last 3 months. He had been going to [facility] for outpatient treatment, and had been prescribed Depakote as well as olanzapine. He stated that he had run out of his medications. He stated he had missed his last appointment. He was admitted to the hospital for evaluation and stabilization.   Today, Alfred [name] is seen, chart reviewed. The chart findings discussed with the treatment team. He was lying in his bed in his room. He is alert, making good eye contact. He shares, "The voices are still going on. I just can't make out what they are saying. I am not seeing any visions any more. I'm taking the medicines. It is causing me to have constipation. I had some BM few minutes ago. It was loose. I feel a lot better. I'm doing okay. Everything is okay". He reports his symptoms are starting to diminish after being restarted on his previous medications. He denies SI/HI/AH/VH today. He is  sleeping well. His appetite is good. He is visible on the unit, attending group sessions. He is in agreement to continue his current regimen without changes. He had no further questions, comments, or concerns.  Principal Problem: Paranoid schizophrenia ([place]) Diagnosis:   Patient Active Problem List   Diagnosis Date Noted  . Paranoid schizophrenia ([place]) [F20.0] 06/19/2017    Priority: High  . Schizophrenia, paranoid type ([place]) [F20.0] 12/13/2017  . 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 Gastroesophageal reflux disease with esophagitis [K21.0]   . Cocaine abuse w/cocaine-induced psychotic disorder w/hallucinations ([place]) [F14.151] 12/07/2016  . Cannabis abuse with psychotic disorder ([place]) [F12.159] 12/07/2016  . Pericarditis [I31.9] 12/06/2016  . Rhabdomyolysis [M62.82] 12/06/2016  . EtOH dependence ([place]) [F10.20] 12/06/2016  . Transient alteration of awareness [R40.4] 08/28/2016  . Jerky body movements [R25.8] 08/28/2016  . Seizures ([place]) [R56.9] 06/18/2016  . Internal hemorrhoids [K64.8] 09/30/2013  . Acute psychosis ([employer]) [F23] 05/29/2006  . TOBACCO DEPENDENCE [F17.200] 05/29/2006   Total Time spent with patient: 15 minutes  Past Psychiatric History: See H&P  Past Medical History:  Past Medical History:  Diagnosis Date  . Alcohol abuse   . Depression   . Hypertension   . Migraines   . Schizophrenia ([place])   . Seizure ([place])    alcoho    Past Surgical History:  Procedure Laterality Date  . WISDOM TOOTH EXTRACTION     Family History:  Family History  Problem Relation Age of Onset  . Diabetes Mother   . Hypertension Mother   . Hyperlipidemia Mother   . Anemia Mother   . Heart murmur Mother   . Colon polyps Maternal Uncle   . Heart disease Maternal Uncle   . Celiac disease Maternal Grandmother   . Diabetes Maternal Grandmother   .  Diabetes Maternal Grandfather    Family Psychiatric  History: See H&P  Social History:  Social History   Substance and Sexual Activity  Alcohol Use Yes     Social History   Substance and Sexual Activity  Drug Use Yes  . Types: Marijuana    Social History   Socioeconomic History  . Marital status: Single    Spouse name: Not on file  . Number of children: 3  . Years of education: Not on file  . Highest education level: Not on file  Occupational History  . Occupation: Unemployed  Social Needs  . Financial resource strain: Not on file  . Food insecurity:    Worry: Not on file    Inability: Not on file  . Transportation needs:    Medical: Not on file    Non-medical: Not on file  Tobacco Use  . Smoking status: Current Some Day Smoker    Packs/day: 1.00    Types: Cigarettes  . Smokeless tobacco: Never Used  Substance and Sexual Activity  . Alcohol use: Yes  . Drug use: Yes    Types: Marijuana  . Sexual activity: Not on file  Lifestyle  . Physical activity:    Days per week: Not on file    Minutes per session: Not on file  . Stress: Not on file  Relationships  . Social connections:    Talks on phone: Not on file    Gets together: Not on file    Attends religious service: Not on file    Active member of club or organization: Not on file    Attends meetings of clubs or organizations: Not on file    Relationship status: Not on file  Other Topics Concern  . Not on file  Social History Narrative  . Not on file   Additional Social History:    Pain Medications: none Prescriptions: none Over the Counter: none History of alcohol / drug use?: Yes  Sleep: Good  Appetite:  Good  Current Medications: Current Facility-Administered Medications  Medication Dose Route Frequency Provider Last Rate Last Dose  . acetaminophen (TYLENOL) tablet 650 mg  650 mg Oral Q6H PRN Parks, Laurie Britton, NP   650 mg at 12/15/17 1706  . alum & mag hydroxide-simeth (MAALOX/MYLANTA)  200-200-20 MG/5ML suspension 30 mL  30 mL Oral Q4H PRN Parks, Laurie Britton, NP      . divalproex (DEPAKOTE ER) 24 hr tablet 500 mg  500 mg Oral QHS Clary, Greg Lawson, MD   500 mg at 12/15/17 2159  . famotidine (PEPCID) tablet 20 mg  20 mg Oral Daily Parks, Laurie Britton, NP   20 mg at 12/16/17 0757  . hydrOXYzine (ATARAX/VISTARIL) tablet 25 mg  25 mg Oral TID PRN Parks, Laurie Britton, NP   25 mg at 12/15/17 1944  . magnesium hydroxide (MILK OF MAGNESIA) suspension 30 mL  30 mL Oral Daily PRN Parks, Laurie Britton, NP      . multivitamin with minerals tablet 1 tablet  1 tablet Oral Daily Parks, Laurie Britton, NP   1 tablet at 12/16/17 0757  . nicotine (NICODERM CQ - dosed in   mg/24 hours) patch 21 mg  21 mg Transdermal Daily Ethelene Hal, NP   21 mg at 12/16/17 0757  . OLANZapine zydis (ZYPREXA) disintegrating tablet 10 mg  10 mg Oral BID Sharma Covert, MD   10 mg at 12/16/17 0757  . pantoprazole (PROTONIX) EC tablet 40 mg  40 mg Oral Daily Sharma Covert, MD   40 mg at 12/16/17 0757  . polyethylene glycol (MIRALAX / GLYCOLAX) packet 17 g  17 g Oral Daily PRN Ethelene Hal, NP      . polyethylene glycol (MIRALAX / GLYCOLAX) packet 17 g  17 g Oral Daily Sharma Covert, MD   17 g at 12/16/17 0757  . traZODone (DESYREL) tablet 50 mg  50 mg Oral QHS PRN Ethelene Hal, NP   50 mg at 12/15/17 2159   Lab Results:  Results for orders placed or performed during the hospital encounter of 12/13/17 (from the past 48 hour(s))  Valproic acid level     Status: Abnormal   Collection Time: 12/14/17  6:29 PM  Result Value Ref Range   Valproic Acid Lvl <10 (L) 50.0 - 100.0 ug/mL    Comment: Performed at [facility], [place] [address] [place], [place] [address]  CK     Status: Abnormal   Collection Time: 12/14/17  6:29 PM  Result Value Ref Range   Total CK 1,534 (H) 49 - 397 U/L    Comment: Performed at [facility], [place] [address] [place], [place] [address]  Comprehensive metabolic panel     Status: Abnormal   Collection Time: 12/14/17  6:29 PM  Result Value Ref Range   Sodium 141 135 - 145 mmol/L   Potassium 4.3 3.5 - 5.1 mmol/L   Chloride 106 98 - 111 mmol/L   CO2 27 22 - 32 mmol/L   Glucose, Bld 109 (H) 70 - 99 mg/dL   BUN 10 6 - 20 mg/dL   Creatinine, Ser 1.30 (H) 0.61 - 1.24 mg/dL   Calcium 8.8 (L) 8.9 - 10.3 mg/dL   Total Protein 6.2 (L) 6.5 - 8.1 g/dL   Albumin 3.4 (L) 3.5 - 5.0 g/dL   AST 72 (H) 15 - 41 U/L   ALT 39 0 - 44 U/L   Alkaline Phosphatase 44 38 - 126 U/L   Total Bilirubin 0.5 0.3 - 1.2 mg/dL   GFR calc non Af Amer >60 >60 mL/min   GFR calc Af Amer >60 >60 mL/min    Comment: (NOTE) The eGFR has been calculated using the CKD EPI equation. This calculation has not been validated in all clinical situations. eGFR's persistently <60 mL/min signify possible Chronic Kidney Disease.    Anion gap 8 5 - 15    Comment: Performed at [facility], [place] [address], [place], [place] [address]   Blood Alcohol level:  Lab Results  Component Value Date   ETH <10 12/12/2017   ETH <10 19/62/2297   Metabolic Disorder Labs: Lab Results  Component Value Date   HGBA1C 5.9 06/19/2017   No results found for: PROLACTIN Lab Results  Component Value Date   CHOL 185 06/19/2017   TRIG 330.0 (H) 06/19/2017   HDL 48.40 06/19/2017   CHOLHDL 4 06/19/2017   VLDL 66.0 (H) 06/19/2017   LDLCALC 92 06/14/2016   Physical Findings: AIMS:  , ,  ,  ,    CIWA:    COWS:     Musculoskeletal: Strength &  Muscle Tone: within normal limits Gait & Station: normal Patient leans: N/A  Psychiatric Specialty Exam: Physical Exam  Nursing note and vitals reviewed.   Review of Systems  Constitutional: Negative for chills and fever.  Respiratory: Negative for cough and shortness of breath.   Cardiovascular: Negative for chest pain.  Gastrointestinal: Negative for abdominal pain, heartburn, nausea  and vomiting.  Psychiatric/Behavioral: Negative for depression, hallucinations and suicidal ideas. The patient is not nervous/anxious and does not have insomnia.     Blood pressure 127/82, pulse 76, temperature 98.7 F (37.1 C), temperature source Oral, resp. rate 20, height 6' 4" (1.93 m), weight 90.7 kg.Body mass index is 24.34 kg/m.  General Appearance: Casual and Fairly Groomed  Eye Contact:  Good  Speech:  Clear and Coherent and Normal Rate  Volume:  Normal  Mood:  Anxious and Depressed  Affect:  Blunt, Congruent, Depressed and Flat  Thought Process:  Coherent and Goal Directed  Orientation:  Full (Time, Place, and Person)  Thought Content:  Delusions, Ideas of Reference:   Paranoia Delusions and Paranoid Ideation  Suicidal Thoughts:  No  Homicidal Thoughts:  No  Memory:  Immediate;   Fair Recent;   Fair Remote;   Fair  Judgement:  Fair  Insight:  Fair  Psychomotor Activity:  Normal  Concentration:  Concentration: Good  Recall:  Good  Fund of Knowledge:  Good  Language:  Good  Akathisia:  No  Handed:    AIMS (if indicated):     Assets:  Communication Skills Desire for Improvement Resilience  ADL's:  Intact  Cognition:  WNL  Sleep:  Number of Hours: 6.25   Treatment Plan Summary: Daily contact with patient to assess and evaluate symptoms and progress in treatment and Medication management   -Continue inpatient hospitalization.  -Will continue today 12/16/2017 plan as below except where it is noted.  -Schizophrenia   -Continue Depakote ER 500mg po qhs   -Continue Zyprexa zydis 10mg po BID  -anxiety    -Continue vistaril 25mg po tid prn anxiety  -GERD   -Continue protonix 40mg po qDay  -insomnia   -Continue trazodone 50mg po qhs prn insomnia  -Encourage participation in groups and therapeutic milieu  -disposition planning will be ongoing  Agnes Nwoko, NP, PMHNP, FNP-BC. 12/16/2017, 1:35 PMPatient ID: Alfred [name], male   DOB: 02/11/1976, 42 y.o.    MRN: [id number]  

## 2017-12-16 NOTE — Progress Notes (Signed)
Recreation Therapy Notes  Date: 9.17.19 Time: 1000 Location: 500 Hall Dayroom  Group Topic: Leisure Education  Goal Area(s) Addresses:  Patient will identify positive leisure activities.  Patient will identify one positive benefit of participation in leisure activities.   Intervention: AT&TWhite board, dry erase markers, various words  Activity: Leisure IT trainerictionary.  One patient would come to the board and get a slip of paper from the container.  The patient would draw the activity named on the paper onto the white board.  The patient that makes the correct guess, gets the next turn.  SN:  If the group is large enough, the group can be broken up into teams.  Education:  Leisure Education, Building control surveyorDischarge Planning  Education Outcome: Acknowledges education/In group clarification offered/Needs additional education  Clinical Observations/Feedback: Pt did not attend group.    Caroll RancherMarjette Jiro Kiester, LRT/CTRS      Lillia AbedLindsay, Ricca Melgarejo A 12/16/2017 12:00 PM

## 2017-12-16 NOTE — Telephone Encounter (Signed)
Mother states pt is worse.  Would like to know what to do.  Please advise.

## 2017-12-16 NOTE — Progress Notes (Signed)
Patient ID: Alfred [name], male   DOB: [birth year], 42 y.o.   MRN: [id number]   D: Patient has a flat affect on approach tonight. Reports having a headache at the back of the head but given tylenol earlier. Did give some vistaril for anxiety after family visited. He reports mood improvement and denies any SI tonight. No delusional contents when speaking with him but conversation minimal. A: Staff will monitor on q 15 minute checks, follow treatment plan, and give medications as ordered. R: Took medications without issue.

## 2017-12-16 NOTE — Telephone Encounter (Signed)
Left a message for a return call.

## 2017-12-16 NOTE — Telephone Encounter (Signed)
He really needs to follow up with psychiatry

## 2017-12-16 NOTE — Progress Notes (Signed)
Recreation Therapy Notes  INPATIENT RECREATION THERAPY ASSESSMENT  Patient Details Name: Alfred [name] MRN: [id number] DOB: 04-07-1975 Today's Date: 12/16/2017       Information Obtained From: Patient  Able to Participate in Assessment/Interview: Yes  Patient Presentation: Alert  Reason for Admission (Per Patient): Other (Comments)(Pt stated "I don't recall"; Pt then stated he was hearing voices, couldn't sleep, eat and thought someone was trying to kill him".)  Patient Stressors: Other (Comment)(Constipated; Urine was [name] brown color)  Coping Skills:   Isolation, TV, Arguments, Music, Exercise, Deep Breathing, Meditate, Substance Abuse, Impulsivity, Prayer, Art, Avoidance  Leisure Interests (2+):  Music - Play instrument, Individual - Other (Comment)(Sell things online)  Frequency of Recreation/Participation: Other (Comment)(Daily)  Awareness of Community Resources:  Yes  Community Resources:  Library, [employer], Other (Comment)(Stores)  Current Use: No  If no, Barriers?: Other (Comment)(Prefers to stay in the house)  Expressed Interest in State Street CorporationCommunity Resource Information: Yes  [place]  Patient Main Form of Transportation: Walk  Patient Strengths:  "I try"; Listening  Patient Identified Areas of Improvement:  Habits; "Amount of sleep I get"  Patient Goal for Hospitalization:  "Get some kind of plan of action".  Current SI (including self-harm):  No  Current HI:  No  Current AVH: No  Staff Intervention Plan: Group Attendance, Collaborate with Interdisciplinary Treatment Team  Consent to Intern Participation: N/[name]   Caroll RancherMarjette Zakyah [name], LRT/CTRS  Caroll RancherLindsay, Alfred [name] 12/16/2017, 3:20 PM

## 2017-12-16 NOTE — Progress Notes (Signed)
Psychoeducational Group Note  Date:  12/16/2017 Time:  2138  Group Topic/Focus:  Wrap-Up Group:   The focus of this group is to help patients review their daily goal of treatment and discuss progress on daily workbooks.  Participation Level: Did Not Attend  Participation Quality:  Not Applicable  Affect:  Not Applicable  Cognitive:  Not Applicable  Insight:  Not Applicable  Engagement in Group: Not Applicable  Additional Comments:  The patient did not attend group this evening.   Hazle CocaGOODMAN, Bryann Gentz S 12/16/2017, 9:38 PM

## 2017-12-17 ENCOUNTER — Ambulatory Visit: Payer: Self-pay | Admitting: Neurology

## 2017-12-17 LAB — CK ISOENZYMES
CK BB: 0 %
CK MB: 0 % (ref 0–3)
CK MM: 100 % (ref 97–100)
CK MM: 100 % (ref 97–100)
CK-BB: 0 %
CK-MB: 0 % (ref 0–3)
Creatine Kinase-Total: 1982 U/L — ABNORMAL HIGH (ref 24–204)
Creatine Kinase-Total: 1141 U/L — ABNORMAL HIGH (ref 24–204)
MACRO TYPE 1: 0 %
MACRO TYPE 2: 0 %
MACRO TYPE 2: 0 %
Macro Type 1: 0 %

## 2017-12-17 NOTE — Progress Notes (Signed)
Patient attended wrap up group and participated.  

## 2017-12-17 NOTE — Progress Notes (Signed)
Recreation Therapy Notes  Date: 9.16.19 Time: 1000 Location: 500 Hall Dayroom  Group Topic: Coping Skills  Goal Area(s) Addresses:  Patient will be able to identify positive coping skills. Patient will be able to identify benefits of using coping skills post d/c.  Behavioral Response:  Engaged  Intervention: Worksheet, pencils  Activity: OrthoptistWeb Design.  Patients were to identify the things, people, situations they feel are holding them back and write them inside the web.  Patients were to then identify their coping skills and place them on the outside of the web.  Education: PharmacologistCoping Skills, Building control surveyorDischarge Planning.   Education Outcome: Acknowledges understanding/In group clarification offered/Needs additional education.   Clinical Observations/Feedback: Pt stated some of the things holding him back are finances, time, location, transportation, anxiety and family.  Pt expressed some of his coping skills were prayer, finding income, reading, listening to music and getting a bus pass.    Caroll RancherMarjette Kyana Aicher, LRT/CTRS      Caroll RancherLindsay, Jennilyn Esteve A 12/17/2017 12:11 PM

## 2017-12-17 NOTE — Progress Notes (Signed)
Nursing Progress Note: 7p-7a D: Pt currently presents with a anxious/minimal/isolative affect and behavior.  Interacting minimally with the milieu. Pt reports good sleep during the previous night with current medication regimen. Pt did attend wrap-up group.  A: Pt provided with medications per providers orders. Pt's labs and vitals were monitored throughout the night. Pt supported emotionally and encouraged to express concerns and questions. Pt educated on medications.  R: Pt's safety ensured with 15 minute and environmental checks. Pt currently denies SI, HI, and AVH. Pt verbally contracts to seek staff if SI,HI, or AVH occurs and to consult with staff before acting on any harmful thoughts. Will continue to monitor.

## 2017-12-17 NOTE — Progress Notes (Signed)
[facility]  12/17/2017 1:42 PM Alfred [name]  MRN:  [id number]  Subjective: Alfred [name] reports, "My butt hurts today. My mood is up & down because I don't know what my plan is after discharge. I do not want to go back to my old home. I don't feel safe there. I don't like what was going on there prior to my coming to the hospital. People were after me. Other people were being hurt because of me. I don't know how to explain it but it was happening. I am feeling down today, but not depressed".  (Per Md's admission SRA):Patient is a 42 year old male with a past psychiatric history significant for chronic paranoid schizophrenia, seizure disorder, migraine headaches, hypertension, cannabis use disorder as well as alcohol use disorder. He presented to [facility] emergency department on 12/13/2017. The patient presented there because he was having abdominal pain and his fear for constipation. He has not been able to go to the bathroom for several days, and this caused him to be suicidal. He is also significantly paranoid, and felt as though people were out to get him. He stated he was trying to leave the area over the last 3 months. [facility] had [facility], and had been prescribed Depakote as well as olanzapine. He stated that he had run out of his medications. He stated he had missed his last appointment. He was admitted to the hospital for evaluation and stabilization.   Today, Alfred [name] is seen, chart reviewed. The chart findings discussed with the treatment team. He was sitting up on his bed in his room. He is alert, making good eye contact. He presents delusional & paranoid about going back to his place of resident after discharge. He says there are people after him & people being hurt because of him. He believes that this is real, but having problem explaining it. He says he feels down, but not depressed. He is taking the medicines. He  complains of some pain to his bottom area probably from having multiple loose stools after taking a lot of laxatives for constipation. He denies SI/HI/AH/VH today. He is sleeping well. His appetite is good. He is visible on the unit, attending group sessions. He is in agreement to continue his current regimen without changes. He had no further questions, comments, or concerns.  Principal Problem: Paranoid schizophrenia (HCC)  Diagnosis:   Patient Active Problem List   Diagnosis Date Noted  . Paranoid schizophrenia (HCC) [F20.0] 06/19/2017    Priority: High  . Schizophrenia, paranoid type (HCC) [F20.0] 12/13/2017  . 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 Gastroesophageal reflux disease with esophagitis [K21.0]   . 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 Cannabis abuse with psychotic disorder (HCC) [F12.159] 12/07/2016  . Pericarditis [I31.9] 12/06/2016  . Rhabdomyolysis [M62.82] 12/06/2016  . EtOH dependence (HCC) [F10.20] 12/06/2016  . Transient alteration of awareness [R40.4] 08/28/2016  . Jerky body movements [R25.8] 08/28/2016  . Seizures (HCC) [R56.9] 06/18/2016  . Internal hemorrhoids [K64.8] 09/30/2013  . Acute psychosis (HCC) [F23] 05/29/2006  . TOBACCO DEPENDENCE [F17.200] 05/29/2006   Total Time spent with patient: 15 minutes  Past Psychiatric History: See H&P  Past Medical History:  Past Medical History:  Diagnosis Date  . Alcohol abuse   . Depression   . Hypertension   . Migraines   . Schizophrenia (HCC)   .  Seizure (HCC)    alcoho    Past Surgical History:  Procedure Laterality Date  . WISDOM TOOTH EXTRACTION     Family History:  Family History  Problem Relation Age of Onset  . Diabetes Mother   . Hypertension Mother   . Hyperlipidemia Mother   . Anemia Mother   .  Heart murmur Mother   . Colon polyps Maternal Uncle   . Heart disease Maternal Uncle   . Celiac disease Maternal Grandmother   . Diabetes Maternal Grandmother   . Diabetes Maternal Grandfather    Family Psychiatric  History: See H&P  Social History:  Social History   Substance and Sexual Activity  Alcohol Use Yes     Social History   Substance and Sexual Activity  Drug Use Yes  . Types: Marijuana    Social History   Socioeconomic History  . Marital status: Single    Spouse name: Not on file  . Number of children: 3  . Years of education: Not on file  . Highest education level: Not on file  Occupational History  . Occupation: Unemployed  Social Needs  . Financial resource strain: Not on file  . Food insecurity:    Worry: Not on file    Inability: Not on file  . Transportation needs:    Medical: Not on file    Non-medical: Not on file  Tobacco Use  . Smoking status: Current Some Day Smoker    Packs/day: 1.00    Types: Cigarettes  . Smokeless tobacco: Never Used  Substance and Sexual Activity  . Alcohol use: Yes  . Drug use: Yes    Types: Marijuana  . Sexual activity: Not on file  Lifestyle  . Physical activity:    Days per week: Not on file    Minutes per session: Not on file  . Stress: Not on file  Relationships  . Social connections:    Talks on phone: Not on file    Gets together: Not on file    Attends religious service: Not on file    Active member of club or organization: Not on file    Attends meetings of clubs or organizations: Not on file    Relationship status: Not on file  Other Topics Concern  . Not on file  Social History Narrative  . Not on file   Additional Social History:    Pain Medications: none Prescriptions: none Over the Counter: none History of alcohol / drug use?: Yes  Sleep: Good  Appetite:  Good  Current Medications: Current Facility-Administered Medications  Medication Dose Route Frequency Provider Last Rate Last  Dose  . acetaminophen (TYLENOL) tablet 650 mg  650 mg Oral Q6H PRN Laveda Abbe, NP   650 mg at 12/15/17 1706  . alum & mag hydroxide-simeth (MAALOX/MYLANTA) 200-200-20 MG/5ML suspension 30 mL  30 mL Oral Q4H PRN Laveda Abbe, NP      . divalproex (DEPAKOTE ER) 24 hr tablet 500 mg  500 mg Oral QHS Antonieta Pert, MD   500 mg at 12/16/17 2339  . famotidine (PEPCID) tablet 20 mg  20 mg Oral Daily Laveda Abbe, NP   20 mg at 12/17/17 0740  . hydrOXYzine (ATARAX/VISTARIL) tablet 25 mg  25 mg Oral TID PRN Laveda Abbe, NP   25 mg at 12/16/17 2340  . magnesium hydroxide (MILK OF MAGNESIA) suspension 30 mL  30 mL Oral Daily PRN Laveda Abbe, NP      .  multivitamin with minerals tablet 1 tablet  1 tablet Oral Daily Laveda Abbe, NP   1 tablet at 12/17/17 0740  . nicotine (NICODERM CQ - dosed in mg/24 hours) patch 21 mg  21 mg Transdermal Daily Laveda Abbe, NP   21 mg at 12/17/17 0741  . OLANZapine zydis (ZYPREXA) disintegrating tablet 10 mg  10 mg Oral BID Antonieta Pert, MD   10 mg at 12/17/17 0740  . pantoprazole (PROTONIX) EC tablet 40 mg  40 mg Oral Daily Antonieta Pert, MD   40 mg at 12/17/17 0740  . polyethylene glycol (MIRALAX / GLYCOLAX) packet 17 g  17 g Oral Daily PRN Laveda Abbe, NP      . polyethylene glycol (MIRALAX / GLYCOLAX) packet 17 g  17 g Oral Daily Antonieta Pert, MD   17 g at 12/17/17 0741  . traZODone (DESYREL) tablet 50 mg  50 mg Oral QHS PRN Laveda Abbe, NP   50 mg at 12/16/17 2340   Lab Results:  No results found for this or any previous visit (from the past 48 hour(s)). Blood Alcohol level:  Lab Results  Component Value Date   ETH <10 12/12/2017   ETH <10 12/10/2017   Metabolic Disorder Labs: Lab Results  Component Value Date   HGBA1C 5.9 06/19/2017   No results found for: PROLACTIN Lab Results  Component Value Date   CHOL 185 06/19/2017   TRIG 330.0 (H) 06/19/2017    HDL 48.40 06/19/2017   CHOLHDL 4 06/19/2017   VLDL 66.0 (H) 06/19/2017   LDLCALC 92 06/14/2016   Physical Findings: AIMS:  , ,  ,  ,    CIWA:    COWS:     Musculoskeletal: Strength & Muscle Tone: within normal limits Gait & Station: normal Patient leans: N/A  Psychiatric Specialty Exam: Physical Exam  Nursing note and vitals reviewed.   Review of Systems  Constitutional: Negative for chills and fever.  Respiratory: Negative for cough and shortness of breath.   Cardiovascular: Negative for chest pain.  Gastrointestinal: Negative for abdominal pain, heartburn, nausea and vomiting.  Psychiatric/Behavioral: Negative for depression, hallucinations and suicidal ideas. The patient is not nervous/anxious and does not have insomnia.     Blood pressure 113/76, pulse 92, temperature 98.6 F (37 C), temperature source Oral, resp. rate 20, height 6\' 4"  (1.93 m), weight 90.7 kg.Body mass index is 24.34 kg/m.  General Appearance: Casual and Fairly Groomed  Eye Contact:  Good  Speech:  Clear and Coherent and Normal Rate  Volume:  Normal  Mood:  Anxious and Depressed  Affect:  Blunt, Congruent, Depressed and Flat  Thought Process:  Coherent and Goal Directed  Orientation:  Full (Time, Place, and Person)  Thought Content:  Delusions, Ideas of Reference:   Paranoia Delusions and Paranoid Ideation  Suicidal Thoughts:  No  Homicidal Thoughts:  No  Memory:  Immediate;   Fair Recent;   Fair Remote;   Fair  Judgement:  Fair  Insight:  Fair  Psychomotor Activity:  Normal  Concentration:  Concentration: Good  Recall:  Good  Fund of Knowledge:  Good  Language:  Good  Akathisia:  No  Handed:    AIMS (if indicated):     Assets:  Communication Skills Desire for Improvement Resilience  ADL's:  Intact  Cognition:  WNL  Sleep:  Number of Hours: 6.5   Treatment Plan Summary: Daily contact with patient to assess and evaluate symptoms and progress in treatment  and Medication management    -Continue inpatient hospitalization.  -Will continue today 12/17/2017 plan as below except where it is noted.  -Schizophrenia   -Continue Depakote ER 500mg  po qhs   -Continue Zyprexa zydis 10mg  po BID  -anxiety    -Continue vistaril 25mg  po tid prn anxiety  -GERD   -Continue protonix 40mg  po qDay  -insomnia   -Continue trazodone 50mg  po qhs prn insomnia  -Encourage participation in groups and therapeutic milieu  -disposition planning will be ongoing  Armandina Stammer, NP, PMHNP, FNP-BC. 12/17/2017, 1:42 PMPatient ID: Alfred Leigh, male   DOB: 1976/02/04, 42 y.o.   MRN: [id number]

## 2017-12-17 NOTE — [facility] Group Notes (Signed)
LCSW Group Therapy Note  12/17/2017 1:15pm  Type of Therapy and Topic:  Group Therapy:  Feelings around Relapse and Recovery  Participation Level:  Active   Description of Group:    Patients in this group will discuss emotions they experience before and after a relapse. They will process how experiencing these feelings, or avoidance of experiencing them, relates to having a relapse. Facilitator will guide patients to explore emotions they have related to recovery. Patients will be encouraged to process which emotions are more powerful. They will be guided to discuss the emotional reaction significant others in their lives may have to their relapse or recovery. Patients will be assisted in exploring ways to respond to the emotions of others without this contributing to a relapse.  Therapeutic Goals: 1. Patient will identify two or more emotions that lead to a relapse for them 2. Patient will identify two emotions that result when they relapse 3. Patient will identify two emotions related to recovery 4. Patient will demonstrate ability to communicate their needs through discussion and/or role plays   Summary of Patient Progress:  Alfred [name] attended the entire session. He was engaged in the activity. Alfred [name] described recovery as regularly taking his medication to keep the right balance in his life. He also reported that family members are not helpful when they ask him about taking his medication.    Therapeutic Modalities:   Cognitive Behavioral Therapy Solution-Focused Therapy Assertiveness Training Relapse Prevention Therapy   Ida RogueRodney B Donnalee Cellucci, LCSW 12/17/2017 2:17 PM

## 2017-12-17 NOTE — Progress Notes (Signed)
Patient denies SI, HI, AVH this shift.  Patient does report increased auditory hallucinations in the evenings which cause him a great deal of distress. Patient has been compliant with medications. Patient has somatic complaints of lower leg pain.   Assess patient for safety, offer medications as prescribed, engage patient in 1:1 staff talks.   Patient able to contract for safety.  Continue to monitor as planned.

## 2017-12-18 ENCOUNTER — Telehealth: Payer: Self-pay | Admitting: Adult Health

## 2017-12-18 ENCOUNTER — Ambulatory Visit: Payer: Medicare HMO | Admitting: Adult Health

## 2017-12-18 LAB — CK ISOENZYMES
CK-BB: 0 %
CK-MB: 0 % (ref 0–3)
CK-MM: 100 % (ref 97–100)
Creatine Kinase-Total: 626 U/L — ABNORMAL HIGH (ref 24–204)
Macro Type 1: 0 %
Macro Type 2: 0 %

## 2017-12-18 NOTE — Telephone Encounter (Signed)
Copied from CRM [id number]. Topic: General - Other >> Dec 18, 2017  8:23 AM Ronney LionArrington, Shykila A wrote: Reason for CRM: Patient's mother called saying she already took her son to see [facility]. She says it hasn't helped. Patient is now hospitalized and mother wants to know what are the next steps after hospitalization. Also she has some concerns regarding her son's medication.   Please advise

## 2017-12-18 NOTE — Progress Notes (Addendum)
Nursing Progress Note: 7p-7a D: Pt currently presents with a depressed/anxious/flat affect and behavior. Interacting appropriately with the milieu. Pt reports good sleep during the previous night with current medication regimen. Pt refused to attend wrap-up group.  A: Pt refused to get up for medications. Two attempts were made. Pt refused both times. Pt's labs and vitals were monitored throughout the night. Pt supported emotionally and encouraged to express concerns and questions. Pt educated on medications.  R: Pt's safety ensured with 15 minute and environmental checks. Pt currently denies SI, HI, and AVH. Pt verbally contracts to seek staff if SI,HI, or AVH occurs and to consult with staff before acting on any harmful thoughts. Will continue to monitor.

## 2017-12-18 NOTE — Tx Team (Signed)
Interdisciplinary Treatment and Diagnostic Plan Update  12/18/2017 Time of Session: 4:51 PM  Alfred [name] MRN: [id number]  Principal Diagnosis: Paranoid schizophrenia [facility])  Secondary Diagnoses: Principal Problem:   Paranoid schizophrenia ([place])   Current Medications:  Current Facility-Administered Medications  Medication Dose Route Frequency Provider Last Rate Last Dose  . acetaminophen (TYLENOL) tablet 650 mg  650 mg Oral Q6H PRN Ethelene Hal, NP   650 mg at 12/15/17 1706  . alum & mag hydroxide-simeth (MAALOX/MYLANTA) 200-200-20 MG/5ML suspension 30 mL  30 mL Oral Q4H PRN Ethelene Hal, NP      . divalproex (DEPAKOTE ER) 24 hr tablet 500 mg  500 mg Oral QHS Sharma Covert, MD   500 mg at 12/17/17 2209  . famotidine (PEPCID) tablet 20 mg  20 mg Oral Daily Ethelene Hal, NP   20 mg at 12/18/17 0720  . hydrOXYzine (ATARAX/VISTARIL) tablet 25 mg  25 mg Oral TID PRN Ethelene Hal, NP   25 mg at 12/18/17 0720  . magnesium hydroxide (MILK OF MAGNESIA) suspension 30 mL  30 mL Oral Daily PRN Ethelene Hal, NP      . multivitamin with minerals tablet 1 tablet  1 tablet Oral Daily Ethelene Hal, NP   1 tablet at 12/18/17 0720  . nicotine (NICODERM CQ - dosed in mg/24 hours) patch 21 mg  21 mg Transdermal Daily Ethelene Hal, NP   21 mg at 12/18/17 0720  . OLANZapine zydis (ZYPREXA) disintegrating tablet 10 mg  10 mg Oral BID Sharma Covert, MD   10 mg at 12/18/17 0721  . pantoprazole (PROTONIX) EC tablet 40 mg  40 mg Oral Daily Sharma Covert, MD   40 mg at 12/18/17 0720  . polyethylene glycol (MIRALAX / GLYCOLAX) packet 17 g  17 g Oral Daily PRN Ethelene Hal, NP      . polyethylene glycol (MIRALAX / GLYCOLAX) packet 17 g  17 g Oral Daily Sharma Covert, MD   17 g at 12/18/17 0720  . traZODone (DESYREL) tablet 50 mg  50 mg Oral QHS PRN Ethelene Hal, NP   50 mg at 12/16/17 2340    PTA  Medications: Medications Prior to Admission  Medication Sig Dispense Refill Last Dose  . acetaminophen (TYLENOL) 325 MG tablet Take 2 tablets (650 mg total) by mouth every 6 (six) hours as needed for mild pain, fever or headache. (Patient not taking: Reported on 12/12/2017) 40 tablet 0 Unknown at Unknown time  . divalproex (DEPAKOTE ER) 500 MG 24 hr tablet Take 2 tablets (1,000 mg total) by mouth at bedtime. 60 tablet 11 Unknown at Unknown time  . Multiple Vitamin (MULTIVITAMIN WITH MINERALS) TABS tablet Take 1 tablet by mouth daily. 30 tablet 3 Unknown at Unknown time  . nicotine (NICODERM CQ - DOSED IN MG/24 HOURS) 21 mg/24hr patch Place 1 patch (21 mg total) onto the skin daily. 28 patch 0 Unknown at Unknown time  . OLANZapine (ZYPREXA) 10 MG tablet Take 1 tablet (10 mg total) by mouth 2 (two) times daily. 60 tablet 1 Unknown at Unknown time  . omeprazole (PRILOSEC) 40 MG capsule Take 1 capsule (40 mg total) by mouth daily. (Patient not taking: Reported on 12/12/2017) 30 capsule 2 Unknown at Unknown time  . omeprazole (PRILOSEC) 40 MG capsule TAKE 1 CAPSULE BY MOUTH EVERY DAY (Patient not taking: Reported on 12/12/2017) 90 capsule 1 Unknown at Unknown time  . polyethylene glycol (MIRALAX /  GLYCOLAX) packet Take 17 g by mouth daily as needed for moderate constipation. 28 each 0 Unknown at Unknown time  . ranitidine (ZANTAC) 150 MG tablet TAKE 1 TABLET BY MOUTH AT BEDTIME (Patient not taking: Reported on 12/12/2017) 90 tablet 3 Unknown at Unknown time    Patient Stressors: Financial difficulties Medication change or noncompliance  Patient Strengths: Ability for insight Motivation for treatment/growth  Treatment Modalities: Medication Management, Group therapy, Case management,  1 to 1 session with clinician, Psychoeducation, Recreational therapy.   Physician Treatment Plan for Primary Diagnosis: Paranoid schizophrenia ([place]) Long Term Goal(s): Improvement in symptoms so as ready for  discharge  Short Term Goals: Ability to identify changes in lifestyle to reduce recurrence of condition will improve Ability to disclose and discuss suicidal ideas Ability to demonstrate self-control will improve Ability to identify and develop effective coping behaviors will improve Compliance with prescribed medications will improve Ability to identify triggers associated with substance abuse/mental health issues will improve  Medication Management: Evaluate patient's response, side effects, and tolerance of medication regimen.  Therapeutic Interventions: 1 to 1 sessions, Unit Group sessions and Medication administration.  Evaluation of Outcomes: Adequate for Discharge  Physician Treatment Plan for Secondary Diagnosis: Principal Problem:   Paranoid schizophrenia ([place])   Long Term Goal(s): Improvement in symptoms so as ready for discharge  Short Term Goals: Ability to identify changes in lifestyle to reduce recurrence of condition will improve Ability to disclose and discuss suicidal ideas Ability to demonstrate self-control will improve Ability to identify and develop effective coping behaviors will improve Compliance with prescribed medications will improve Ability to identify triggers associated with substance abuse/mental health issues will improve  Medication Management: Evaluate patient's response, side effects, and tolerance of medication regimen.  Therapeutic Interventions: 1 to 1 sessions, Unit Group sessions and Medication administration.  Evaluation of Outcomes: Adequate for Discharge   RN Treatment Plan for Primary Diagnosis: Paranoid schizophrenia ([place]) Long Term Goal(s): Knowledge of disease and therapeutic regimen to maintain health will improve  Short Term Goals: Ability to identify and develop effective coping behaviors will improve and Compliance with prescribed medications will improve  Medication Management: RN will administer medications as ordered by  provider, will assess and evaluate patient's response and provide education to patient for prescribed medication. RN will report any adverse and/or side effects to prescribing provider.  Therapeutic Interventions: 1 on 1 counseling sessions, Psychoeducation, Medication administration, Evaluate responses to treatment, Monitor vital signs and CBGs as ordered, Perform/monitor CIWA, COWS, AIMS and Fall Risk screenings as ordered, Perform wound care treatments as ordered.  Evaluation of Outcomes: Adequate for Discharge   LCSW Treatment Plan for Primary Diagnosis: Paranoid schizophrenia ([place]) Long Term Goal(s): Safe transition to appropriate next level of care at discharge, Engage patient in therapeutic group addressing interpersonal concerns.  Short Term Goals: Engage patient in aftercare planning with referrals and resources  Therapeutic Interventions: Assess for all discharge needs, 1 to 1 time with Social worker, Explore available resources and support systems, Assess for adequacy in community support network, Educate family and significant other(s) on suicide prevention, Complete Psychosocial Assessment, Interpersonal group therapy.  Evaluation of Outcomes: Met  Return home, follow up outpt   Progress in Treatment: Attending groups: Yes Participating in groups: Yes Taking medication as prescribed: Yes Toleration medication: Yes, no side effects reported at this time Family/Significant other contact made: Yes Patient understands diagnosis: Yes AEB asking for help with getting back on medication Discussing patient identified problems/goals with staff: Yes Medical problems stabilized or  resolved: Yes Denies suicidal/homicidal ideation: Yes Issues/concerns per patient self-inventory: None Other: N/A  New problem(s) identified: None identified at this time.   New Short Term/Long Term Goal(s): "I think I might need to find a way to relocate. I feel like everywhere I go people get hurt."    Discharge Plan or Barriers:   Reason for Continuation of Hospitalization:  Medication stabilization   Estimated Length of Stay: [place] d/c tomorrow, 9/20  Attendees: Patient:   12/18/2017  4:51 PM  Physician: Maris Berger, MD 12/18/2017  4:51 PM  Nursing: Sena Hitch, RN 12/18/2017  4:51 PM  RN Care Manager: Lars Pinks, RN 12/18/2017  4:51 PM  Social Worker: Ripley Fraise 12/18/2017  4:51 PM  Recreational Therapist: Winfield Cunas 12/18/2017  4:51 PM  Other: Norberto Sorenson 12/18/2017  4:51 PM  Other:  12/18/2017  4:51 PM    Scribe for Treatment Team:  Roque Lias LCSW 12/18/2017 4:51 PM

## 2017-12-18 NOTE — [facility] Group Notes (Signed)
[facility] Group Notes:  (Nursing/MHT/Case Management/Adjunct)  Date:  12/18/2017  Time:  4:50 PM  Type of Therapy:  Psychoeducational Skills  Participation Level:  Did Not Attend    Audrie Lializabeth O Geronimo Diliberto 12/18/2017, 4:50 PM

## 2017-12-18 NOTE — Telephone Encounter (Signed)
Spoke with patients mother. She had questions concerning medications that had been changed and new medications while in the hospital. She wanted to know about discharge instructions and how to help take care of her son once he is discharged. Patient has been advised to speak with hospital staff regarding the questions.

## 2017-12-18 NOTE — Telephone Encounter (Signed)
Spoke with patients mother regarding concerns. Please see phone note from 12/18/17.

## 2017-12-18 NOTE — Progress Notes (Signed)
Patient denies SI, HI, AVH this shift.  Patient does report increased auditory hallucinations in the evenings which cause him a great deal of distress. Patient has been compliant with medications. Patient has somatic complaints of lower leg pain.   Assess patient for safety, offer medications as prescribed, engage patient in 1:1 staff talks.   Patient able to contract for safety.  Continue to monitor as planned.

## 2017-12-18 NOTE — Progress Notes (Signed)
Psychoeducational Group Note  Date:  12/18/2017 Time:  2129  Group Topic/Focus:  Wrap-Up Group:   The focus of this group is to help patients review their daily goal of treatment and discuss progress on daily workbooks.  Participation Level: Did Not Attend  Participation Quality:  Not Applicable  Affect:  Not Applicable  Cognitive:  Not Applicable  Insight:  Not Applicable  Engagement in Group: Not Applicable  Additional Comments:  The patient did not attend group.   Hazle CocaGOODMAN, Bess Saltzman S 12/18/2017, 9:29 PM

## 2017-12-18 NOTE — Progress Notes (Signed)
Recreation Therapy Notes  Date: 9.19.19 Time: 1000 Location: 500 Hall Dayroom   Group Topic: Communication, Team Building, Problem Solving  Goal Area(s) Addresses:  Patient will effectively work with peer towards shared goal.  Patient will identify skill used to make activity successful.  Patient will identify how skills used during activity can be used to reach post d/c goals.   Behavioral Response: Engaged  Intervention: STEM Activity   Activity: [place]. Patients were provided the following materials: 5 drinking straws, 5 rubber bands, 5 paper clips, 2 index cards, 2 drinking cups, and 2 toilet paper rolls. Using the provided materials patients were asked to build a launching mechanisms to launch a ping pong ball approximately 12 feet. Patients were divided into teams of 3-5.   Education:Social Skills, Discharge Planning.   Education Outcome: Acknowledges education/In group clarification offered/Needs additional education.   Clinical Observations/Feedback: Pt was pleasant and worked well with his peer.  Pt and peer worked well together to come up with a concept to complete the launcher.  Pt explained they used teamwork to complete the activity.  Pt also stated "some people you have to use when you're down, others you use when you're up".  Pt was explaining that everyone in you support system has a role to play.    Caroll RancherMarjette Tarez Bowns, LRT/CTRS   Caroll RancherLindsay, Rechel Delosreyes A 12/18/2017 11:47 AM

## 2017-12-18 NOTE — [facility] Group Notes (Signed)
LCSW Group Therapy Note  12/18/2017 1:15pm  Type of Therapy/Topic:  Group Therapy:  Balance in Life  Participation Level:  Active  Description of Group:    This group will address the concept of balance and how it feels and looks when one is unbalanced. Patients will be encouraged to process areas in their lives that are out of balance and identify reasons for remaining unbalanced. Facilitators will guide patients in utilizing problem-solving interventions to address and correct the stressor making their life unbalanced. Understanding and applying boundaries will be explored and addressed for obtaining and maintaining a balanced life. Patients will be encouraged to explore ways to assertively make their unbalanced needs known to significant others in their lives, using other group members and facilitator for support and feedback.  Therapeutic Goals: 1. Patient will identify two or more emotions or situations they have that consume much of in their lives. 2. Patient will identify signs/triggers that life has become out of balance:  3. Patient will identify two ways to set boundaries in order to achieve balance in their lives:  4. Patient will demonstrate ability to communicate their needs through discussion and/or role plays  Summary of Patient Progress:  [name] attended the entire session. He identified an emotion that may cause problems. He was unable to identify how the emotion felt with redirection. He was able to state how to manage difficult emotions.  Talked about his use of sarcasm, and also his deflection through the use of humor.   Therapeutic Modalities:   Cognitive Behavioral Therapy Solution-Focused Therapy Assertiveness Training  Ida RogueRodney B Maddyn [place], [place] 12/18/2017 2:30 PM

## 2017-12-18 NOTE — Progress Notes (Signed)
[facility] MD Progress Note  12/18/2017 2:11 PM NUSSEN PULLIN  MRN:  [id number] Subjective:    (Per Md's admission SRA):Patient is a 42 year old male with a past psychiatric history significant for chronic paranoid schizophrenia, seizure disorder, migraine headaches, hypertension, cannabis use disorder as well as alcohol use disorder. He presented to the [facility] emergency department on 12/13/2017. The patient presented there because he was having abdominal pain and his fear for constipation. He has not been able to go to the bathroom for several days, and this caused him to be suicidal. He is also significantly paranoid, and felt as though people were out to get him. He stated he was trying to leave the area over the last 3 months. He had been going to [facility] for outpatient treatment, and had been prescribed Depakote as well as olanzapine. He stated that he had run out of his medications. He stated he had missed his last appointment. He was admitted tothe hospital for evaluation and stabilization.  Today upon evaluation:  Pt shares, "I'm awesome. I feel a difference with my attitude." Pt reports overall improvement of his symptoms of anxiety and paranoia, and he continues to feel that each day he is improving. He has some ongoing AH, but he reports they are minimal and non-intrusive, explaining, "It's like a I catch a glimpse." Pt denies physical complaints. He is sleeping well. His appetite is good. He denies SI/HI/VH. He is tolerating his medication regimen well and he is in agreement to continue his current medications without changes. He feels that he will be safe to discharge as soon as tomorrow. He had no further questions, comments, or concerns.  Principal Problem: Paranoid schizophrenia (HCC) Diagnosis:   Patient Active Problem List   Diagnosis Date Noted  . Localization-related idiopathic epilepsy and epileptic syndromes with seizures of localized onset, not  intractable, without status epilepticus (HCC) [G40.009] 06/21/2017  . Paranoid schizophrenia (HCC) [F20.0] 06/19/2017  . Gastroesophageal reflux disease with esophagitis [K21.0]   . 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 Cannabis abuse with psychotic disorder (HCC) [F12.159] 12/07/2016  . Pericarditis [I31.9] 12/06/2016  . Rhabdomyolysis [M62.82] 12/06/2016  . EtOH dependence (HCC) [F10.20] 12/06/2016  . Transient alteration of awareness [R40.4] 08/28/2016  . Jerky body movements [R25.8] 08/28/2016  . Seizures (HCC) [R56.9] 06/18/2016  . Internal hemorrhoids [K64.8] 09/30/2013  . Acute psychosis (HCC) [F23] 05/29/2006  . TOBACCO DEPENDENCE [F17.200] 05/29/2006   Total Time spent with patient: 30 minutes  Past Psychiatric History: see H&P  Past Medical History:  Past Medical History:  Diagnosis Date  . Alcohol abuse   . Depression   . Hypertension   . Migraines   . Schizophrenia (HCC)   . Seizure (HCC)    alcoho    Past Surgical History:  Procedure Laterality Date  . WISDOM TOOTH EXTRACTION     Family History:  Family History  Problem Relation Age of Onset  . Diabetes Mother   . Hypertension Mother   . Hyperlipidemia Mother   . Anemia Mother   . Heart murmur Mother   . Colon polyps Maternal Uncle   . Heart disease Maternal Uncle   . Celiac disease Maternal Grandmother   . Diabetes Maternal Grandmother   . Diabetes Maternal Grandfather    Family Psychiatric  History: see H&P Social History:  Social History   Substance and Sexual Activity  Alcohol Use Yes     Social History   Substance and Sexual Activity  Drug Use Yes  . Types: Marijuana    Social History   Socioeconomic History  . Marital status: Single    Spouse name: Not on file  . Number of children: 3  . Years of education: Not on file  . Highest education level: Not on file  Occupational History  . Occupation: Unemployed  Social Needs   . Financial resource strain: Not on file  . Food insecurity:    Worry: Not on file    Inability: Not on file  . Transportation needs:    Medical: Not on file    Non-medical: Not on file  Tobacco Use  . Smoking status: Current Some Day Smoker    Packs/day: 1.00    Types: Cigarettes  . Smokeless tobacco: Never Used  Substance and Sexual Activity  . Alcohol use: Yes  . Drug use: Yes    Types: Marijuana  . Sexual activity: Not on file  Lifestyle  . Physical activity:    Days per week: Not on file    Minutes per session: Not on file  . Stress: Not on file  Relationships  . Social connections:    Talks on phone: Not on file    Gets together: Not on file    Attends religious service: Not on file    Active member of club or organization: Not on file    Attends meetings of clubs or organizations: Not on file    Relationship status: Not on file  Other Topics Concern  . Not on file  Social History Narrative  . Not on file   Additional Social History:    Pain Medications: none Prescriptions: none Over the Counter: none History of alcohol / drug use?: Yes                    Sleep: Good  Appetite:  Good  Current Medications: Current Facility-Administered Medications  Medication Dose Route Frequency Provider Last Rate Last Dose  . acetaminophen (TYLENOL) tablet 650 mg  650 mg Oral Q6H PRN Laveda AbbeParks, Laurie Britton, NP   650 mg at 12/15/17 1706  . alum & mag hydroxide-simeth (MAALOX/MYLANTA) 200-200-20 MG/5ML suspension 30 mL  30 mL Oral Q4H PRN Laveda AbbeParks, Laurie Britton, NP      . divalproex (DEPAKOTE ER) 24 hr tablet 500 mg  500 mg Oral QHS Antonieta Pertlary, Greg Lawson, MD   500 mg at 12/17/17 2209  . famotidine (PEPCID) tablet 20 mg  20 mg Oral Daily Laveda AbbeParks, Laurie Britton, NP   20 mg at 12/18/17 0720  . hydrOXYzine (ATARAX/VISTARIL) tablet 25 mg  25 mg Oral TID PRN Laveda AbbeParks, Laurie Britton, NP   25 mg at 12/18/17 0720  . magnesium hydroxide (MILK OF MAGNESIA) suspension 30 mL  30 mL  Oral Daily PRN Laveda AbbeParks, Laurie Britton, NP      . multivitamin with minerals tablet 1 tablet  1 tablet Oral Daily Laveda AbbeParks, Laurie Britton, NP   1 tablet at 12/18/17 0720  . nicotine (NICODERM CQ - dosed in mg/24 hours) patch 21 mg  21 mg Transdermal Daily Laveda AbbeParks, Laurie Britton, NP   21 mg at 12/18/17 0720  . OLANZapine zydis (ZYPREXA) disintegrating tablet 10 mg  10 mg Oral BID Antonieta Pertlary, Greg Lawson, MD   10 mg at 12/18/17 0721  . pantoprazole (PROTONIX) EC tablet 40 mg  40 mg Oral Daily Antonieta Pertlary, Greg Lawson, MD   40 mg at 12/18/17 0720  . polyethylene glycol (MIRALAX / GLYCOLAX) packet 17 g  17 g  Oral Daily PRN Laveda Abbe, NP      . polyethylene glycol (MIRALAX / GLYCOLAX) packet 17 g  17 g Oral Daily Antonieta Pert, MD   17 g at 12/18/17 0720  . traZODone (DESYREL) tablet 50 mg  50 mg Oral QHS PRN Laveda Abbe, NP   50 mg at 12/16/17 2340    Lab Results: No results found for this or any previous visit (from the past 48 hour(s)).  Blood Alcohol level:  Lab Results  Component Value Date   ETH <10 12/12/2017   ETH <10 12/10/2017    Metabolic Disorder Labs: Lab Results  Component Value Date   HGBA1C 5.9 06/19/2017   No results found for: PROLACTIN Lab Results  Component Value Date   CHOL 185 06/19/2017   TRIG 330.0 (H) 06/19/2017   HDL 48.40 06/19/2017   CHOLHDL 4 06/19/2017   VLDL 66.0 (H) 06/19/2017   LDLCALC 92 06/14/2016    Physical Findings: AIMS:  , ,  ,  ,    CIWA:    COWS:     Musculoskeletal: Strength & Muscle Tone: within normal limits Gait & Station: normal Patient leans: N/A  Psychiatric Specialty Exam: Physical Exam  Nursing note and vitals reviewed.   Review of Systems  Constitutional: Negative for chills and fever.  Respiratory: Negative for cough and shortness of breath.   Cardiovascular: Negative for chest pain.  Gastrointestinal: Negative for abdominal pain, heartburn, nausea and vomiting.  Psychiatric/Behavioral: Positive for  hallucinations. Negative for depression and suicidal ideas. The patient is not nervous/anxious and does not have insomnia.     Blood pressure 122/80, pulse 94, temperature 98.6 F (37 C), temperature source Oral, resp. rate 20, height 6\' 4"  (1.93 m), weight 90.7 kg.Body mass index is 24.34 kg/m.  General Appearance: Casual and Fairly Groomed  Eye Contact:  Good  Speech:  Clear and Coherent and Normal Rate  Volume:  Normal  Mood:  Euthymic  Affect:  Congruent, Constricted and Flat  Thought Process:  Coherent and Goal Directed  Orientation:  Full (Time, Place, and Person)  Thought Content:  Hallucinations: Auditory  Suicidal Thoughts:  No  Homicidal Thoughts:  No  Memory:  Immediate;   Fair Recent;   Fair Remote;   Fair  Judgement:  Fair  Insight:  Fair  Psychomotor Activity:  Normal  Concentration:  Concentration: Fair  Recall:  [employer] of Knowledge:  Fair  Language:  Fair  Akathisia:  No  Handed:    AIMS (if indicated):     Assets:  Resilience Social Support  ADL's:  Intact  Cognition:  WNL  Sleep:  Number of Hours: 6     Treatment Plan Summary: Daily contact with patient to assess and evaluate symptoms and progress in treatment and Medication management   -Continue inpatient hospitalization.  -Will continue today 12/18/2017 plan as below except where it is noted.  -Schizophrenia             -Continue Depakote ER 500mg  po qhs             -Continue Zyprexa zydis 10mg  po BID  -anxiety             -Continue vistaril 25mg  po tid prn anxiety  -GERD             -Continue protonix 40mg  po qDay  -insomnia             -Continue trazodone 50mg  po qhs prn insomnia  -  Encourage participation in groups and therapeutic milieu  -disposition planning will be ongoing  Micheal Likens, MD 12/18/2017, 2:11 PM

## 2017-12-19 MED ORDER — HYDROXYZINE HCL 25 MG PO TABS: 25.0000 mg | ORAL_TABLET | Freq: Three times a day (TID) | ORAL | 0 refills | Status: DC | PRN

## 2017-12-19 MED ORDER — TRAZODONE HCL 50 MG PO TABS: 50.0000 mg | ORAL_TABLET | Freq: Every evening | ORAL | 0 refills | Status: AC | PRN

## 2017-12-19 MED ORDER — POLYETHYLENE GLYCOL 3350 17 G PO PACK: 17.0000 g | PACK | Freq: Every day | ORAL | 0 refills | Status: DC | PRN

## 2017-12-19 MED ORDER — OLANZAPINE 10 MG PO TBDP: 10.0000 mg | ORAL_TABLET | Freq: Two times a day (BID) | ORAL | 0 refills | Status: AC

## 2017-12-19 MED ORDER — DIVALPROEX SODIUM ER 500 MG PO TB24: 500.0000 mg | ORAL_TABLET | Freq: Every day | ORAL | 0 refills | Status: DC

## 2017-12-19 MED ORDER — OMEPRAZOLE 40 MG PO CPDR: DELAYED_RELEASE_CAPSULE | ORAL | 0 refills | Status: DC

## 2017-12-19 MED ORDER — FAMOTIDINE 20 MG PO TABS: 20.0000 mg | ORAL_TABLET | Freq: Every day | ORAL | 0 refills | Status: DC

## 2017-12-19 NOTE — Progress Notes (Signed)
  [facility] :  Will you be returning to the same living situation after discharge:  Yes,  home At discharge, do you have transportation home?: Yes,  family Do you have the ability to pay for your medications: Yes,  insurance  Release of information consent forms completed and in the chart;  Patient's signature needed at discharge.  Patient to Follow up at: Follow-up Information    [facility] Follow up on 12/26/2017.   Why:  Friday at 8;30 for your hospital follow up appointment with Merlyn AlbertFred. Contact information: [address] [place] [address] [phone]           Next level of care provider [facility] Link:no  Safety Planning and Suicide Prevention discussed: Yes,  yes  Have you used any form of tobacco in the last 30 days? (Cigarettes, Smokeless Tobacco, Cigars, and/or Pipes): Yes  Has patient been referred to the Quitline?: Patient refused referral  Patient has been referred for addiction treatment: N/A  Ida RogueRodney B Evelyne Makepeace, LCSW 12/19/2017, 11:40 AM

## 2017-12-19 NOTE — Progress Notes (Signed)
D: Pt A & O X 4. Denies SI, HI, AVH and pain at this time. Rates his depression, hopelessness and anxiety all 0/10. Reports he's sleeping well with good appetite, normal energy and good concentration level.  D/C home as ordered. Picked up in lobby by his mother. A: D/C instructions reviewed with pt including prescriptions, medication samples and follow up appointments compliance encouraged. All belongings from locker # 46 given to pt at time of departure. Scheduled medications given with verbal education and effects monitored. Safety checks maintained without incident till time of d/c.  R: Pt receptive to care. Compliant with medications when offered. Denies adverse drug reactions when assessed. Verbalized understanding related to d/c instructions. Signed belonging sheet in agreement with items received from locker. Ambulatory with a steady gait. Appears to be in no physical distress at time of departure.

## 2017-12-19 NOTE — Plan of Care (Signed)
Pt attended and participated in recreation therapy group sessions.   Loukas Antonson, LRT/CTRS 

## 2017-12-19 NOTE — Progress Notes (Signed)
Recreation Therapy Notes  Date: 9.20.19 Time: 1000 Location: 500 Hall  Group Topic: Communication  Goal Area(s) Addresses:  Patient will effectively communicate with peers in group.  Patient will verbalize benefit of healthy communication.   Behavioral Response: Engaged  Intervention: Veterinary surgeonubber Discs  Activity: Hot Lava.  Each patient was given a rubber disc and the group was given one extra disc.  Patients were to use the discs to get each person from one end of the hall to the other and back to the starting point.    Education: Communication, Discharge Planning  Education Outcome: Acknowledges understanding/In group clarification offered/Needs additional education.   Clinical Observations/Feedback:  Pt was the leader of the group.  Pt encouraged his peers as the activity progressed.  Pt would also make adjustments when he realized he tossed the disc out to far for the rest of his team.  Pt made sure to toss the disc at a shorter distance so everyone could cross.  Pt showed patience with peers who may not have understood what to do.      Caroll RancherMarjette Deeana Atwater, LRT/CTRS         Lillia AbedLindsay, Theodis Kinsel A 12/19/2017 1:02 PM

## 2017-12-19 NOTE — Discharge Summary (Addendum)
Physician Discharge Summary Note  Patient:  Alfred [name] is an 42 y.o., male MRN:  [id number] DOB:  [birth year] Patient phone:  [phone] (home)  Patient address:   [address] [place] [address],   Total Time spent with patient: Greater than 30 minutes  Date of Admission:  12/13/2017  Date of Discharge: 12-19-17  Reason for Admission: Paranoia & suicidal ideations.   Principal Problem: Paranoid schizophrenia [facility](HCC)  Discharge Diagnoses: Patient Active Problem List   Diagnosis Date Noted  . Paranoid schizophrenia (HCC) [F20.0] 06/19/2017    Priority: High  . 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 Gastroesophageal reflux disease with esophagitis [K21.0]   . 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 Cannabis abuse with psychotic disorder (HCC) [F12.159] 12/07/2016  . Pericarditis [I31.9] 12/06/2016  . Rhabdomyolysis [M62.82] 12/06/2016  . EtOH dependence (HCC) [F10.20] 12/06/2016  . Transient alteration of awareness [R40.4] 08/28/2016  . Jerky body movements [R25.8] 08/28/2016  . Seizures (HCC) [R56.9] 06/18/2016  . Internal hemorrhoids [K64.8] 09/30/2013  . Acute psychosis (HCC) [F23] 05/29/2006  . TOBACCO DEPENDENCE [F17.200] 05/29/2006   Past Psychiatric History: Schizphrenia.  Past Medical History:  Past Medical History:  Diagnosis Date  . Alcohol abuse   . Depression   . Hypertension   . Migraines   . Schizophrenia (HCC)   . Seizure (HCC)    alcoho    Past Surgical History:  Procedure Laterality Date  . WISDOM TOOTH EXTRACTION     Family History:  Family History  Problem Relation Age of Onset  . Diabetes Mother   . Hypertension Mother   . Hyperlipidemia Mother   . Anemia Mother   . Heart murmur Mother   . Colon polyps Maternal Uncle   . Heart disease  Maternal Uncle   . Celiac disease Maternal Grandmother   . Diabetes Maternal Grandmother   . Diabetes Maternal Grandfather    Family Psychiatric  History: See H&P.  Social History:  Social History   Substance and Sexual Activity  Alcohol Use Yes     Social History   Substance and Sexual Activity  Drug Use Yes  . Types: Marijuana    Social History   Socioeconomic History  . Marital status: Single    Spouse name: Not on file  . Number of children: 3  . Years of education: Not on file  . Highest education level: Not on file  Occupational History  . Occupation: Unemployed  Social Needs  . Financial resource strain: Not on file  . Food insecurity:    Worry: Not on file    Inability: Not on file  . Transportation needs:    Medical: Not on file    Non-medical: Not on file  Tobacco Use  . Smoking status: Current Some Day Smoker    Packs/day: 1.00    Types: Cigarettes  . Smokeless tobacco: Never Used  Substance and Sexual Activity  . Alcohol use: Yes  . Drug use: Yes    Types: Marijuana  . Sexual activity: Not on file  Lifestyle  . Physical activity:    Days per week: Not on file    Minutes per session: Not on file  . Stress: Not on file  Relationships  . Social connections:    Talks on phone: Not on file    Gets together: Not on file    Attends religious service: Not on  file    Active member of club or organization: Not on file    Attends meetings of clubs or organizations: Not on file    Relationship status: Not on file  Other Topics Concern  . Not on file  Social History Narrative  . Not on file   Hospital Course: (Per Md's admission SRA):Patient is a 42 year old male with a past psychiatric history significant for chronic paranoid schizophrenia, seizure disorder, migraine headaches, hypertension, cannabis use disorder as well as alcohol use disorder. He presented to the [facility] emergency department on 12/13/2017. The patient  presented there because he was having abdominal pain and his fear for constipation. He has not been able to go to the bathroom for several days, and this caused him to be suicidal. He is also significantly paranoid, and felt as though people were out to get him. He stated he was trying to leave the area over the last 3 months. He had been going to [facility] for outpatient treatment, and had been prescribed Depakote as well as olanzapine. He stated that he had run out of his medications. He stated he had missed his last appointment. He was admitted tothe hospital for evaluation and stabilization.  (Per discharge SRA): Today upon his discharge evaluation: Pt shares, "I'm good. I'm a little anxious about going home. Last time I was home there were snakes everywhere and crawling up my leg." Pt is unsure if he could have someone come home with him at least initially, but we talked about the process of going slowly and walking through his apartment to check for things that are concerning, reality testing, and using his coping skills.  Pt reports that he is doing well overall in the hospital. He denies SI/HI/AH/VH. He is sleeping well. His appetite is good. He denies physical complaints. He is tolerating his medications well and he feels that they have been helpful. He is in agreement to continue his current regimen without changes. He was able to engage in safety planning including plan to return to [facility] or contact emergency services if he feels unable to maintain his own safety or the safety of others. Pt had no further questions, comments, or concerns.  Plan Of Care/Follow-up recommendations:   -Discharge to outpatient level of care  -Schizophrenia -Continue Depakote ER 500mg  po qhs -Continue Zyprexa zydis 10mg  po BID  -anxiety -Continue vistaril 25mg  po tid prn anxiety  -GERD -Continue protonix 40mg  po qDay  -insomnia -Continue  trazodone 50mg  po qhs prn insomnia  Activity:  as tolerated Diet:  normal Tests:  NA Other:  see above for DC plan  Physical Findings: AIMS:  , ,  ,  ,    CIWA:    COWS:     Musculoskeletal: Strength & Muscle Tone: within normal limits Gait & Station: normal Patient leans: N/A  Psychiatric Specialty Exam: Physical Exam  Constitutional: He appears well-developed and well-nourished.  HENT:  Head: Normocephalic.  Eyes: Pupils are equal, round, and reactive to light.  Neck: Normal range of motion.  Cardiovascular: Normal rate.  Respiratory: Effort normal.  GI: Soft.    Review of Systems  Constitutional: Negative.   HENT: Negative.   Eyes: Negative.   Respiratory: Negative.   Cardiovascular: Negative.   Gastrointestinal: Negative.   Genitourinary: Negative.   Musculoskeletal: Negative.   Skin: Negative.   Neurological: Negative.   Endo/Heme/Allergies: Negative.   Psychiatric/Behavioral: Positive for substance abuse (Hx. THC use). Negative for depression, memory loss and suicidal ideas. Hallucinations:  Hx. psychosis. The patient has insomnia (Stable). The patient is not nervous/anxious.     Blood pressure 105/80, pulse 94, temperature 98.4 F (36.9 C), resp. rate 16, height 6\' 4"  (1.93 m), weight 90.7 kg.Body mass index is 24.34 kg/m.  See Md's SRA   Have you used any form of tobacco in the last 30 days? (Cigarettes, Smokeless Tobacco, Cigars, and/or Pipes): Yes  Has this patient used any form of tobacco in the last 30 days? (Cigarettes, Smokeless Tobacco, Cigars, and/or Pipes): N/A  Blood Alcohol level:  Lab Results  Component Value Date   ETH <10 12/12/2017   ETH <10 12/10/2017   Metabolic Disorder Labs:  Lab Results  Component Value Date   HGBA1C 5.9 06/19/2017   No results found for: PROLACTIN Lab Results  Component Value Date   CHOL 185 06/19/2017   TRIG 330.0 (H) 06/19/2017   HDL 48.40 06/19/2017   CHOLHDL 4 06/19/2017   VLDL 66.0 (H) 06/19/2017    LDLCALC 92 06/14/2016   See Psychiatric Specialty Exam and Suicide Risk Assessment completed by Attending Physician prior to discharge.  Discharge destination:  Home  Is patient on multiple antipsychotic therapies at discharge:  No   Has Patient had three or more failed trials of antipsychotic monotherapy by history:  No  Recommended Plan for Multiple Antipsychotic Therapies: NA  Allergies as of 12/19/2017   No Known Allergies     Medication List    STOP taking these medications   acetaminophen 325 MG tablet Commonly known as:  TYLENOL   multivitamin with minerals Tabs tablet   nicotine 21 mg/24hr patch Commonly known as:  NICODERM CQ - dosed in mg/24 hours   OLANZapine 10 MG tablet Commonly known as:  ZYPREXA Replaced by:  OLANZapine zydis 10 MG disintegrating tablet   ranitidine 150 MG tablet Commonly known as:  ZANTAC Replaced by:  famotidine 20 MG tablet     TAKE these medications     Indication  divalproex 500 MG 24 hr tablet Commonly known as:  DEPAKOTE ER Take 1 tablet (500 mg total) by mouth at bedtime. For mood stabilization What changed:    how much to take  additional instructions  Indication:  Mood stabilization   famotidine 20 MG tablet Commonly known as:  PEPCID Take 1 tablet (20 mg total) by mouth daily. Start taking on:  12/20/2017 Replaces:  ranitidine 150 MG tablet  Indication:  Gastroesophageal Reflux Disease   hydrOXYzine 25 MG tablet Commonly known as:  ATARAX/VISTARIL Take 1 tablet (25 mg total) by mouth 3 (three) times daily as needed for anxiety.  Indication:  Feeling Anxious   OLANZapine zydis 10 MG disintegrating tablet Commonly known as:  ZYPREXA Take 1 tablet (10 mg total) by mouth 2 (two) times daily. For mood control Replaces:  OLANZapine 10 MG tablet  Indication:  Mood control   omeprazole 40 MG capsule Commonly known as:  PRILOSEC TAKE 1 CAPSULE BY MOUTH EVERY DAY: For acid reflux What changed:    how much to  take  how to take this  when to take this  additional instructions  Another medication with the same name was removed. Continue taking this medication, and follow the directions you see here.  Indication:  Gastroesophageal Reflux Disease   polyethylene glycol packet Commonly known as:  MIRALAX / GLYCOLAX Take 17 g by mouth daily as needed for moderate constipation. (May buy from over the counter): For constipation What changed:  additional instructions  Indication:  Constipation  traZODone 50 MG tablet Commonly known as:  DESYREL Take 1 tablet (50 mg total) by mouth at bedtime as needed for sleep.  Indication:  Trouble Sleeping      Follow-up Information    [facility] Follow up on 12/26/2017.   Why:  Friday at 8;30 for your hospital follow up appointment with Merlyn Albert. Contact information: [address] [place] [address] [phone]          Follow-up recommendations: Activity:  As tolerated Diet: As recommended by your primary care doctor. Keep all scheduled follow-up appointments as recommended.    Comments: Patient is instructed prior to discharge to: Take all medications as prescribed by his/her mental healthcare provider. Report any adverse effects and or reactions from the medicines to his/her outpatient provider promptly. Patient has been instructed & cautioned: To not engage in alcohol and or illegal drug use while on prescription medicines. In the event of worsening symptoms, patient is instructed to call the crisis hotline, 911 and or go to the nearest ED for appropriate evaluation and treatment of symptoms. To follow-up with his/her primary care provider for your other medical issues, concerns and or health care needs.   Signed: Armandina Stammer, NP, PMHNP, FNP-BC 12/19/2017, 10:16 AM   Patient seen, Suicide Assessment Completed.  Disposition Plan Reviewed

## 2017-12-19 NOTE — [facility] Suicide Risk Assessment (Signed)
[facility]   Principal Problem: Paranoid schizophrenia [facility](HCC) Discharge Diagnoses:  Patient Active Problem List   Diagnosis Date Noted  . 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 Paranoid schizophrenia (HCC) [F20.0] 06/19/2017  . Gastroesophageal reflux disease with esophagitis [K21.0]   . 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 Cannabis abuse with psychotic disorder (HCC) [F12.159] 12/07/2016  . Pericarditis [I31.9] 12/06/2016  . Rhabdomyolysis [M62.82] 12/06/2016  . EtOH dependence (HCC) [F10.20] 12/06/2016  . Transient alteration of awareness [R40.4] 08/28/2016  . Jerky body movements [R25.8] 08/28/2016  . Seizures (HCC) [R56.9] 06/18/2016  . Internal hemorrhoids [K64.8] 09/30/2013  . Acute psychosis (HCC) [F23] 05/29/2006  . TOBACCO DEPENDENCE [F17.200] 05/29/2006    Total Time spent with patient: 30 minutes  Musculoskeletal: Strength & Muscle Tone: within normal limits Gait & Station: normal Patient leans: N/A  Psychiatric Specialty Exam: Review of Systems  Constitutional: Negative for chills and fever.  Respiratory: Negative for cough and shortness of breath.   Cardiovascular: Negative for chest pain.  Gastrointestinal: Negative for abdominal pain, heartburn, nausea and vomiting.  Psychiatric/Behavioral: Negative for depression, hallucinations and suicidal ideas. The patient is nervous/anxious. The patient does not have insomnia.     Blood pressure 105/80, pulse 94, temperature 98.4 F (36.9 C), resp. rate 16, height 6\' 4"  (1.93 m), weight 90.7 kg.Body mass index is 24.34 kg/m.  General Appearance: Casual and Fairly Groomed  Patent attorneyye Contact::  Good  Speech:  Clear and Coherent and Normal Rate  Volume:  Normal  Mood:  Euthymic  Affect:  Flat  Thought  Process:  Coherent and Goal Directed  Orientation:  Full (Time, Place, and Person)  Thought Content:  Logical  Suicidal Thoughts:  No  Homicidal Thoughts:  No  Memory:  Immediate;   Fair Recent;   Fair Remote;   Fair  Judgement:  Poor  Insight:  Lacking  Psychomotor Activity:  Normal  Concentration:  Fair  Recall:  FiservFair  Fund of Knowledge:Fair  Language: Fair  Akathisia:  No  Handed:    AIMS (if indicated):     Assets:  Resilience Social Support  Sleep:  Number of Hours: 6  Cognition: WNL  ADL's:  Intact   Mental Status Per Nursing Assessment::   On Admission:  NA  Demographic Factors:  Male, Low socioeconomic status, Living alone and Unemployed  Loss Factors: Financial problems/change in socioeconomic status  Historical Factors: Impulsivity  Risk Reduction Factors:   Positive social support, Positive therapeutic relationship and Positive coping skills or problem solving skills  Continued Clinical Symptoms:  Schizophrenia:   Paranoid or undifferentiated type  Cognitive Features That Contribute To Risk:  None    Suicide Risk:  Minimal: No identifiable suicidal ideation.  Patients presenting with no risk factors but with morbid ruminations; may be classified as minimal risk based on the severity of the depressive symptoms  Follow-up Information    [facility] Follow up on 12/26/2017.   Why:  Friday at 8;30 for your hospital follow up appointment with Merlyn AlbertFred. Contact information: [address] [place] [address] [phone]         Subjective Data:  (Per Md's admission SRA):Patient is a 42 year old male with a past psychiatric history significant for chronic paranoid schizophrenia, seizure disorder, migraine headaches, hypertension, cannabis use disorder as well as alcohol use disorder. [facility]  [facility]13/[facility]. The patient presented there because he was having abdominal pain and his fear for  constipation. He has not been able to go to the bathroom for several days, and this caused him to be suicidal. He is also significantly paranoid, and felt as though people were out to get him. He stated he was trying to leave the area over the last 3 months. He had been going to [facility] for outpatient treatment, and had been prescribed Depakote as well as olanzapine. He stated that he had run out of his medications. He stated he had missed his last appointment. He was admitted tothe hospital for evaluation and stabilization.  Today upon evaluation: Pt shares, "I'm good. I'm a little anxious about going home. Last time I was home there were snakes everywhere and crawling up my leg." Pt is unsure if he could have someone come home with him at least initially, but we talked about the process of going slowly and walking through his apartment to check for things that are concerning, reality testing, and using his coping skills.  Pt reports that he is doing well overall in the hospital. He denies SI/HI/AH/VH. He is sleeping well. His appetite is good. He denies physical complaints. He is tolerating his medications well and he feels that they have been helpful. He is in agreement to continue his current regimen without changes. He was able to engage in safety planning including plan to return to [facility] or contact emergency services if he feels unable to maintain his own safety or the safety of others. Pt had no further questions, comments, or concerns.   Plan Of Care/Follow-up recommendations:   -Discharge to outpatient level of care  -Schizophrenia -Continue Depakote ER 500mg  po qhs -Continue Zyprexa zydis 10mg  po BID  -anxiety -Continue vistaril 25mg  po tid prn anxiety  -GERD -Continue protonix 40mg  po qDay  -insomnia -Continue trazodone 50mg  po qhs prn insomnia  Activity:  as tolerated Diet:  normal Tests:  NA Other:  see  above for DC plan  Micheal Likens, MD 12/19/2017, 10:30 AM

## 2017-12-19 NOTE — Progress Notes (Signed)
Recreation Therapy Notes  INPATIENT RECREATION TR PLAN  Patient Details Name: Alfred [name] MRN: [id number] DOB: 08-10-1975 Today's Date: 12/19/2017  Rec Therapy Plan Is patient appropriate for Therapeutic Recreation?: Yes Treatment times per week: about 3 days Estimated Length of Stay: 5-7 days TR Treatment/Interventions: Group participation (Comment)  Discharge Criteria Pt will be discharged from therapy if:: Discharged Treatment plan/goals/alternatives discussed and agreed upon by:: Patient/family  Discharge Summary Short term goals set: See patient care plan Short term goals met: Complete Progress toward goals comments: Groups attended Which groups?: Wellness, Communication, Coping skills, Other (Comment)(Team building) Reason goals not met: None Therapeutic equipment acquired: N/A Reason patient discharged from therapy: Discharge from hospital Pt/family agrees with progress & goals achieved: Yes Date patient discharged from therapy: 12/19/17    Alfred [name], LRT/CTRS  Ria Comment, Izumi Mixon A 12/19/2017, 11:17 AM

## 2017-12-24 ENCOUNTER — Ambulatory Visit (INDEPENDENT_AMBULATORY_CARE_PROVIDER_SITE_OTHER): Payer: Medicare HMO | Admitting: Adult Health

## 2017-12-24 ENCOUNTER — Encounter: Payer: Self-pay | Admitting: Adult Health

## 2017-12-24 VITALS — BP 138/64 | HR 84 | Temp 98.2°F | Wt 211.2 lb

## 2017-12-24 DIAGNOSIS — R748 Abnormal levels of other serum enzymes: Secondary | ICD-10-CM | POA: Diagnosis not present

## 2017-12-24 DIAGNOSIS — F23 Brief psychotic disorder: Secondary | ICD-10-CM

## 2017-12-24 DIAGNOSIS — R69 Illness, unspecified: Secondary | ICD-10-CM | POA: Diagnosis not present

## 2017-12-24 LAB — CK: CK TOTAL: 82 U/L (ref 7–232)

## 2017-12-24 NOTE — Progress Notes (Signed)
Subjective:    Patient ID: Alfred [name], male    DOB: Oct 21, 1975, 42 y.o.   MRN: [id number]  HPI  42 year old male who  has a past medical history of Alcohol abuse, Depression, Hypertension, Migraines, Schizophrenia (HCC), and Seizure (HCC).   He presents to the office today for TCM visit   Admit Date: 12/13/2017  Discharge Date 12/19/2017   Vented to the emergency department on 12/13/2017 for complaint of abdominal pain and fear of constipation.  He reported he was unable to use the bathroom for several days and this caused him to become suicidal.  He also was significantly paranoid and felt as though people were out to get him.  He stated to the emergency room that he had run out of medications and missed his last appointment with psychiatry he was admitted to the hospital for evaluation and stabilization  Was stabilized on Depakote and Zyprexa for schizophrenia.  Continued Vistaril 25 mg 3 times daily for anxiety, continue Protonix 40 mg daily and continue trazodone 50 mg nightly as needed for insomnia.  Day in the office he reports that he feels much better than when he went into the emergency room.  He has been taking his medications as directed, and denies any visual or auditory hallucinations.  He reports that his mood is "up and down".  He does report that he is sleeping since getting trazodone, unfortunately he has been waking up around 4 5 AM.  Is an appointment with neurology and psychiatry 2 days  No acute complaints  Review of Systems  Constitutional: Negative.   Respiratory: Negative.   Cardiovascular: Negative.   Gastrointestinal: Negative.   Musculoskeletal: Negative.   Psychiatric/Behavioral: Positive for decreased concentration and sleep disturbance. Negative for self-injury and suicidal ideas. The patient is nervous/anxious. The patient is not hyperactive.   All other systems reviewed and are negative.  Past Medical History:  Diagnosis Date  . Alcohol abuse     . Depression   . Hypertension   . Migraines   . Schizophrenia (HCC)   . Seizure (HCC)    alcoho    Social History   Socioeconomic History  . Marital status: Single    Spouse name: Not on file  . Number of children: 3  . Years of education: Not on file  . Highest education level: Not on file  Occupational History  . Occupation: Unemployed  Social Needs  . Financial resource strain: Not on file  . Food insecurity:    Worry: Not on file    Inability: Not on file  . Transportation needs:    Medical: Not on file    Non-medical: Not on file  Tobacco Use  . Smoking status: Current Some Day Smoker    Packs/day: 1.00    Types: Cigarettes  . Smokeless tobacco: Never Used  Substance and Sexual Activity  . Alcohol use: Yes  . Drug use: Yes    Types: Marijuana  . Sexual activity: Not on file  Lifestyle  . Physical activity:    Days per week: Not on file    Minutes per session: Not on file  . Stress: Not on file  Relationships  . Social connections:    Talks on phone: Not on file    Gets together: Not on file    Attends religious service: Not on file    Active member of club or organization: Not on file    Attends meetings of clubs or organizations: Not on  file    Relationship status: Not on file  . Intimate partner violence:    Fear of current or ex partner: Not on file    Emotionally abused: Not on file    Physically abused: Not on file    Forced sexual activity: Not on file  Other Topics Concern  . Not on file  Social History Narrative  . Not on file    Past Surgical History:  Procedure Laterality Date  . WISDOM TOOTH EXTRACTION      Family History  Problem Relation Age of Onset  . Diabetes Mother   . Hypertension Mother   . Hyperlipidemia Mother   . Anemia Mother   . Heart murmur Mother   . Colon polyps Maternal Uncle   . Heart disease Maternal Uncle   . Celiac disease Maternal Grandmother   . Diabetes Maternal Grandmother   . Diabetes Maternal  Grandfather     No Known Allergies  Current Outpatient Medications on File Prior to Visit  Medication Sig Dispense Refill  . famotidine (PEPCID) 20 MG tablet Take 1 tablet (20 mg total) by mouth daily. 30 tablet 0  . hydrOXYzine (ATARAX/VISTARIL) 25 MG tablet Take 1 tablet (25 mg total) by mouth 3 (three) times daily as needed for anxiety. 60 tablet 0  . OLANZapine zydis (ZYPREXA) 10 MG disintegrating tablet Take 1 tablet (10 mg total) by mouth 2 (two) times daily. For mood control 60 tablet 0  . omeprazole (PRILOSEC) 40 MG capsule TAKE 1 CAPSULE BY MOUTH EVERY DAY: For acid reflux 30 capsule 0  . polyethylene glycol (MIRALAX / GLYCOLAX) packet Take 17 g by mouth daily as needed for moderate constipation. (May buy from over the counter): For constipation 14 each 0  . traZODone (DESYREL) 50 MG tablet Take 1 tablet (50 mg total) by mouth at bedtime as needed for sleep. 30 tablet 0  . divalproex (DEPAKOTE ER) 500 MG 24 hr tablet Take 1 tablet (500 mg total) by mouth at bedtime. For mood stabilization (Patient not taking: Reported on 12/24/2017) 30 tablet 0   No current facility-administered medications on file prior to visit.     BP 138/64 (BP Location: Left Arm, Patient Position: Sitting, Cuff Size: Normal)   Pulse 84   Temp 98.2 F (36.8 C) (Oral)   Wt 211 lb 3.2 oz (95.8 kg)   SpO2 96%   BMI 25.71 kg/m       Objective:   Physical Exam  Constitutional: He is oriented to person, place, and time. He appears well-developed and well-nourished. No distress.  Cardiovascular: Normal rate, regular rhythm, normal heart sounds and intact distal pulses.  Pulmonary/Chest: Effort normal and breath sounds normal.  Neurological: He is alert and oriented to person, place, and time.  Skin: Skin is warm and dry. He is not diaphoretic.  Psychiatric: He has a normal mood and affect. His speech is normal and behavior is normal. Judgment and thought content normal. He is not actively hallucinating.    Nursing note and vitals reviewed.     Assessment & Plan:  Keep him on current medications until he is followed up with psychiatry.  We will recheck CK levels today as his CK in the hospital upon discharge was still slightly elevated at 600.  He was advised to follow-up as needed and to return to the emergency department if he has any thoughts of suicide or self-harm  Shirline Frees, NP

## 2017-12-26 ENCOUNTER — Other Ambulatory Visit: Payer: Self-pay

## 2017-12-26 ENCOUNTER — Encounter

## 2017-12-26 ENCOUNTER — Ambulatory Visit (INDEPENDENT_AMBULATORY_CARE_PROVIDER_SITE_OTHER): Payer: Medicare HMO | Admitting: Neurology

## 2017-12-26 ENCOUNTER — Inpatient Hospital Stay: Payer: Self-pay | Admitting: Adult Health

## 2017-12-26 VITALS — BP 106/70 | HR 91 | Ht 76.0 in | Wt 211.0 lb

## 2017-12-26 DIAGNOSIS — G40009 Localization-related (focal) (partial) idiopathic epilepsy and epileptic syndromes with seizures of localized onset, not intractable, without status epilepticus: Secondary | ICD-10-CM

## 2017-12-26 DIAGNOSIS — R69 Illness, unspecified: Secondary | ICD-10-CM | POA: Diagnosis not present

## 2017-12-26 DIAGNOSIS — F200 Paranoid schizophrenia: Secondary | ICD-10-CM | POA: Diagnosis not present
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
## 2017-12-26 NOTE — Patient Instructions (Signed)
1. Continue all your medications 2. Follow-up in 6 months, call for any changes  Seizure Precautions: 1. If medication has been prescribed for you to prevent seizures, take it exactly as directed.  Do not stop taking the medicine without talking to your doctor first, even if you have not had a seizure in a long time.   2. Avoid activities in which a seizure would cause danger to yourself or to others.  Don't operate dangerous machinery, swim alone, or climb in high or dangerous places, such as on ladders, roofs, or girders.  Do not drive unless your doctor says you may.  3. If you have any warning that you may have a seizure, lay down in a safe place where you can't hurt yourself.    4.  No driving for 6 months from last seizure, as per [place] state law.   Please refer to the following link on the Epilepsy Foundation of America's website for more information: http://www.epilepsyfoundation.org/answerplace/Social/driving/drivingu.cfm   5.  Maintain good sleep hygiene. Avoid alcohol.  6.  Contact your doctor if you have any problems that may be related to the medicine you are taking.  7.  Call 911 and bring the patient back to the ED if:        A.  The seizure lasts longer than 5 minutes.       B.  The patient doesn't awaken shortly after the seizure  C.  The patient has new problems such as difficulty seeing, speaking or moving  D.  The patient was injured during the seizure  E.  The patient has a temperature over 102 F (39C)  F.  The patient vomited and now is having trouble breathing         

## 2017-12-29 ENCOUNTER — Encounter: Payer: Self-pay | Admitting: Neurology

## 2018-01-09 ENCOUNTER — Telehealth: Payer: Self-pay | Admitting: Adult Health

## 2018-01-09 DIAGNOSIS — K2100 Gastro-esophageal reflux disease with esophagitis, without bleeding: Secondary | ICD-10-CM

## 2018-01-09 MED ORDER — POLYETHYLENE GLYCOL 3350 17 G PO PACK: 17.0000 g | PACK | Freq: Every day | ORAL | 4 refills | Status: DC | PRN

## 2018-01-09 MED ORDER — HYDROXYZINE HCL 25 MG PO TABS: 25.0000 mg | ORAL_TABLET | Freq: Three times a day (TID) | ORAL | 3 refills | Status: DC | PRN

## 2018-01-09 MED ORDER — OMEPRAZOLE 40 MG PO CPDR: DELAYED_RELEASE_CAPSULE | ORAL | 3 refills | Status: DC

## 2018-01-09 MED ORDER — FAMOTIDINE 20 MG PO TABS: 20.0000 mg | ORAL_TABLET | Freq: Every day | ORAL | 3 refills | Status: DC

## 2018-01-09 NOTE — Telephone Encounter (Signed)
Spoke to [name] and informed her that all medication has been sent in.  Stop Miralax if diarrhea starts.  Nothing further needed at this time.

## 2018-01-09 NOTE — Telephone Encounter (Signed)
Copied from CRM [id number]. Topic: Quick Communication - Rx Refill/Question >> Jan 09, 2018  8:15 AM Maia Petties wrote: Medication: polyethylene glycol (MIRALAX / GLYCOLAX) packet - mother is not sure if pt is to continue using miralax - she states she purchased OTC pt gets pill packs from [facility] - mother requesting refills on famotidine (PEPCID) 20 MG tablet, hydrOXYzine (ATARAX/VISTARIL) 25 MG tablet, omeprazole (PRILOSEC) 40 MG capsule - she states she isn't sure how much pt has left - she states she spoke to [facility] 01/08/18 and is requesting a call back from her  Has the patient contacted their pharmacy? Yes - no refills - prescribed from the hospital she thinks Preferred Pharmacy (with phone number or street name): [facility] - [place], [place] - [address] [phone] (Phone) [phone] (Fax)

## 2018-01-09 NOTE — Telephone Encounter (Signed)
See below message.  Should pt continue taking Miralax?  How long do you want me to fill the other medications?

## 2018-01-09 NOTE — Telephone Encounter (Signed)
Ok to fill medications for one year   He can take Miralax daily PRN for constipation. If having diarrhea can decrease to every other day

## 2018-02-18 DIAGNOSIS — R69 Illness, unspecified: Secondary | ICD-10-CM | POA: Diagnosis not present

## 2018-03-09 ENCOUNTER — Other Ambulatory Visit: Payer: Self-pay | Admitting: Adult Health

## 2018-03-09 DIAGNOSIS — K2100 Gastro-esophageal reflux disease with esophagitis, without bleeding: Secondary | ICD-10-CM

## 2018-03-09 NOTE — Telephone Encounter (Signed)
Copied from CRM [id number]. Topic: Quick Communication - Rx Refill/Question >> Mar 09, 2018 10:36 AM Jolayne Hainesaylor, Brittany L wrote: Medication: famotidine (PEPCID) 20 MG tablet , omeprazole (PRILOSEC) 40 MG capsule,   Has the patient contacted their pharmacy? Yes, pharmacy said they do not have a script for these on file. Please re-send so they can fill the pill packs. Both were printed on 10/11.  (Agent: If no, request that the patient contact the pharmacy for the refill.) (Agent: If yes, when and what did the pharmacy advise?)  Preferred Pharmacy (with phone number or street name): [facility] - [place] - [address] [place] [phone]    Agent: Please be advised that RX refills may take up to 3 business days. We ask that you follow-up with your pharmacy.

## 2018-03-11 MED ORDER — OMEPRAZOLE 40 MG PO CPDR: DELAYED_RELEASE_CAPSULE | ORAL | 0 refills | Status: DC

## 2018-03-11 MED ORDER — FAMOTIDINE 20 MG PO TABS: 20.0000 mg | ORAL_TABLET | Freq: Every day | ORAL | 0 refills | Status: DC

## 2018-03-12 ENCOUNTER — Telehealth: Payer: Self-pay | Admitting: Adult Health

## 2018-03-12 DIAGNOSIS — K2100 Gastro-esophageal reflux disease with esophagitis, without bleeding: Secondary | ICD-10-CM

## 2018-03-12 MED ORDER — OMEPRAZOLE 40 MG PO CPDR: DELAYED_RELEASE_CAPSULE | ORAL | 0 refills | Status: DC

## 2018-03-12 MED ORDER — FAMOTIDINE 20 MG PO TABS: 20.0000 mg | ORAL_TABLET | Freq: Every day | ORAL | 0 refills | Status: DC

## 2018-03-12 NOTE — Telephone Encounter (Signed)
pts mother came in today for appt and stated that he has not been able to get his medications.  The pepcid and the prilosec was [facility] but needs to be sent to [facility].  Please advise. thanks

## 2018-03-12 NOTE — Telephone Encounter (Signed)
Resent to the correct pharmacy.  Nothing further needed at this time.

## 2018-05-08 DIAGNOSIS — R69 Illness, unspecified: Secondary | ICD-10-CM | POA: Diagnosis not present

## 2018-05-23 ENCOUNTER — Other Ambulatory Visit: Payer: Self-pay | Admitting: Adult Health

## 2018-05-23 DIAGNOSIS — K2100 Gastro-esophageal reflux disease with esophagitis, without bleeding: Secondary | ICD-10-CM

## 2018-08-17 ENCOUNTER — Encounter: Payer: Self-pay | Admitting: Neurology

## 2018-08-17 ENCOUNTER — Other Ambulatory Visit: Payer: Self-pay | Admitting: Neurology

## 2018-08-17 ENCOUNTER — Telehealth (INDEPENDENT_AMBULATORY_CARE_PROVIDER_SITE_OTHER): Payer: Medicare HMO | Admitting: Neurology

## 2018-08-17 ENCOUNTER — Other Ambulatory Visit: Payer: Self-pay

## 2018-08-17 VITALS — Ht 75.0 in | Wt 221.0 lb

## 2018-08-17 DIAGNOSIS — G40309 Generalized idiopathic epilepsy and epileptic syndromes, not intractable, without status epilepticus: Secondary | ICD-10-CM

## 2018-08-17 MED ORDER — DIVALPROEX SODIUM ER 500 MG PO TB24: 500.0000 mg | ORAL_TABLET | Freq: Every day | ORAL | 11 refills | Status: DC

## 2018-08-17 NOTE — Progress Notes (Signed)
Notified pt mother ordered lab-Instruct pt mother to come to the office first to register front office then will send the patient 2nd floor for the lab  endocrinology. Pt mother understood without questions.
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
## 2018-08-18 ENCOUNTER — Ambulatory Visit: Payer: Self-pay | Admitting: Neurology

## 2018-09-10 DIAGNOSIS — Z720 Tobacco use: Secondary | ICD-10-CM | POA: Diagnosis not present

## 2018-09-17 ENCOUNTER — Telehealth: Payer: Self-pay | Admitting: Neurology

## 2018-09-17 ENCOUNTER — Other Ambulatory Visit (INDEPENDENT_AMBULATORY_CARE_PROVIDER_SITE_OTHER): Payer: Medicare HMO

## 2018-09-17 ENCOUNTER — Other Ambulatory Visit: Payer: Self-pay

## 2018-09-17 DIAGNOSIS — G40309 Generalized idiopathic epilepsy and epileptic syndromes, not intractable, without status epilepticus: Secondary | ICD-10-CM

## 2018-09-17 NOTE — Telephone Encounter (Signed)
Spoke with pt mom she is bringing him in today to have his lab work done

## 2018-09-17 NOTE — Telephone Encounter (Signed)
Left msg with after hours about son's lab work. Please call her back at [phone]. Thanks!

## 2018-09-17 NOTE — Telephone Encounter (Signed)
Pt mother called back no answer voice mail left for her to return call

## 2018-09-18 LAB — COMPREHENSIVE METABOLIC PANEL
AG Ratio: 1.7 (calc) (ref 1.0–2.5)
ALT: 8 U/L — ABNORMAL LOW (ref 9–46)
AST: 16 U/L (ref 10–40)
Albumin: 4 g/dL (ref 3.6–5.1)
Alkaline phosphatase (APISO): 50 U/L (ref 36–130)
BUN: 11 mg/dL (ref 7–25)
CO2: 27 mmol/L (ref 20–32)
Calcium: 9.2 mg/dL (ref 8.6–10.3)
Chloride: 105 mmol/L (ref 98–110)
Creat: 1.3 mg/dL (ref 0.60–1.35)
Globulin: 2.4 g/dL (calc) (ref 1.9–3.7)
Glucose, Bld: 88 mg/dL (ref 65–99)
Potassium: 4 mmol/L (ref 3.5–5.3)
Sodium: 138 mmol/L (ref 135–146)
Total Bilirubin: 0.3 mg/dL (ref 0.2–1.2)
Total Protein: 6.4 g/dL (ref 6.1–8.1)

## 2018-09-18 LAB — VALPROIC ACID LEVEL: Valproic Acid Lvl: 27.1 mg/L — ABNORMAL LOW (ref 50.0–100.0)

## 2018-09-22 ENCOUNTER — Other Ambulatory Visit: Payer: Self-pay

## 2018-09-22 DIAGNOSIS — Z79899 Other long term (current) drug therapy: Secondary | ICD-10-CM

## 2018-09-22 DIAGNOSIS — G40309 Generalized idiopathic epilepsy and epileptic syndromes, not intractable, without status epilepticus: Secondary | ICD-10-CM

## 2018-09-22 MED ORDER — DIVALPROEX SODIUM ER 500 MG PO TB24: 1000.0000 mg | ORAL_TABLET | Freq: Every day | ORAL | 11 refills | Status: DC

## 2018-09-22 NOTE — Telephone Encounter (Signed)
Spoke with pt mother informed that that his liver function is now normal, we had talked about going back up on his Depakote dose if liver better, pls have him increase Depakote ER 500mg : Take 2 tablets every night. We will need to re-check his Depakote level on the higher dose, if he can have repeat Depakote level in a month, new depakote script sent to [facility], lab order placed for next month,

## 2018-10-01 ENCOUNTER — Telehealth: Payer: Self-pay | Admitting: Neurology

## 2018-10-01 ENCOUNTER — Telehealth: Payer: Self-pay | Admitting: Family Medicine

## 2018-10-01 ENCOUNTER — Other Ambulatory Visit: Payer: Self-pay

## 2018-10-01 ENCOUNTER — Encounter (HOSPITAL_COMMUNITY): Payer: Self-pay | Admitting: *Deleted

## 2018-10-01 ENCOUNTER — Ambulatory Visit (HOSPITAL_COMMUNITY)
Admission: RE | Admit: 2018-10-01 | Discharge: 2018-10-01 | Disposition: A | Discharge status: Home / Self Care | Payer: Medicare HMO | Source: Home / Self Care | Attending: Psychiatry | Admitting: Psychiatry

## 2018-10-01 ENCOUNTER — Emergency Department (HOSPITAL_COMMUNITY)
Admission: EM | Admit: 2018-10-01 | Discharge: 2018-10-02 | Disposition: A | Discharge status: Home / Self Care | Payer: Medicare HMO | Attending: Emergency Medicine | Admitting: Emergency Medicine

## 2018-10-01 DIAGNOSIS — F600 Paranoid personality disorder: Secondary | ICD-10-CM | POA: Diagnosis not present

## 2018-10-01 DIAGNOSIS — F319 Bipolar disorder, unspecified: Secondary | ICD-10-CM | POA: Diagnosis present

## 2018-10-01 DIAGNOSIS — F17210 Nicotine dependence, cigarettes, uncomplicated: Secondary | ICD-10-CM | POA: Diagnosis not present

## 2018-10-01 DIAGNOSIS — F1020 Alcohol dependence, uncomplicated: Secondary | ICD-10-CM | POA: Diagnosis not present

## 2018-10-01 DIAGNOSIS — F14151 Cocaine abuse with cocaine-induced psychotic disorder with hallucinations: Secondary | ICD-10-CM | POA: Diagnosis not present

## 2018-10-01 DIAGNOSIS — F12159 Cannabis abuse with psychotic disorder, unspecified: Secondary | ICD-10-CM | POA: Diagnosis not present

## 2018-10-01 DIAGNOSIS — G40009 Localization-related (focal) (partial) idiopathic epilepsy and epileptic syndromes with seizures of localized onset, not intractable, without status epilepticus: Secondary | ICD-10-CM | POA: Diagnosis not present

## 2018-10-01 DIAGNOSIS — F22 Delusional disorders: Secondary | ICD-10-CM | POA: Diagnosis present

## 2018-10-01 DIAGNOSIS — F203 Undifferentiated schizophrenia: Secondary | ICD-10-CM | POA: Diagnosis not present

## 2018-10-01 DIAGNOSIS — Z03818 Encounter for observation for suspected exposure to other biological agents ruled out: Secondary | ICD-10-CM | POA: Diagnosis not present

## 2018-10-01 DIAGNOSIS — F29 Unspecified psychosis not due to a substance or known physiological condition: Secondary | ICD-10-CM | POA: Diagnosis present

## 2018-10-01 DIAGNOSIS — Z20828 Contact with and (suspected) exposure to other viral communicable diseases: Secondary | ICD-10-CM | POA: Insufficient documentation

## 2018-10-01 DIAGNOSIS — R69 Illness, unspecified: Secondary | ICD-10-CM | POA: Diagnosis not present

## 2018-10-01 LAB — CBC
HCT: 41.8 % (ref 39.0–52.0)
Hemoglobin: 14.1 g/dL (ref 13.0–17.0)
MCH: 29.2 pg (ref 26.0–34.0)
MCHC: 33.7 g/dL (ref 30.0–36.0)
MCV: 86.5 fL (ref 80.0–100.0)
Platelets: 228 10*3/uL (ref 150–400)
RBC: 4.83 MIL/uL (ref 4.22–5.81)
RDW: 12.7 % (ref 11.5–15.5)
WBC: 8.3 10*3/uL (ref 4.0–10.5)
nRBC: 0 % (ref 0.0–0.2)

## 2018-10-01 LAB — ETHANOL: Alcohol, Ethyl (B): <10 mg/dL (ref <10)

## 2018-10-01 LAB — COMPREHENSIVE METABOLIC PANEL
ALT: 16 U/L (ref 0–44)
AST: 27 U/L (ref 15–41)
Albumin: 4.4 g/dL (ref 3.5–5.0)
Alkaline Phosphatase: 60 U/L (ref 38–126)
Anion gap: 12 (ref 5–15)
BUN: 9 mg/dL (ref 6–20)
CO2: 23 mmol/L (ref 22–32)
Calcium: 9.5 mg/dL (ref 8.9–10.3)
Chloride: 99 mmol/L (ref 98–111)
Creatinine, Ser: 1.29 mg/dL — ABNORMAL HIGH (ref 0.61–1.24)
GFR calc Af Amer: >60 mL/min (ref >60)
GFR calc non Af Amer: >60 mL/min (ref >60)
Glucose, Bld: 119 mg/dL — ABNORMAL HIGH (ref 70–99)
Potassium: 3.4 mmol/L — ABNORMAL LOW (ref 3.5–5.1)
Sodium: 134 mmol/L — ABNORMAL LOW (ref 135–145)
Total Bilirubin: 1.4 mg/dL — ABNORMAL HIGH (ref 0.3–1.2)
Total Protein: 7.8 g/dL (ref 6.5–8.1)

## 2018-10-01 LAB — RAPID URINE DRUG SCREEN, HOSP PERFORMED
Amphetamines: NOT DETECTED
Barbiturates: NOT DETECTED
Benzodiazepines: NOT DETECTED
Cocaine: POSITIVE — AB
Opiates: NOT DETECTED
Tetrahydrocannabinol: POSITIVE — AB

## 2018-10-01 NOTE — Telephone Encounter (Signed)
Copied from [place] [id number]. Topic: Quick Communication - See Telephone Encounter >> Oct 01, 2018 11:28 AM Loma [place] wrote: CRM for notification. See Telephone encounter for: 10/01/18. Pls  have Cory reach out as soon asap. Manuela Schwartz pt mom has pt at [facility] health/ afraid to even leave, since med change he is hearing voices in his head and she terrified to take home. Pls call her at [phone] as soon as you can. She feels you may know the med that is causing this and can make the adjustment, she is in such a tissy that she can not even think.

## 2018-10-01 NOTE — ED Provider Notes (Signed)
Emergency Department Provider Note   I have reviewed the triage vital signs and the nursing notes.   HISTORY  Chief Complaint Psychiatric Evaluation   HPI Alfred [name] is a 43 y.o. male who presents the emergency department voluntarily today for worsening paranoia and delusions.  I did discuss, with the patient's permission, the case with his mother as well and they both endorse these had decreased sleep recently and have been increase his medications but is persistently worsening paranoia thinking that people are out to get him and kill him.  Also thinks that people worse around his house and may be try to hurt his family to.  He states he sees snakes and spiders and other shadows.  Patient has a history of similar symptoms and they have been working to increase his medications but does not seem to be helping.  This is.  No other complaints at this time.   No other associated or modifying symptoms.    Past Medical History:  Diagnosis Date  . Alcohol abuse   . Depression   . Hypertension   . Migraines   . Schizophrenia (HCC)   . Seizure (HCC)    alcoho    Patient Active Problem List   Diagnosis Date Noted  . Localization-related idiopathic epilepsy and epileptic syndromes with seizures of localized onset, not intractable, without status epilepticus (HCC) 06/21/2017  . Paranoid schizophrenia (HCC) 06/19/2017  . Gastroesophageal reflux disease with esophagitis   . Cocaine abuse w/cocaine-induced psychotic disorder w/hallucinations (HCC) 12/07/2016  . Cannabis abuse with psychotic disorder (HCC) 12/07/2016  . Pericarditis 12/06/2016  . Rhabdomyolysis 12/06/2016  . EtOH dependence (HCC) 12/06/2016  . Transient alteration of awareness 08/28/2016  . Jerky body movements 08/28/2016  . Seizures (HCC) 06/18/2016  . Internal hemorrhoids 09/30/2013  . Acute psychosis (HCC) 05/29/2006  . TOBACCO DEPENDENCE 05/29/2006    Past Surgical History:  Procedure Laterality Date  .  WISDOM TOOTH EXTRACTION      Current Outpatient Rx  . Order #: [id number] Class: Normal  . Order #: [id number] Class: Normal  . Order #: [id number] Class: Print  . Order #: [id number] Class: Print  . Order #: [id number] Class: Normal  . Order #: [id number] Class: No Print  . Order #: [id number] Class: Print    Allergies Patient has no known allergies.  Family History  Problem Relation Age of Onset  . Diabetes Mother   . Hypertension Mother   . Hyperlipidemia Mother   . Anemia Mother   . Heart murmur Mother   . Colon polyps Maternal Uncle   . Heart disease Maternal Uncle   . Celiac disease Maternal Grandmother   . Diabetes Maternal Grandmother   . Diabetes Maternal Grandfather     Social History Social History   Tobacco Use  . Smoking status: Current Some Day Smoker    Packs/day: 1.00    Types: Cigarettes  . Smokeless tobacco: Never Used  Substance Use Topics  . Alcohol use: Yes  . Drug use: Yes    Types: Marijuana    Review of Systems  All other systems negative except as documented in the HPI. All pertinent positives and negatives as reviewed in the HPI. ____________________________________________   PHYSICAL EXAM:  VITAL SIGNS: ED Triage Vitals  Enc Vitals Group     BP 10/01/18 2211 (!) 132/96     Pulse Rate 10/01/18 2211 (!) 111     Resp 10/01/18 2211 16     Temp 10/01/18 2211 98.1 F (36.7 C)  Temp Source 10/01/18 2211 Oral     SpO2 10/01/18 2211 99 %    Constitutional: Alert and oriented. Well appearing and in no acute distress. Eyes: Conjunctivae are normal. PERRL. EOMI. Head: Atraumatic. Nose: No congestion/rhinnorhea. Mouth/Throat: Mucous membranes are moist.  Oropharynx non-erythematous. Neck: No stridor.  No meningeal signs.   Cardiovascular: Normal rate, regular rhythm. Good peripheral circulation. Grossly normal heart sounds.   Respiratory: Normal respiratory effort.  No retractions. Lungs CTAB. Gastrointestinal: Soft and nontender. No  distention.  Musculoskeletal: No lower extremity tenderness nor edema. No gross deformities of extremities. Neurologic:  Normal speech and language. No gross focal neurologic deficits are appreciated.  Skin:  Skin is warm, dry and intact. No rash noted. Psych: appears distrustful, mildly anxious but overall calm  ____________________________________________   LABS (all labs ordered are listed, but only abnormal results are displayed)  Labs Reviewed  COMPREHENSIVE METABOLIC PANEL - Abnormal; Notable for the following components:      Result Value   Sodium 134 (*)    Potassium 3.4 (*)    Glucose, Bld 119 (*)    Creatinine, Ser 1.29 (*)    Total Bilirubin 1.4 (*)    All other components within normal limits  RAPID URINE DRUG SCREEN, HOSP PERFORMED - Abnormal; Notable for the following components:   Cocaine POSITIVE (*)    Tetrahydrocannabinol POSITIVE (*)    All other components within normal limits  VALPROIC ACID LEVEL - Abnormal; Notable for the following components:   Valproic Acid Lvl 28 (*)    All other components within normal limits  SARS CORONAVIRUS 2 (HOSPITAL ORDER, [place] [facility])  ETHANOL  CBC   ____________________________________________  EKG   EKG Interpretation  Date/Time:    Ventricular Rate:    PR Interval:    QRS Duration:   QT Interval:    QTC Calculation:   R Axis:     Text Interpretation:         ____________________________________________   INITIAL IMPRESSION / ASSESSMENT AND PLAN / ED COURSE  Patient appears to be acutely decompensated psychosis.  Borderline whether he is danger to himself or others at this time but will consult TTS for recommendations.  Medically cleared for TTS consult.      Pertinent labs & imaging results that were available during my care of the patient were reviewed by me and considered in my medical decision making (see chart for details).  ____________________________________________   FINAL CLINICAL IMPRESSION(S) / ED DIAGNOSES  Final diagnoses:  None     MEDICATIONS GIVEN DURING THIS VISIT:  Medications  LORazepam (ATIVAN) tablet 2 mg (has no administration in time range)     NEW OUTPATIENT MEDICATIONS STARTED DURING THIS VISIT:  New Prescriptions   No medications on file    Note:  This note was prepared with assistance of Dragon voice recognition software. Occasional wrong-word or sound-a-like substitutions may have occurred due to the inherent limitations of voice recognition software.   Maribeth Jiles, Corene Cornea, MD 10/02/18 [phone]

## 2018-10-01 NOTE — ED Notes (Signed)
[place] pts mother wants an update soon as possible she is in the parking lot

## 2018-10-01 NOTE — ED Notes (Addendum)
After triage, pt says "well, if I am by myself I might hurt myself, I am not a fighter, I am a lover" "I think there are snakes in my house, you know, underneath"

## 2018-10-01 NOTE — Telephone Encounter (Signed)
Pt is only taking Depakote 1000mg  qhs.  Does he still need to decrease? Is his rash coming from the Depakote? Does he need to see his PCP?

## 2018-10-01 NOTE — ED Triage Notes (Signed)
Pt says that he is hearing voices that are telling him to walk back and forth and "having a whole conversation". "I feel threatened and I here to protect yall". Pt denies SI or HI. Reports anxiety and that "his meds are off".

## 2018-10-01 NOTE — H&P (Signed)
Behavioral Health Medical Screening Exam  Alfred [name] is an 43 y.o. male.who presents as a voluntary walk-in accompanied alone. Patient endorses he has a history of schizophrenia and his medications were adjusted three days ago and hjis Depakote was increased. He states yesterday, he started to notice a rash on his lips. He states he is concerned that the rash could be from the Depakote. He reports this is causing his anxiety to increase. He denies any other side effects. He denies feeling homicidal or suicidal. He does endorse AH however reports this is no new concern, the hallucinations are not increasing and this is not a new concern. He states, " I hear the voices all the time and they told me with the schizophrenia, I may always hear them." He denies that the hallucinations are command. He does not appear internally preoccupied. He reports going to [facility] for medication management. He denies any other concerns at this time.   Total Time spent with patient: 15 minutes  Psychiatric Specialty Exam: Physical Exam  Nursing note and vitals reviewed. Constitutional: He is oriented to person, place, and time.  Neurological: He is alert and oriented to person, place, and time.    Review of Systems  Psychiatric/Behavioral: Positive for hallucinations. Negative for depression, memory loss, substance abuse and suicidal ideas. The patient is nervous/anxious. The patient does not have insomnia.   All other systems reviewed and are negative.   Blood pressure (!) 150/96, pulse (!) 119, temperature 99.1 F (37.3 C), temperature source Oral, resp. rate 18, SpO2 99 %.There is no height or weight on file to calculate BMI.  General Appearance: Disheveled  Eye Contact:  Good  Speech:  Clear and Coherent and Normal Rate  Volume:  Normal  Mood:  Anxious  Affect:  Congruent  Thought Process:  Coherent, Goal Directed, Linear and Descriptions of Associations: Intact  Orientation:  Full (Time, Place, and  Person)  Thought Content:  Hallucinations: Auditory; ongoing. No new concerns. Hx of schizophrenia   Suicidal Thoughts:  No  Homicidal Thoughts:  No  Memory:  Immediate;   Fair Recent;   Fair  Judgement:  Fair  Insight:  Fair  Psychomotor Activity:  Normal  Concentration: Concentration: Fair and Attention Span: Fair  Recall:  [employer] of Knowledge:Fair  Language: Good  Akathisia:  Negative  Handed:  Right  AIMS (if indicated):     Assets:  Communication Skills Desire for Improvement Resilience  Sleep:       Musculoskeletal: Strength & Muscle Tone: within normal limits Gait & Station: normal Patient leans: N/A  Blood pressure (!) 150/96, pulse (!) 119, temperature 99.1 F (37.3 C), temperature source Oral, resp. rate 18, SpO2 99 %.  Recommendations:  No evidence of imminent risk to self or others at present.   Patient does not meet criteria for psychiatric inpatient admission. Reccommended to continue follow-up with his outpatient team and to call [facility] ASAP to discuss medication concerns.      Mordecai Maes, NP 10/01/2018, 11:51 AM

## 2018-10-01 NOTE — Telephone Encounter (Signed)
I think his mom has been calling several of his physicians, was it Behavioral Health that increased the Depakote to 1000mg  BID? Because on 6/23 we increased only to 1000mg  at bedtime (not twice a day). If he is on the 1000mg  qhs, then reduce back to 500mg  qhs. If he is taking it twice a day because instructed by his psychiatrist, then needs to speak to his psychiatrist. Thanks

## 2018-10-01 NOTE — Telephone Encounter (Signed)
He has been on this dose before, I don't think it's the Depakote. Pls speak to PCP about rash. But if he is having more hallucinations since increasing dose, go back to 500mg  qhs. Thanks!

## 2018-10-01 NOTE — Telephone Encounter (Signed)
Spoke to patients mom and advised that she will need to call Dr. Amparo [place] office.  Pt having a reaction to the Depakote change.  She says she already has a call to that office and is waiting on a response.  Nothing further needed.

## 2018-10-01 NOTE — Telephone Encounter (Signed)
New Message  Pt c/o medication issue:  1. Name of Medication: Divalproex, twice daily 1000 mg  2. How are you currently taking this medication (dosage and times per day)? See above  3. Are you having a reaction (difficulty breathing--STAT)? No  4. What is your medication issue? Pt feels as though they are having side effects from the med listed above such as severe rash around mouth, hyperactivity, and not able to sleep.  Please f/u with pt

## 2018-10-01 NOTE — BH Assessment (Signed)
Assessment Note  Alfred [name] is an 43 y.o. male. Pt denies SI/HI and AVH. Per Pt he is in need of medication management because his current medication has caused a facial rash. Per Pt has was inpatient "a couple of weeks ago" and he received medication but it was changed when he returned to [facility]. Per Pt he feels the medication change caused the rash. Pt is seen by Dr. Merlyn AlbertFred at [facility].  Garlan FillersLaShunda, NP recommends D/C and follow-up with Dr. Merlyn AlbertFred.  Diagnosis:  F20.9 Schzophrenia  Past Medical History:  Past Medical History:  Diagnosis Date  . Alcohol abuse   . Depression   . Hypertension   . Migraines   . Schizophrenia (HCC)   . Seizure (HCC)    alcoho    Past Surgical History:  Procedure Laterality Date  . WISDOM TOOTH EXTRACTION      Family History:  Family History  Problem Relation Age of Onset  . Diabetes Mother   . Hypertension Mother   . Hyperlipidemia Mother   . Anemia Mother   . Heart murmur Mother   . Colon polyps Maternal Uncle   . Heart disease Maternal Uncle   . Celiac disease Maternal Grandmother   . Diabetes Maternal Grandmother   . Diabetes Maternal Grandfather     Social History:  reports that he has been smoking cigarettes. He has been smoking about 1.00 pack per day. He has never used smokeless tobacco. He reports current alcohol use. He reports current drug use. Drug: Marijuana.  Additional Social History:  Alcohol / Drug Use Pain Medications: please see mar Prescriptions: please see mar Over the Counter: please see mar History of alcohol / drug use?: No history of alcohol / drug abuse Longest period of sobriety (when/how long): NA Negative Consequences of Use: Financial, Legal, Personal relationships, Work / School  CIWA: CIWA-Ar BP: (!) 150/96 Pulse Rate: (!) 119 COWS:    Allergies: No Known Allergies  Home Medications: (Not in a hospital admission)   OB/GYN Status:  No LMP for male patient.  General Assessment Data [facility] T[facility] Assessment: In system Is this a Tele or Face-to-Face Assessment?: Face-to-Face Is this an Initial Assessment or a Re-assessment for this encounter?: Initial Assessment Patient Accompanied by:: N/A Language Other than English: No Living Arrangements: Other (Comment) What gender do you identify as?: Male Marital status: Single Maiden name: NA Pregnancy Status: No Living Arrangements: Other relatives Can pt return to current living arrangement?: No Admission Status: Voluntary Is patient capable of signing voluntary admission?: Yes Referral Source: Self/Family/Friend Insurance type: SP  Medical Screening Exam [facility] Walk-in ONLY) Medical Exam completed: Yes  Crisis Care Plan Living Arrangements: Other relatives Legal Guardian: Other:(self) Name of Psychiatrist: NA Name of Therapist: NA  Education Status Is patient currently in school?: No Is the patient employed, unemployed or receiving disability?: Unemployed  Risk to self with the past 6 months Suicidal Ideation: No Has patient been a risk to self within the past 6 months prior to admission? : No Suicidal Intent: No Has patient had any suicidal intent within the past 6 months prior to admission? : No Is patient at risk for suicide?: No Suicidal Plan?: No Has patient had any suicidal plan within the past 6 months prior to admission? : No Access to Means: No What has been your use of drugs/alcohol within the last 12 months?: NA Previous Attempts/Gestures: No How many times?: 0 Other Self Harm Risks: NA Triggers for Past  Attempts: None known Intentional Self Injurious Behavior: None Family Suicide History: No Recent stressful life event(s): Other (Comment)(allergic reaction to meds) Persecutory voices/beliefs?: No Depression: No Depression Symptoms: (pt denies) Substance abuse history and/or treatment for substance abuse?: No Suicide prevention information given to non-admitted  patients: Not applicable  Risk to Others within the past 6 months Homicidal Ideation: No Does patient have any lifetime risk of violence toward others beyond the six months prior to admission? : No Thoughts of Harm to Others: No Current Homicidal Intent: No Current Homicidal Plan: No Access to Homicidal Means: No Identified Victim: na History of harm to others?: No Assessment of Violence: None Noted Violent Behavior Description: na Does patient have access to weapons?: No Criminal Charges Pending?: No Does patient have a court date: No Is patient on probation?: No  Psychosis Hallucinations: None noted Delusions: None noted  Mental Status Report Appearance/Hygiene: Unremarkable Eye Contact: Fair Motor Activity: Freedom of movement Speech: Logical/coherent Level of Consciousness: Alert Mood: Anxious Affect: Anxious Anxiety Level: Minimal Thought Processes: Coherent, Relevant Judgement: Unimpaired Orientation: Person, Place, Time, Situation Obsessive Compulsive Thoughts/Behaviors: None  Cognitive Functioning Concentration: Normal Memory: Recent Intact, Remote Intact Is patient IDD: No Insight: Fair Impulse Control: Fair Appetite: Fair Have you had any weight changes? : No Change Sleep: No Change Total Hours of Sleep: 7 Vegetative Symptoms: None  ADLScreening [facility] Assessment Services) Patient's cognitive ability adequate to safely complete daily activities?: Yes Patient able to express need for assistance with ADLs?: Yes Independently performs ADLs?: Yes (appropriate for developmental age)  Prior Inpatient Therapy Prior Inpatient Therapy: Yes Prior Therapy Dates: 2020 [facility] Reason for Treatment: Schzophrenia  Prior Outpatient Therapy Prior Outpatient Therapy: Yes Prior Therapy Dates: current Prior Therapy Facilty/Provider(s): [facility] Reason for Treatment: Schizophrenia Does patient have an ACCT team?: No Does patient have  Intensive In-House Services?  : No Does patient have [facility] services? : No Does patient have P4CC services?: No  ADL Screening (condition at time of admission) Patient's cognitive ability adequate to safely complete daily activities?: Yes Is the patient deaf or have difficulty hearing?: No Does the patient have difficulty seeing, even when wearing glasses/contacts?: No Does the patient have difficulty concentrating, remembering, or making decisions?: No Patient able to express need for assistance with ADLs?: Yes Does the patient have difficulty dressing or bathing?: No Independently performs ADLs?: Yes (appropriate for developmental age)       Abuse/Neglect Assessment (Assessment to be complete while patient is alone) Abuse/Neglect Assessment Can Be Completed: Yes Physical Abuse: Denies, provider concerned (Comment) Verbal Abuse: Denies, provider concerned (Comment) Sexual Abuse: Denies, provider concered (Comment) Exploitation of patient/patient's resources: Denies, provider concerned (Comment)     Advance Directives (For Healthcare) Does Patient Have a Medical Advance Directive?: No Would patient like information on creating a medical advance directive?: No - Patient declined          Disposition:  Disposition Initial Assessment Completed for this Encounter: Yes Disposition of Patient: Discharge  On Site Evaluation by:   Reviewed with Physician:    Cyndia Bent 10/01/2018 1:19 PM

## 2018-10-01 NOTE — Telephone Encounter (Signed)
Rash inside of mouth since he increased Depakote. Unsure if that is the med  Does not feel like himself  Pt is currently at [name] ( since this am)  Pt was having hallucinations ( someone was shooting and coming after him)

## 2018-10-02 ENCOUNTER — Telehealth: Payer: Self-pay | Admitting: Adult Health

## 2018-10-02 DIAGNOSIS — F203 Undifferentiated schizophrenia: Secondary | ICD-10-CM | POA: Diagnosis not present

## 2018-10-02 DIAGNOSIS — R69 Illness, unspecified: Secondary | ICD-10-CM | POA: Diagnosis not present

## 2018-10-02 DIAGNOSIS — F22 Delusional disorders: Secondary | ICD-10-CM | POA: Diagnosis present

## 2018-10-02 DIAGNOSIS — F319 Bipolar disorder, unspecified: Secondary | ICD-10-CM | POA: Diagnosis not present

## 2018-10-02 DIAGNOSIS — F600 Paranoid personality disorder: Secondary | ICD-10-CM

## 2018-10-02 LAB — VALPROIC ACID LEVEL: Valproic Acid Lvl: 28 ug/mL — ABNORMAL LOW (ref 50.0–100.0)

## 2018-10-02 LAB — SARS CORONAVIRUS 2 BY RT PCR (HOSPITAL ORDER, PERFORMED IN ~~LOC~~ HOSPITAL LAB): SARS Coronavirus 2: NEGATIVE

## 2018-10-02 MED ORDER — LORAZEPAM 1 MG PO TABS: 2.0000 mg | ORAL_TABLET | Freq: Once | ORAL | Status: DC | PRN

## 2018-10-02 NOTE — ED Notes (Signed)
Pt very paranoid, states that he thinks that someone is going to hurt him and he does not feel safe, denies SI/HI, pt worried about being in a room with locked doors and wonder why he is being categorized as a psych patient and having to wear purple scrubs like "the last time"
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
## 2018-10-02 NOTE — ED Notes (Signed)
Lunch tray ordered 

## 2018-10-02 NOTE — ED Notes (Signed)
Pt declined signiture

## 2018-10-02 NOTE — ED Notes (Signed)
TTS machine in use again at this time.

## 2018-10-02 NOTE — ED Notes (Signed)
Belongings placed in locker 11, pt given [place] sandwich meal

## 2018-10-02 NOTE — ED Notes (Signed)
Pt belongings removed, pt does not want to give up cell phone. Pt standing in hallway whispering to nurse. He thinks he "is going to a cell" and that is stuff "will be taken away and not given back". Reinforced to patient that he is safe here and his belongings will be safe. Security stand by and Dr. Dayna Barker speaking with pt. Once belongings secured, pt will transfer to room 36. Pt in scrubs and has been wanded by security team. 1 belongings bag labeled with pt stickers

## 2018-10-02 NOTE — ED Notes (Signed)
Breakfast ordered 

## 2018-10-02 NOTE — ED Notes (Signed)
Pt very paranoid about covid swab, pt initially refused stating that "Daisy Floro was putting chips in peoples brains when they get swabbed" Pt needed to inspect swab several times looking for chip before he would allow this RN to swab him.

## 2018-10-02 NOTE — Telephone Encounter (Signed)
Patient mother is calling to schedule a hospital follow up with [facility]. Please advise [phone]

## 2018-10-02 NOTE — ED Notes (Signed)
TTS set up & ready at this time at bedside.

## 2018-10-02 NOTE — Consult Note (Signed)
Telepsych Consultation   Reason for Consult:  Paranoia, bipolar disorder Referring Physician:  EDP Location of Patient:  Location of Provider: Behavioral Health TTS Department  Patient Identification: Alfred [name] MRN:  [id number] Principal Diagnosis: Bipolar disorder (HCC) Diagnosis:  Principal Problem:   Bipolar disorder (HCC) Active Problems:   Paranoia (HCC)   Total Time spent with patient: 30 minutes  Subjective:   Alfred [name] is a 43 y.o. male patient reports today that he is feeling better.  He states that he came to the ED because he had some type of rash around his mouth and it felt like his throat was swelling.  He stated that he was informed to increase his dose of Depakote and then he started having the rash.  He states that he goes to [facility] to get his medications prescribed to him.  He states that his last appointment was approximately 30 to 45 days ago and that his next appointment will be in approximately 45 days due to a 90-day appointment from his last appointment.  He denies any suicidal or homicidal ideations and denies any hallucinations.  Patient does report some paranoia, but he states that this is ongoing for him.  He states and agrees that he will follow-up with [facility] for his medication management and would like to return to the medications that he was taken to his previous ED visit before his medication was increased. Patient also stated that his throat and the rash have both improved since presenting to the ED.  HPI: Patient is a 43 year old male with a history of hallucinations and paranoia who presented to the ED stating that he had hallucinations of people threatening him.  He denied any suicidal or homicidal ideations.  He felt that his medications need to be adjusted.  Patient is seen by me via tele-psych and have consulted with Dr. Lucianne Muss.  Patient denies any suicidal or homicidal ideations and denies any hallucinations.  Patient does admit to having  some paranoia but reports that this is chronic for him.  Patient states that he agrees to follow-up with [facility] to get his medications adjusted.  At this time the patient does not meet inpatient criteria and is psychiatrically cleared.  I attempted to contact Dr. Ranae Palms and Arthor Captain, PA-C but neither were available at the time and I contacted RN Idalia Needle and notified her of the recommendations and she stated that she would contact the provider.  Past Psychiatric History: Schizophrenia, bipolar disorder, multiple ED visits, previous hospitalizations, followed by [facility] currently  Risk to Self: Suicidal Ideation: No Suicidal Intent: No Is patient at risk for suicide?: No Suicidal Plan?: No Access to Means: No What has been your use of drugs/alcohol within the last 12 months?: NA How many times?: 0 Other Self Harm Risks: NA Triggers for Past Attempts: None known Intentional Self Injurious Behavior: None Risk to Others: Homicidal Ideation: No Thoughts of Harm to Others: No Current Homicidal Intent: No Current Homicidal Plan: No Access to Homicidal Means: No Identified Victim: NA History of harm to others?: No Assessment of Violence: None Noted Violent Behavior Description: na Does patient have access to weapons?: No Criminal Charges Pending?: No Does patient have a court date: No Prior Inpatient Therapy: Prior Inpatient Therapy: Yes Prior Therapy Dates: 2020 Prior Therapy Facilty/Provider(s): [facility] Reason for Treatment: Schzophrenia Prior Outpatient Therapy: Prior Outpatient Therapy: Yes Prior Therapy Dates: current Prior Therapy Facilty/Provider(s): [facility] Reason for Treatment: Schizophrenia Does patient have an ACCT team?: No Does patient have  Intensive In-House Services?  : No Does patient have [facility] services? : No Does patient have P4CC services?: No  Past Medical History:  Past Medical History:  Diagnosis Date  . Alcohol abuse   . Depression   . Hypertension   .  Migraines   . Schizophrenia (HCC)   . Seizure (HCC)    alcoho    Past Surgical History:  Procedure Laterality Date  . WISDOM TOOTH EXTRACTION     Family History:  Family History  Problem Relation Age of Onset  . Diabetes Mother   . Hypertension Mother   . Hyperlipidemia Mother   . Anemia Mother   . Heart murmur Mother   . Colon polyps Maternal Uncle   . Heart disease Maternal Uncle   . Celiac disease Maternal Grandmother   . Diabetes Maternal Grandmother   . Diabetes Maternal Grandfather    Family Psychiatric  History: Denies Social History:  Social History   Substance and Sexual Activity  Alcohol Use Yes     Social History   Substance and Sexual Activity  Drug Use Yes  . Types: Marijuana    Social History   Socioeconomic History  . Marital status: Single    Spouse name: Not on file  . Number of children: 3  . Years of education: Not on file  . Highest education level: Not on file  Occupational History  . Occupation: Unemployed  Social Needs  . Financial resource strain: Not on file  . Food insecurity    Worry: Not on file    Inability: Not on file  . Transportation needs    Medical: Not on file    Non-medical: Not on file  Tobacco Use  . Smoking status: Current Some Day Smoker    Packs/day: 1.00    Types: Cigarettes  . Smokeless tobacco: Never Used  Substance and Sexual Activity  . Alcohol use: Yes  . Drug use: Yes    Types: Marijuana  . Sexual activity: Not on file  Lifestyle  . Physical activity    Days per week: Not on file    Minutes per session: Not on file  . Stress: Not on file  Relationships  . Social Musicianconnections    Talks on phone: Not on file    Gets together: Not on file    Attends religious service: Not on file    Active member of club or organization: Not on file    Attends meetings of clubs or organizations: Not on file    Relationship status: Not on file  Other Topics Concern  . Not on file  Social History Narrative  . Not  on file   Additional Social History:    Allergies:  No Known Allergies  Labs:  Results for orders placed or performed during the hospital encounter of 10/01/18 (from the past 48 hour(s))  Rapid urine drug screen (hospital performed)     Status: Abnormal   Collection Time: 10/01/18 10:16 PM  Result Value Ref Range   Opiates NONE DETECTED NONE DETECTED   Cocaine POSITIVE (A) NONE DETECTED   Benzodiazepines NONE DETECTED NONE DETECTED   Amphetamines NONE DETECTED NONE DETECTED   Tetrahydrocannabinol POSITIVE (A) NONE DETECTED   Barbiturates NONE DETECTED NONE DETECTED    Comment: (NOTE) DRUG SCREEN FOR MEDICAL PURPOSES ONLY.  IF CONFIRMATION IS NEEDED FOR ANY PURPOSE, NOTIFY LAB WITHIN 5 DAYS. LOWEST DETECTABLE LIMITS FOR URINE DRUG SCREEN Drug Class  Cutoff (ng/mL) Amphetamine and metabolites    1000 Barbiturate and metabolites    200 Benzodiazepine                 200 Tricyclics and metabolites     300 Opiates and metabolites        300 Cocaine and metabolites        300 THC                            50 Performed at [facility], [address] [place], [place] [address]   Comprehensive metabolic panel     Status: Abnormal   Collection Time: 10/01/18 10:18 PM  Result Value Ref Range   Sodium 134 (L) 135 - 145 mmol/L   Potassium 3.4 (L) 3.5 - 5.1 mmol/L   Chloride 99 98 - 111 mmol/L   CO2 23 22 - 32 mmol/L   Glucose, Bld 119 (H) 70 - 99 mg/dL   BUN 9 6 - 20 mg/dL   Creatinine, Ser 0.981.29 (H) 0.61 - 1.24 mg/dL   Calcium 9.5 8.9 - 11.910.3 mg/dL   Total Protein 7.8 6.5 - 8.1 g/dL   Albumin 4.4 3.5 - 5.0 g/dL   AST 27 15 - 41 U/L   ALT 16 0 - 44 U/L   Alkaline Phosphatase 60 38 - 126 U/L   Total Bilirubin 1.4 (H) 0.3 - 1.2 mg/dL   GFR calc non Af Amer >60 >60 mL/min   GFR calc Af Amer >60 >60 mL/min   Anion gap 12 5 - 15    Comment: Performed at [facility], [address] [place], [place] [address]  Ethanol     Status: None    Collection Time: 10/01/18 10:18 PM  Result Value Ref Range   Alcohol, Ethyl (B) <10 <10 mg/dL    Comment: (NOTE) Lowest detectable limit for serum alcohol is 10 mg/dL. For medical purposes only. Performed at [facility], [address] [place], [place] [address]   cbc     Status: None   Collection Time: 10/01/18 10:18 PM  Result Value Ref Range   WBC 8.3 4.0 - 10.5 K/uL   RBC 4.83 4.22 - 5.81 MIL/uL   Hemoglobin 14.1 13.0 - 17.0 g/dL   HCT 30.841.8 65.739.0 - 84.652.0 %   MCV 86.5 80.0 - 100.0 fL   MCH 29.2 26.0 - 34.0 pg   MCHC 33.7 30.0 - 36.0 g/dL   RDW 96.212.7 95.211.5 - 84.115.5 %   Platelets 228 150 - 400 K/uL   nRBC 0.0 0.0 - 0.2 %    Comment: Performed at [facility], [address] [place], [place] [address]  Valproic acid level     Status: Abnormal   Collection Time: 10/01/18 10:18 PM  Result Value Ref Range   Valproic Acid Lvl 28 (L) 50.0 - 100.0 ug/mL    Comment: Performed at [facility], [address] [place], [place] [address]  SARS Coronavirus 2 (CEPHEID - Performed in [facility] hospital lab), Hosp Order     Status: None   Collection Time: 10/02/18 12:59 AM   Specimen: Nasopharyngeal Swab  Result Value Ref Range   SARS Coronavirus 2 NEGATIVE NEGATIVE    Comment: (NOTE) If result is NEGATIVE SARS-CoV-2 target nucleic acids are NOT DETECTED. The SARS-CoV-2 RNA is generally detectable in upper and lower  respiratory specimens during the acute phase  of infection. The lowest  concentration of SARS-CoV-2 viral copies this assay can detect is 250  copies / mL. A negative result does not preclude SARS-CoV-2 infection  and should not be used as the sole basis for treatment or other  patient management decisions.  A negative result may occur with  improper specimen collection / handling, submission of specimen other  than nasopharyngeal swab, presence of viral mutation(s) within the  areas targeted by this assay, and inadequate number of viral copies  (<250  copies / mL). A negative result must be combined with clinical  observations, patient history, and epidemiological information. If result is POSITIVE SARS-CoV-2 target nucleic acids are DETECTED. The SARS-CoV-2 RNA is generally detectable in upper and lower  respiratory specimens dur ing the acute phase of infection.  Positive  results are indicative of active infection with SARS-CoV-2.  Clinical  correlation with patient history and other diagnostic information is  necessary to determine patient infection status.  Positive results do  not rule out bacterial infection or co-infection with other viruses. If result is PRESUMPTIVE POSTIVE SARS-CoV-2 nucleic acids MAY BE PRESENT.   A presumptive positive result was obtained on the submitted specimen  and confirmed on repeat testing.  While 2019 novel coronavirus  (SARS-CoV-2) nucleic acids may be present in the submitted sample  additional confirmatory testing may be necessary for epidemiological  and / or clinical management purposes  to differentiate between  SARS-CoV-2 and other Sarbecovirus currently known to infect humans.  If clinically indicated additional testing with an alternate test  methodology [phone]([id number]) is advised. The SARS-CoV-2 RNA is generally  detectable in upper and lower respiratory sp ecimens during the acute  phase of infection. The expected result is Negative. Fact Sheet for Patients:  BoilerBrush.com.cyhttps://www.fda.gov/media/136312/download Fact Sheet for Healthcare Providers: https://pope.com/https://www.fda.gov/media/136313/download This test is not yet approved or cleared by the [place] FDA and has been authorized for detection and/or diagnosis of SARS-CoV-2 by FDA under an Emergency Use Authorization (EUA).  This EUA will remain in effect (meaning this test can be used) for the duration of the COVID-19 declaration under Section 564(b)(1) of the Act, 21 U.S.C. section 360bbb-3(b)(1), unless the authorization is terminated or revoked  sooner. Performed at [facility], [address] [place], [place] [address]     Medications:  Current Facility-Administered Medications  Medication Dose Route Frequency Provider Last Rate Last Dose  . LORazepam (ATIVAN) tablet 2 mg  2 mg Oral Once PRN Mesner, Barbara CowerJason, MD       Current Outpatient Medications  Medication Sig Dispense Refill  . divalproex (DEPAKOTE ER) 500 MG 24 hr tablet Take 2 tablets (1,000 mg total) by mouth at bedtime. For mood stabilization 60 tablet 11  . famotidine (PEPCID) 20 MG tablet TAKE ONE TABLET BY MOUTH DAILY (Patient taking differently: Take 20 mg by mouth daily. ) 90 tablet 1  . hydrOXYzine (ATARAX/VISTARIL) 25 MG tablet Take 1 tablet (25 mg total) by mouth 3 (three) times daily as needed for anxiety. 270 tablet 3  . OLANZapine zydis (ZYPREXA) 10 MG disintegrating tablet Take 1 tablet (10 mg total) by mouth 2 (two) times daily. For mood control 60 tablet 0  . omeprazole (PRILOSEC) 40 MG capsule TAKE ONE CAPSULE BY MOUTH DAILY FOR ACID REFLUX (Patient taking differently: Take 40 mg by mouth daily. TAKE ONE CAPSULE BY MOUTH DAILY FOR ACID REFLUX) 90 capsule 1  . polyethylene glycol (MIRALAX / GLYCOLAX) packet Take 17 g by mouth daily as needed for  moderate constipation. Stop use if diarrhea starts. 72 packet 4  . traZODone (DESYREL) 50 MG tablet Take 1 tablet (50 mg total) by mouth at bedtime as needed for sleep. 30 tablet 0    Musculoskeletal: Strength & Muscle Tone: within normal limits Gait & Station: normal Patient leans: N/A  Psychiatric Specialty Exam: Physical Exam  Nursing note and vitals reviewed. Constitutional: He is oriented to person, place, and time. He appears well-developed and well-nourished.  Respiratory: Effort normal.  Musculoskeletal: Normal range of motion.  Neurological: He is alert and oriented to person, place, and time.  Skin: Skin is warm.    Review of Systems  Constitutional: Negative.   HENT: Negative.   Eyes:  Negative.   Respiratory: Negative.   Cardiovascular: Negative.   Gastrointestinal: Negative.   Genitourinary: Negative.   Musculoskeletal: Negative.   Skin: Negative.   Neurological: Negative.   Endo/Heme/Allergies: Negative.   Psychiatric/Behavioral:       Paranoia    Blood pressure (!) 154/90, pulse (!) 101, temperature 98.2 F (36.8 C), temperature source Oral, resp. rate 18, SpO2 98 %.There is no height or weight on file to calculate BMI.  General Appearance: Casual  Eye Contact:  Good  Speech:  Clear and Coherent and Normal Rate  Volume:  Normal  Mood:  Depressed  Affect:  Flat  Thought Process:  Coherent and Descriptions of Associations: Intact  Orientation:  Full (Time, Place, and Person)  Thought Content:  WDL and Paranoid Ideation  Suicidal Thoughts:  No  Homicidal Thoughts:  No  Memory:  Immediate;   Good Recent;   Good Remote;   Good  Judgement:  Fair  Insight:  Good  Psychomotor Activity:  Normal  Concentration:  Concentration: Good and Attention Span: Good  Recall:  Good  Fund of Knowledge:  Fair  Language:  Good  Akathisia:  No  Handed:  Right  AIMS (if indicated):     Assets:  Communication Skills Desire for Improvement Financial Resources/Insurance Housing Resilience Social Support Transportation  ADL's:  Intact  Cognition:  WNL  Sleep:        Treatment Plan Summary: Follow up with outpatient provider Beverly Sessions)  Continue medications as prescribed by [employer]  Disposition: No evidence of imminent risk to self or others at present.   Patient does not meet criteria for psychiatric inpatient admission. Supportive therapy provided about ongoing stressors. Discussed crisis plan, support from social network, calling 911, coming to the Emergency Department, and calling Suicide Hotline.  This service was provided via telemedicine using a 2-way, interactive audio and video technology.  Names of all persons participating in this telemedicine service  and their role in this encounter. Name: Alfred [name] Role: Patient  Name: Marvia Pickles NP  Role: provider  Name:  Role:   Name:  Role:     Lewis Shock, FNP 10/02/2018 10:45 AM

## 2018-10-05 NOTE — Telephone Encounter (Signed)
Pt states that rash has improved and he will see PCP if it becomes worse. He understood to only take 500mg  of Depakote nightly, States that 1000mg  qhs "messes with his mind"

## 2018-10-05 NOTE — Telephone Encounter (Signed)
Copied from [place] [id number]. Topic: Appointment Scheduling - Scheduling Inquiry for Clinic >> Oct 05, 2018  9:35 AM Parke Poisson wrote: Reason for CRM: Pt Mother Manuela Schwartz called stating that pt needs appointment for hospital f/u. I was told by office to sent CRM.Her contact number is [phone]   Mail box full

## 2018-10-06 ENCOUNTER — Telehealth: Payer: Self-pay | Admitting: Family Medicine

## 2018-10-06 NOTE — Telephone Encounter (Signed)
Pt is set up for virtual visit with [facility] on 10/07/2018 @ 9:30.  Nothing further needed.

## 2018-10-06 NOTE — Telephone Encounter (Signed)
I can see him but ultimately needs to see psych

## 2018-10-06 NOTE — Telephone Encounter (Signed)
Copied from [place] [id number]. Topic: Appointment Scheduling - Scheduling Inquiry for Clinic >> Oct 05, 2018  7:20 AM Scherrie Gerlach wrote: Reason for CRM: Mom calling to make a hospital follow up appt for the pt.  She states this appt is urgent, he needs to see [facility] asap/  they kept pt overnight on Sat.

## 2018-10-06 NOTE — Telephone Encounter (Signed)
Alfred Rumps, do you need to see the pt or should he follow up with Dr. Delice Lesch and psych?

## 2018-10-07 ENCOUNTER — Other Ambulatory Visit: Payer: Self-pay

## 2018-10-07 ENCOUNTER — Encounter: Payer: Self-pay | Admitting: Adult Health

## 2018-10-07 ENCOUNTER — Ambulatory Visit (INDEPENDENT_AMBULATORY_CARE_PROVIDER_SITE_OTHER): Payer: Medicare HMO | Admitting: Adult Health

## 2018-10-07 DIAGNOSIS — F200 Paranoid schizophrenia: Secondary | ICD-10-CM | POA: Diagnosis not present

## 2018-10-07 DIAGNOSIS — R69 Illness, unspecified: Secondary | ICD-10-CM | POA: Diagnosis not present

## 2018-10-07 NOTE — Progress Notes (Signed)
Virtual Visit via Telephone Note  I connected with Alfred [name] on 10/07/18 at  9:30 AM EDT by telephone and verified that I am speaking with the correct person using two identifiers.   I discussed the limitations, risks, security and privacy concerns of performing an evaluation and management service by telephone and the availability of in person appointments. I also discussed with the patient that there may be a patient responsible charge related to this service. The patient expressed understanding and agreed to proceed.  Location patient: home Location provider: work or home office Participants present for the call: patient, provider, mother ( Alfred [name]) Patient did not have a visit in the prior 7 days to address this/these issue(s).   History of Present Illness: Seen in the emergency room on 10/01/2018 voluntarily for worsening paranoia and delusions.  At this time he thought people were out to get him and kill him and his family.  Also stated that he saw snakes and spiders and other shadows.  Is medically cleared for TTS consult and ultimately discharged with a prescription for Ativan.  Back in May 2020 he was seen by neurology for concern of seizures.  At this time he and his girlfriend reported body jerks and twitches where his head jerks back and is no control over it.  At this time Depakote was increased to 1000 mg QHS. Hehad been on this dose in the past.  Appears then behavioral health increase the Depakote to 1000 mg twice daily.  She was having hallucinations Depakote was decreased to 500 mg nightly.  He started that this dose approximately 4 days ago.  Today he reports that he continues to hear voices that tell him to "go outside and stand in the middle of the street."  He also hears voices that him "they are going to hurt me in my family".  He is no longer having visual hallucinations and over the last  two days he has slept more soundly.   His mother denies feeling as though her  safety is in jeopardy.  [name] denies HI/SI    Observations/Objective: Patient sounds cheerful and well on the phone. I do not appreciate any SOB. Speech and thought processing are grossly intact. Patient reported vitals:  Assessment and Plan: 1. Paranoid schizophrenia [facility]) -Directed his mother to call [facility] and get an appointment as soon as possible.  Continue with Depakote 500 mg nightly.  Reassurance given to the patient that the voices he was hearing were not real and that nobody was going to hurt him.  If needed he needs to go to ER or BH.   Follow Up Instructions:  I did not refer this patient for an OV in the next 24 hours for this/these issue(s).  I discussed the assessment and treatment plan with the patient. The patient was provided an opportunity to ask questions and all were answered. The patient agreed with the plan and demonstrated an understanding of the instructions.   The patient was advised to call back or seek an in-person evaluation if the symptoms worsen or if the condition fails to improve as anticipated.  I provided 37minutes of non-face-to-face time during this encounter.   Dorothyann Peng, NP

## 2018-10-08 ENCOUNTER — Telehealth: Payer: Self-pay

## 2018-10-08 ENCOUNTER — Telehealth: Payer: Self-pay | Admitting: Neurology

## 2018-10-08 DIAGNOSIS — F250 Schizoaffective disorder, bipolar type: Secondary | ICD-10-CM | POA: Diagnosis not present

## 2018-10-08 DIAGNOSIS — Z720 Tobacco use: Secondary | ICD-10-CM | POA: Diagnosis not present

## 2018-10-08 NOTE — Telephone Encounter (Signed)
Spoke with mom and Alfred [name].  Both agreed to a virtual visit to discuss medications. He is currently taking Depakote 500mg  qhs   The son states that Dr. Josph Macho as increased one medication form 10-20mg . Not sure of name. May me Lorazepam??  Please schedule next available virtual visit

## 2018-10-08 NOTE — Telephone Encounter (Signed)
New Message  Alfred [name], after hours nurse called in liaison of the patient while we were on lunch.  [name] verbalized patient was recently hospitalized in regards symptoms or side effects of his medication Depakote.  Alfred [name] verbalized ED MD recommended decreasing dosage of Depakote.  Please f/u with pt

## 2018-10-08 NOTE — Telephone Encounter (Signed)
This has already been addressed. Please see prior telephone encounters.

## 2018-10-09 ENCOUNTER — Emergency Department (HOSPITAL_COMMUNITY)
Admission: EM | Admit: 2018-10-09 | Discharge: 2018-10-09 | Disposition: A | Discharge status: Home / Self Care | Payer: Medicare HMO | Attending: Emergency Medicine | Admitting: Emergency Medicine

## 2018-10-09 ENCOUNTER — Other Ambulatory Visit: Payer: Self-pay

## 2018-10-09 DIAGNOSIS — F17210 Nicotine dependence, cigarettes, uncomplicated: Secondary | ICD-10-CM | POA: Diagnosis not present

## 2018-10-09 DIAGNOSIS — I10 Essential (primary) hypertension: Secondary | ICD-10-CM | POA: Insufficient documentation

## 2018-10-09 DIAGNOSIS — K0889 Other specified disorders of teeth and supporting structures: Secondary | ICD-10-CM

## 2018-10-09 DIAGNOSIS — R69 Illness, unspecified: Secondary | ICD-10-CM | POA: Diagnosis not present

## 2018-10-09 DIAGNOSIS — Z79899 Other long term (current) drug therapy: Secondary | ICD-10-CM | POA: Diagnosis not present

## 2018-10-09 MED ORDER — ACETAMINOPHEN 325 MG PO TABS
650.0000 mg | ORAL_TABLET | Freq: Once | ORAL | Status: AC
  Administered 2018-10-09: 07:00:00 650 mg via ORAL
  Filled 2018-10-09: qty 2

## 2018-10-09 MED ORDER — PENICILLIN V POTASSIUM 500 MG PO TABS: 500.0000 mg | ORAL_TABLET | Freq: Four times a day (QID) | ORAL | 0 refills | Status: AC

## 2018-10-09 NOTE — ED Notes (Signed)
Pt.'s mother needs to be contacted prior to discharge to arrange pick up

## 2018-10-09 NOTE — Telephone Encounter (Signed)
I got him sch for a virtual visit on 10-20-18

## 2018-10-09 NOTE — ED Provider Notes (Signed)
[facility] EM[facility]ENCY [facility] Provider Note   CSN: [id number] Arrival date & time: 10/09/18  0124     History   Chief Complaint Chief Complaint  Patient presents with  . Dental Pain    HPI Alfred [name] is a 43 y.o. male.     Patient with a history of paranoid schizophrenia, HTN, alcohol abuse presents with dental pain in the upper teeth and report of a 'bump' that developed on the roof of his mouth. All symptoms started last night around 7:00 pm. No fever, nausea, difficulty swallowing.   The history is provided by the patient. No language interpreter was used.  Dental Pain Associated symptoms: oral lesions   Associated symptoms: no fever     Past Medical History:  Diagnosis Date  . Alcohol abuse   . Depression   . Hypertension   . Migraines   . Schizophrenia (HCC)   . Seizure (HCC)    alcoho    Patient Active Problem List   Diagnosis Date Noted  . Bipolar disorder (HCC) 10/02/2018  . Paranoia (HCC) 10/02/2018  . Localization-related idiopathic epilepsy and epileptic syndromes with seizures of localized onset, not intractable, without status epilepticus (HCC) 06/21/2017  . Paranoid schizophrenia (HCC) 06/19/2017  . Gastroesophageal reflux disease with esophagitis   . Cocaine abuse w/cocaine-induced psychotic disorder w/hallucinations (HCC) 12/07/2016  . Cannabis abuse with psychotic disorder (HCC) 12/07/2016  . Pericarditis 12/06/2016  . Rhabdomyolysis 12/06/2016  . EtOH dependence (HCC) 12/06/2016  . Transient alteration of awareness 08/28/2016  . Jerky body movements 08/28/2016  . Seizures (HCC) 06/18/2016  . Internal hemorrhoids 09/30/2013  . Acute psychosis (HCC) 05/29/2006  . TOBACCO DEPENDENCE 05/29/2006    Past Surgical History:  Procedure Laterality Date  . WISDOM TOOTH EXTRACTION          Home Medications    Prior to Admission medications   Medication Sig Start Date End Date Taking? Authorizing Provider   divalproex (DEPAKOTE ER) 500 MG 24 hr tablet Take 2 tablets (1,000 mg total) by mouth at bedtime. For mood stabilization 09/22/18   Van ClinesAquino, Karen M, MD  famotidine (PEPCID) 20 MG tablet TAKE ONE TABLET BY MOUTH DAILY Patient taking differently: Take 20 mg by mouth daily.  05/26/18   Nafziger, Kandee Keenory, NP  hydrOXYzine (ATARAX/VISTARIL) 25 MG tablet Take 1 tablet (25 mg total) by mouth 3 (three) times daily as needed for anxiety. 01/09/18   Nafziger, Kandee Keenory, NP  OLANZapine zydis (ZYPREXA) 10 MG disintegrating tablet Take 1 tablet (10 mg total) by mouth 2 (two) times daily. For mood control 12/19/17   Armandina StammerNwoko, Agnes I, NP  omeprazole (PRILOSEC) 40 MG capsule TAKE ONE CAPSULE BY MOUTH DAILY FOR ACID REFLUX Patient taking differently: Take 40 mg by mouth daily. TAKE ONE CAPSULE BY MOUTH DAILY FOR ACID REFLUX 05/26/18   Nafziger, Kandee Keenory, NP  penicillin v potassium (VEETID) 500 MG tablet Take 1 tablet (500 mg total) by mouth 4 (four) times daily for 7 days. 10/09/18 10/16/18  Elpidio AnisUpstill, Sophia Sperry, PA-C  polyethylene glycol (MIRALAX / GLYCOLAX) packet Take 17 g by mouth daily as needed for moderate constipation. Stop use if diarrhea starts. 01/09/18   Nafziger, Kandee Keenory, NP  traZODone (DESYREL) 50 MG tablet Take 1 tablet (50 mg total) by mouth at bedtime as needed for sleep. 12/19/17   Sanjuana KavaNwoko, Agnes I, NP    Family History Family History  Problem Relation Age of Onset  . Diabetes Mother   . Hypertension Mother   . Hyperlipidemia Mother   .  Anemia Mother   . Heart murmur Mother   . Colon polyps Maternal Uncle   . Heart disease Maternal Uncle   . Celiac disease Maternal Grandmother   . Diabetes Maternal Grandmother   . Diabetes Maternal Grandfather     Social History Social History   Tobacco Use  . Smoking status: Current Some Day Smoker    Packs/day: 1.00    Types: Cigarettes  . Smokeless tobacco: Never Used  Substance Use Topics  . Alcohol use: Yes  . Drug use: Yes    Types: Marijuana     Allergies    Patient has no known allergies.   Review of Systems Review of Systems  Constitutional: Negative for fever.  HENT: Positive for dental problem and mouth sores. Negative for sore throat and trouble swallowing.   Gastrointestinal: Negative for nausea.     Physical Exam Updated Vital Signs BP (!) 155/87 (BP Location: Right Arm)   Pulse 85   Temp 99.9 F (37.7 C) (Oral)   Resp 16   SpO2 99%   Physical Exam Vitals signs and nursing note reviewed.  Constitutional:      General: He is not in acute distress. HENT:     Mouth/Throat:     Comments: Poor overall dentition with multiple absent teeth and dental caries. There is a hard rounded growth to the had palette c/w normal anatomic variation. No visualized drainable abscess. No facial swelling. Oropharynx is benign.  Neck:     Musculoskeletal: Normal range of motion.  Pulmonary:     Effort: Pulmonary effort is normal.  Abdominal:     Tenderness: There is no abdominal tenderness.  Lymphadenopathy:     Cervical: No cervical adenopathy.  Neurological:     Mental Status: He is alert.      ED Treatments / Results  Labs (all labs ordered are listed, but only abnormal results are displayed) Labs Reviewed - No data to display  EKG None  Radiology No results found.  Procedures Procedures (including critical care time)  Medications Ordered in ED Medications  acetaminophen (TYLENOL) tablet 650 mg (650 mg Oral Given 10/09/18 0272)     Initial Impression / Assessment and Plan / ED Course  I have reviewed the triage vital signs and the nursing notes.  Pertinent labs & imaging results that were available during my care of the patient were reviewed by me and considered in my medical decision making (see chart for details).        Patient to ED with dental pain and concern for an area to hard palette that is new to him.   He is well appearing. No definite dental abscess but has generalized upper dental pain and multiple  caries. Will cover with antibiotics. Will provide dental provider resource list.   Final Clinical Impressions(s) / ED Diagnoses   Final diagnoses:  Pain, dental    ED Discharge Orders         Ordered    penicillin v potassium (VEETID) 500 MG tablet  4 times daily     10/09/18 0706           Charlann Lange, PA-C 10/09/18 5366    Ezequiel Essex, MD 10/09/18 [phone]

## 2018-10-09 NOTE — ED Notes (Signed)
C/o dental pain and swelling in the top of his  Mouth onset 7 pm last night.

## 2018-10-09 NOTE — ED Triage Notes (Signed)
Pt c/o dental/mouth pain. Also c/o neck swelling and right ear pain.

## 2018-10-19 ENCOUNTER — Telehealth: Payer: Self-pay | Admitting: Neurology

## 2018-10-19 ENCOUNTER — Encounter: Payer: Self-pay | Admitting: *Deleted

## 2018-10-19 NOTE — Telephone Encounter (Signed)
Spoke to mom and informed her I spoke to [employer] (see previous note from this am for details). No further questions. Patient is aware of his virtual appt with Dr. Delice Lesch tomorrow.

## 2018-10-19 NOTE — Telephone Encounter (Signed)
This encounter was created in error - please disregard.

## 2018-10-19 NOTE — Telephone Encounter (Signed)
Returned [employer] call (patient's mother) who stated patient has his pills are packaged by his pharmacy ([facility]) in morning/noon/afternoon/bedtime dosing packs and delivered to their home. She is concerned the Depakote was not included in last 30 day package. Mother stated I had permission to call pharmacy and inquire about this. Patient HAS been taking the 500mg  of Depakote even though reported it is not in his pill pack (as he has separate bottle from when it was increased to 1000mg  at bedtime on 6/23).  Due to patient reported side effects this med was decreased to 500mg  7/2 via phone call with this office. He has virtual visit tomorrow at 3pm to discuss - mother reminded of appt.   Update - Spoke to [place] at [facility] and she verified the Depakote was NOT in the current pill pack. She made a note to expect a script after virtual visit tomorrow for the Depakote and it will be added back to meds delivered to patient. (Again patient DOES have one to take tonight from a bottle per his mom.)

## 2018-10-19 NOTE — Telephone Encounter (Signed)
Mother left msg at after hours serv- stated he had dental surgery and is having issues with the RX. Staes someone called him and stated he did not have to change his meds. He was wanting to know if it was ok.

## 2018-10-19 NOTE — Telephone Encounter (Signed)
Noted, thanks!

## 2018-10-19 NOTE — Telephone Encounter (Signed)
Patient will need new prescription of Depakote (as opposed to a refill) after virtual visit tomorrow.

## 2018-10-20 ENCOUNTER — Encounter: Payer: Self-pay | Admitting: Neurology

## 2018-10-20 ENCOUNTER — Telehealth (INDEPENDENT_AMBULATORY_CARE_PROVIDER_SITE_OTHER): Payer: Medicare HMO | Admitting: Neurology

## 2018-10-20 ENCOUNTER — Other Ambulatory Visit: Payer: Self-pay

## 2018-10-20 VITALS — Ht 78.0 in | Wt 210.0 lb

## 2018-10-20 DIAGNOSIS — F200 Paranoid schizophrenia: Secondary | ICD-10-CM

## 2018-10-20 DIAGNOSIS — G40309 Generalized idiopathic epilepsy and epileptic syndromes, not intractable, without status epilepticus: Secondary | ICD-10-CM | POA: Diagnosis not present

## 2018-10-20 DIAGNOSIS — R69 Illness, unspecified: Secondary | ICD-10-CM | POA: Diagnosis not present

## 2018-10-20 MED ORDER — DIVALPROEX SODIUM ER 500 MG PO TB24: 500.0000 mg | ORAL_TABLET | Freq: Every day | ORAL | 3 refills | Status: DC
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
## 2018-12-01 DIAGNOSIS — Z720 Tobacco use: Secondary | ICD-10-CM | POA: Diagnosis not present

## 2018-12-01 DIAGNOSIS — F250 Schizoaffective disorder, bipolar type: Secondary | ICD-10-CM | POA: Diagnosis not present

## 2018-12-02 ENCOUNTER — Other Ambulatory Visit: Payer: Self-pay | Admitting: Adult Health

## 2018-12-02 DIAGNOSIS — K2100 Gastro-esophageal reflux disease with esophagitis, without bleeding: Secondary | ICD-10-CM

## 2018-12-24 IMAGING — CT CT ABD-PELV W/ CM
2 of 5 series · 16 of 46 positions shown, 18 images · IV contrast (iopamidol)
Comparison: 12/06/2016 right upper quadrant ultrasound. No prior
CT.

CLINICAL DATA: Lower abdominal and rectal pain for 2-3 years.

EXAM:
CT ABDOMEN AND PELVIS WITH CONTRAST
TECHNIQUE: Multidetector CT imaging of the abdomen and pelvis was performed
using the standard protocol following bolus administration of
intravenous contrast.
CONTRAST:  100mL [id number] IOPAMIDOL ([id number]) INJECTION 61%

[Series 2: axial st · axial · 0.80mm/px · z∈[-330,+90]mm · 13 of 99 slices shown, 15 images]
[im 8/99  soft-tissue]
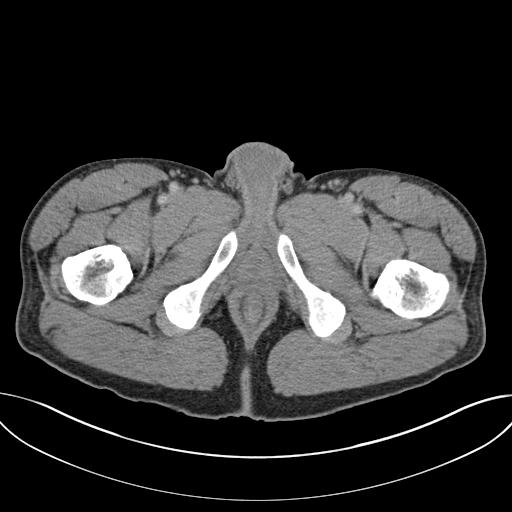
[im 8/99  bone]
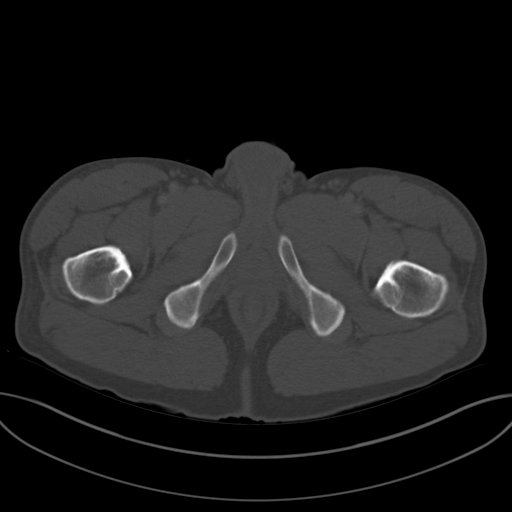
[im 15/99  soft-tissue]
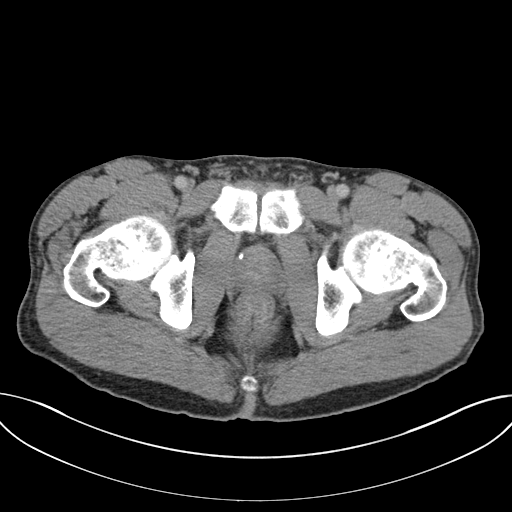
[im 22/99  soft-tissue]
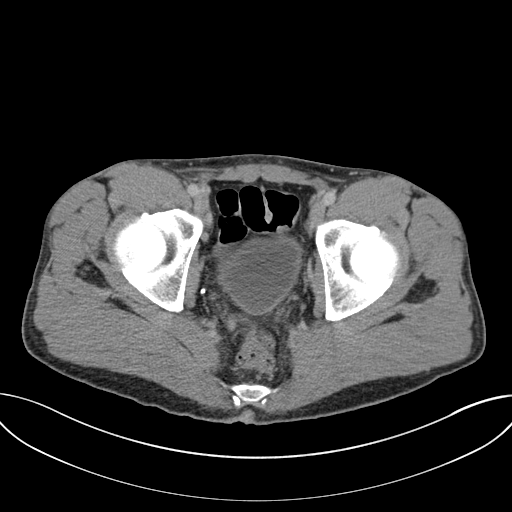
[im 29/99  soft-tissue]
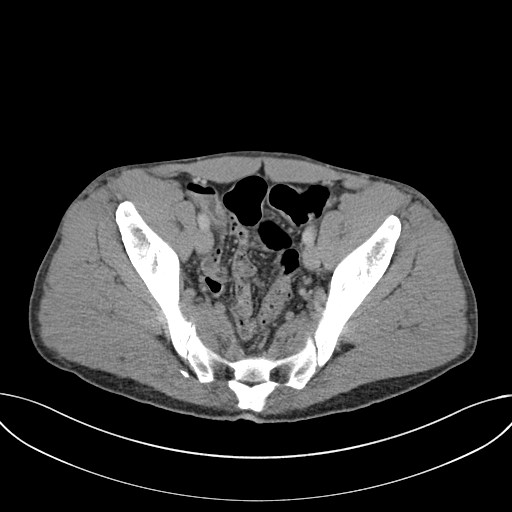
[im 36/99  soft-tissue]
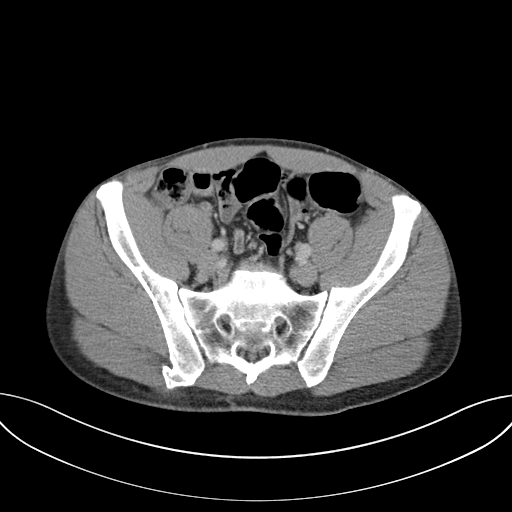
[im 43/99  soft-tissue]
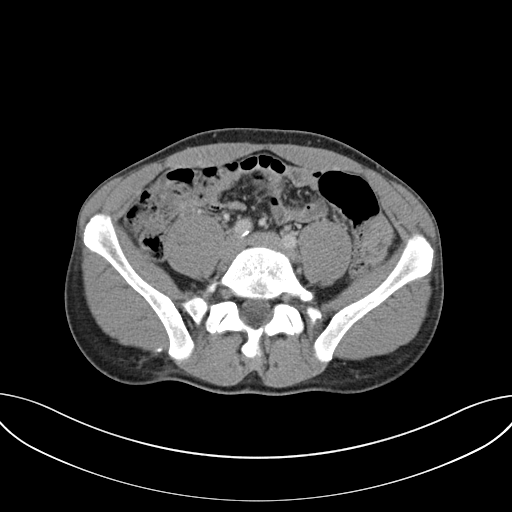
[im 50/99  soft-tissue]
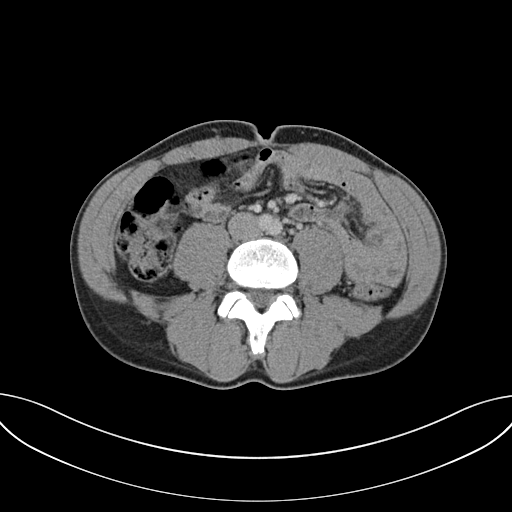
[im 57/99  soft-tissue]
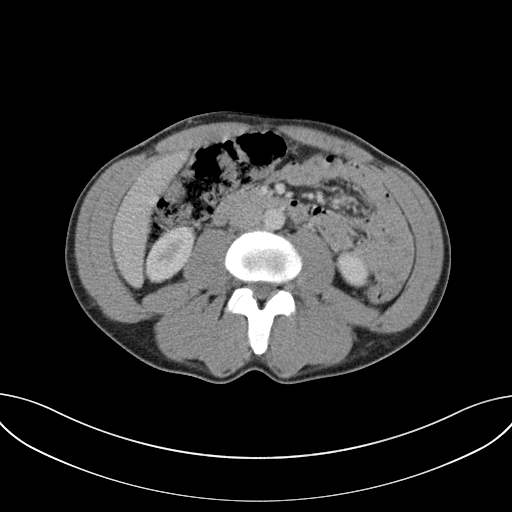
[im 64/99  soft-tissue]
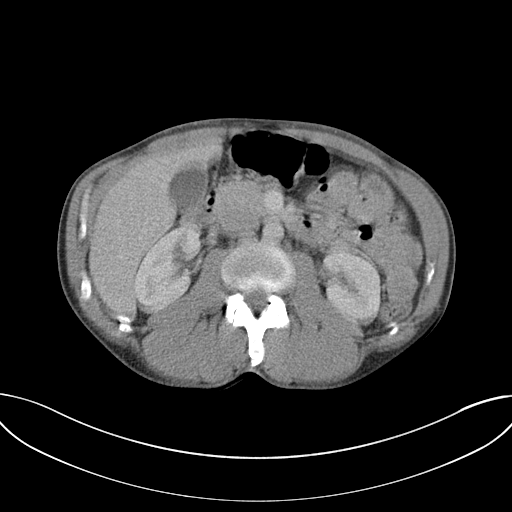
[im 64/99  bone]
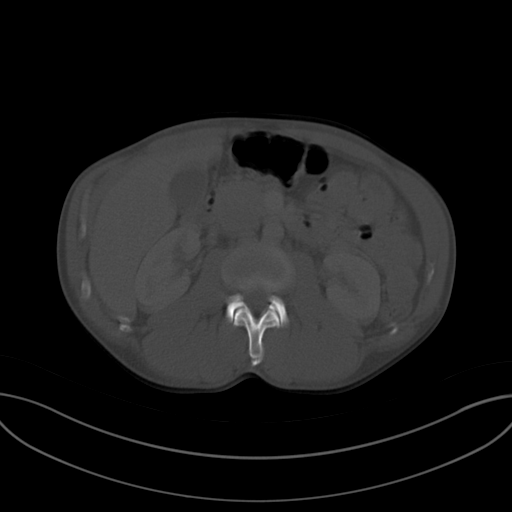
[im 71/99  soft-tissue]
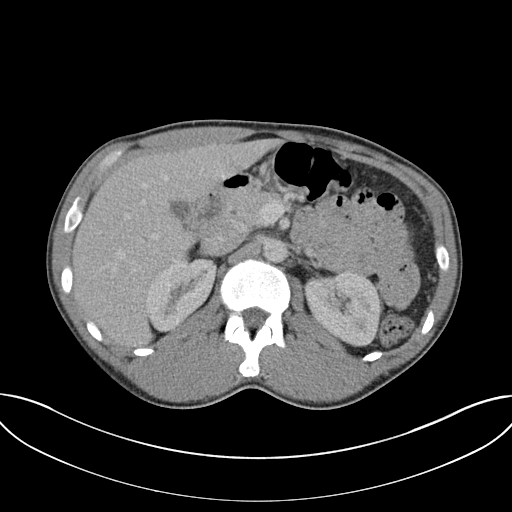
[im 78/99  soft-tissue]
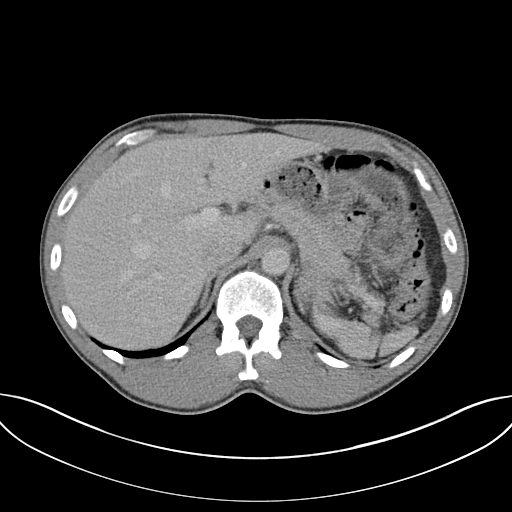
[im 85/99  soft-tissue]
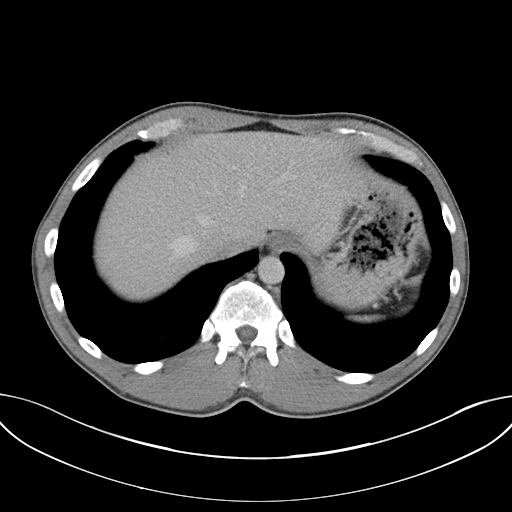
[im 92/99  soft-tissue]
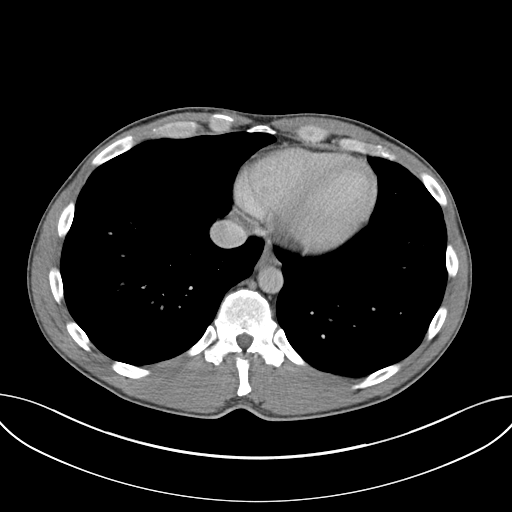

[Series 5: coronal st · coronal · 0.72mm/px · 3 of 101 slices shown]
[im 34/101  soft-tissue]
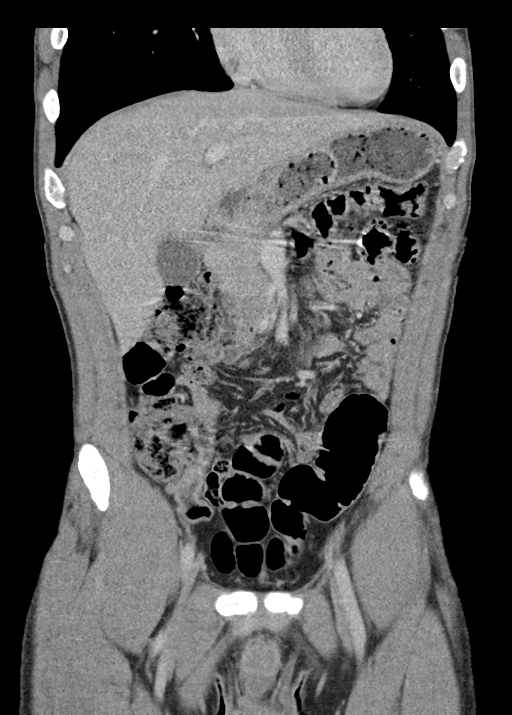
[im 45/101  soft-tissue]
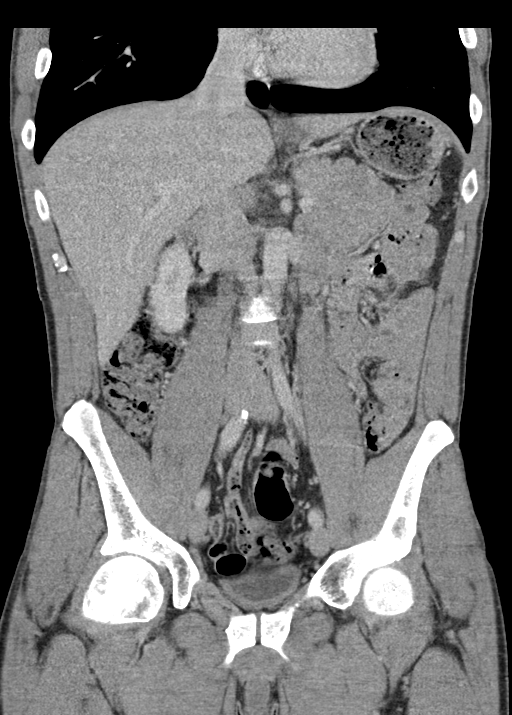
[im 56/101  soft-tissue]
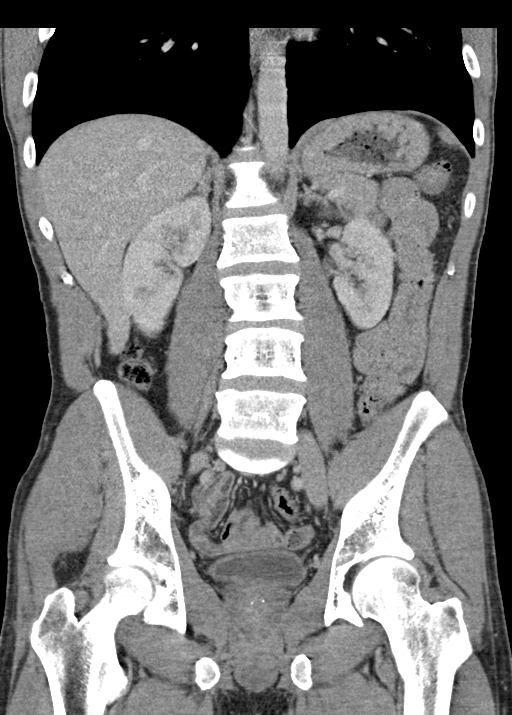

[16 of 46 positions shown; findings below may reference images not displayed]

FINDINGS: Lower chest: Clear lung bases. Normal heart size without pericardial
or pleural effusion.

Hepatobiliary: Mild motion degradation throughout the abdomen. A too
small to characterize segment 4A liver lesion. Normal gallbladder,
without biliary ductal dilatation.

Pancreas: Normal, without mass or ductal dilatation.

Spleen: Normal in size, without focal abnormality.

Adrenals/Urinary Tract: Normal adrenal glands. Normal kidneys,
without hydronephrosis. Normal urinary bladder.

Stomach/Bowel: Normal stomach, without wall thickening. Normal
colon, appendix, and terminal ileum. Normal small bowel.

Vascular/Lymphatic: Normal caliber of the aorta and branch vessels.
No abdominopelvic adenopathy.

Reproductive: Normal prostate.

Other: No significant free fluid.

Musculoskeletal: Remote trauma or surgical defect about the
posterior right iliac. Convex left lumbar spine curvature is mild.
IMPRESSION: 1. Mild motion degradation.
2.  No acute process in the abdomen or pelvis.

## 2019-02-16 ENCOUNTER — Telehealth: Payer: Medicare HMO | Admitting: Neurology

## 2019-02-16 ENCOUNTER — Other Ambulatory Visit: Payer: Self-pay

## 2019-02-16 ENCOUNTER — Telehealth: Payer: Self-pay

## 2019-02-16 NOTE — Telephone Encounter (Signed)
Pt had a virtual visit scheduled with Dr. Delice Lesch today at 8:30. Pt has been called 4x. There was not a voicemail and pt did not answer.

## 2019-02-22 DIAGNOSIS — R69 Illness, unspecified: Secondary | ICD-10-CM | POA: Diagnosis not present

## 2019-03-23 ENCOUNTER — Ambulatory Visit: Payer: Medicare HMO | Admitting: Neurology

## 2019-04-22 ENCOUNTER — Telehealth: Payer: Self-pay

## 2019-04-22 ENCOUNTER — Other Ambulatory Visit: Payer: Self-pay

## 2019-04-22 ENCOUNTER — Telehealth: Payer: Medicare Other | Admitting: Neurology

## 2019-04-22 NOTE — Telephone Encounter (Signed)
Patient scheduled for virtual visit 04/22/19 at 9am. Called patient 4 times. The phone did ring, no answer and no vmail.

## 2019-05-14 ENCOUNTER — Telehealth: Payer: Self-pay | Admitting: Adult Health

## 2019-05-14 NOTE — Telephone Encounter (Signed)
Patients mom called wanting to ask Kandee Keen some questions about the vaccination for her son.  Please advise

## 2019-05-18 NOTE — Telephone Encounter (Signed)
Called pt again no answer. Will try again another time.

## 2019-05-18 NOTE — Telephone Encounter (Signed)
Called pt no answer or vm set up.

## 2019-06-07 NOTE — Telephone Encounter (Signed)
Pt's Mother, Darl Pikes, is calling to see if he is ok to get the covid vaccine due to his medications. She is also wondering if he is due for the flu vaccine and if he needs to get that before the covid vaccine?   Darl Pikes can be reached at [phone]

## 2019-06-08 NOTE — Telephone Encounter (Signed)
He would be ok getting Covid Vaccination. I would have him do the Covid vaccination at this time. Although he is due for a flu vaccination, it is at the end of the season and likely does not need it

## 2019-06-08 NOTE — Telephone Encounter (Signed)
Spoke to [place] and informed her of below message.  Nothing further needed.

## 2019-08-05 ENCOUNTER — Encounter: Payer: Medicare Other | Admitting: Adult Health

## 2019-08-24 ENCOUNTER — Ambulatory Visit: Payer: Medicare Other | Admitting: Neurology

## 2019-09-08 ENCOUNTER — Other Ambulatory Visit: Payer: Self-pay | Admitting: Neurology

## 2019-09-09 ENCOUNTER — Ambulatory Visit (INDEPENDENT_AMBULATORY_CARE_PROVIDER_SITE_OTHER): Payer: Medicare (Managed Care) | Admitting: Neurology

## 2019-09-09 ENCOUNTER — Other Ambulatory Visit: Payer: Self-pay

## 2019-09-09 ENCOUNTER — Encounter: Payer: Self-pay | Admitting: Neurology

## 2019-09-09 VITALS — BP 124/84 | HR 92 | Ht 78.0 in | Wt 235.2 lb

## 2019-09-09 DIAGNOSIS — G40309 Generalized idiopathic epilepsy and epileptic syndromes, not intractable, without status epilepticus: Secondary | ICD-10-CM | POA: Diagnosis not present

## 2019-09-09 MED ORDER — DIVALPROEX SODIUM ER 500 MG PO TB24: ORAL_TABLET | ORAL | 3 refills | Status: DC
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
## 2019-11-01 ENCOUNTER — Other Ambulatory Visit: Payer: Self-pay | Admitting: Adult Health

## 2019-11-01 DIAGNOSIS — K2100 Gastro-esophageal reflux disease with esophagitis, without bleeding: Secondary | ICD-10-CM

## 2019-11-03 NOTE — Telephone Encounter (Signed)
CPX SCHEDULED FOR 11/04/19.  WILL HOLD

## 2019-11-04 ENCOUNTER — Other Ambulatory Visit: Payer: Self-pay

## 2019-11-04 ENCOUNTER — Encounter: Payer: Self-pay | Admitting: Adult Health

## 2019-11-04 ENCOUNTER — Ambulatory Visit (INDEPENDENT_AMBULATORY_CARE_PROVIDER_SITE_OTHER): Payer: Medicare Other | Admitting: Adult Health

## 2019-11-04 VITALS — BP 120/84 | Temp 97.8°F | Ht 77.0 in | Wt 240.0 lb

## 2019-11-04 DIAGNOSIS — K2100 Gastro-esophageal reflux disease with esophagitis, without bleeding: Secondary | ICD-10-CM | POA: Diagnosis not present

## 2019-11-04 DIAGNOSIS — F17200 Nicotine dependence, unspecified, uncomplicated: Secondary | ICD-10-CM | POA: Diagnosis not present

## 2019-11-04 DIAGNOSIS — Z125 Encounter for screening for malignant neoplasm of prostate: Secondary | ICD-10-CM

## 2019-11-04 DIAGNOSIS — F324 Major depressive disorder, single episode, in partial remission: Secondary | ICD-10-CM

## 2019-11-04 DIAGNOSIS — F200 Paranoid schizophrenia: Secondary | ICD-10-CM | POA: Diagnosis not present

## 2019-11-04 DIAGNOSIS — Z0000 Encounter for general adult medical examination without abnormal findings: Secondary | ICD-10-CM | POA: Diagnosis not present

## 2019-11-04 DIAGNOSIS — R569 Unspecified convulsions: Secondary | ICD-10-CM

## 2019-11-04 NOTE — Telephone Encounter (Signed)
Just taking Prilosec. Ok to send in for 365 days

## 2019-11-04 NOTE — Patient Instructions (Signed)
It was great seeing you today.  You look great!   We will follow up with you regarding your blood work   Please let me know if you need anything
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
## 2019-11-04 NOTE — Telephone Encounter (Signed)
Sent to the pharmacy by e-scribe. 

## 2019-11-05 LAB — COMPLETE METABOLIC PANEL WITH GFR
AG Ratio: 1.6 (calc) (ref 1.0–2.5)
ALT: 12 U/L (ref 9–46)
AST: 18 U/L (ref 10–40)
Albumin: 4.1 g/dL (ref 3.6–5.1)
Alkaline phosphatase (APISO): 55 U/L (ref 36–130)
BUN: 12 mg/dL (ref 7–25)
CO2: 26 mmol/L (ref 20–32)
Calcium: 9.1 mg/dL (ref 8.6–10.3)
Chloride: 104 mmol/L (ref 98–110)
Creat: 1.33 mg/dL (ref 0.60–1.35)
GFR, Est African American: 74 mL/min/{1.73_m2} (ref >=60)
GFR, Est Non African American: 64 mL/min/{1.73_m2} (ref >=60)
Globulin: 2.6 g/dL (calc) (ref 1.9–3.7)
Glucose, Bld: 93 mg/dL (ref 65–99)
Potassium: 4.4 mmol/L (ref 3.5–5.3)
Sodium: 139 mmol/L (ref 135–146)
Total Bilirubin: 0.5 mg/dL (ref 0.2–1.2)
Total Protein: 6.7 g/dL (ref 6.1–8.1)

## 2019-11-05 LAB — CBC WITH DIFFERENTIAL/PLATELET
Absolute Monocytes: 654 cells/uL (ref 200–950)
Basophils Absolute: 12 cells/uL (ref 0–200)
Basophils Relative: 0.2 %
Eosinophils Absolute: 108 cells/uL (ref 15–500)
Eosinophils Relative: 1.8 %
HCT: 43.4 % (ref 38.5–50.0)
Hemoglobin: 14.3 g/dL (ref 13.2–17.1)
Lymphs Abs: 3300 cells/uL (ref 850–3900)
MCH: 28.4 pg (ref 27.0–33.0)
MCHC: 32.9 g/dL (ref 32.0–36.0)
MCV: 86.1 fL (ref 80.0–100.0)
MPV: 10.4 fL (ref 7.5–12.5)
Monocytes Relative: 10.9 %
Neutro Abs: 1926 cells/uL (ref 1500–7800)
Neutrophils Relative %: 32.1 %
Platelets: 231 10*3/uL (ref 140–400)
RBC: 5.04 10*6/uL (ref 4.20–5.80)
RDW: 12.6 % (ref 11.0–15.0)
Total Lymphocyte: 55 %
WBC: 6 10*3/uL (ref 3.8–10.8)

## 2019-11-05 LAB — HEMOGLOBIN A1C
Hgb A1c MFr Bld: 5.7 % of total Hgb — ABNORMAL HIGH (ref <5.7)
Mean Plasma Glucose: 117 (calc)
eAG (mmol/L): 6.5 (calc)

## 2019-11-05 LAB — LIPID PANEL
Cholesterol: 185 mg/dL (ref <200)
HDL: 45 mg/dL (ref >=40)
LDL Cholesterol (Calc): 105 mg/dL (calc) — ABNORMAL HIGH
Non-HDL Cholesterol (Calc): 140 mg/dL (calc) — ABNORMAL HIGH (ref <130)
Total CHOL/HDL Ratio: 4.1 (calc) (ref <5.0)
Triglycerides: 234 mg/dL — ABNORMAL HIGH (ref <150)

## 2019-11-05 LAB — PSA: PSA: 0.4 ng/mL (ref <=4.0)

## 2019-11-05 LAB — TSH: TSH: 2.09 mIU/L (ref 0.40–4.50)

## 2019-11-08 ENCOUNTER — Telehealth: Payer: Self-pay | Admitting: Adult Health

## 2019-11-08 NOTE — Telephone Encounter (Signed)
Spoke to the pt and informed him of results.  Nothing further needed.

## 2019-11-08 NOTE — Telephone Encounter (Signed)
Patient states he received a call from the office and was returning the call.  He wasn't sure of a voice mail message left or not.

## 2019-11-19 ENCOUNTER — Other Ambulatory Visit: Payer: Self-pay | Admitting: Adult Health

## 2019-12-24 ENCOUNTER — Other Ambulatory Visit: Payer: Self-pay | Admitting: Neurology

## 2020-01-25 ENCOUNTER — Telehealth: Payer: Self-pay | Admitting: Adult Health

## 2020-01-25 NOTE — Telephone Encounter (Signed)
The patient wants to know if he is okay to take [facility] with out having to take the flu shot.  He doesn't want the flu shot.  Please advise

## 2020-01-27 NOTE — Telephone Encounter (Signed)
Patient informed of the message below.

## 2020-01-27 NOTE — Telephone Encounter (Signed)
That is fine 

## 2020-02-08 ENCOUNTER — Telehealth: Payer: Self-pay

## 2020-02-08 NOTE — Telephone Encounter (Signed)
Patients mom called in wanting a call back about patient,wouldn't tell me what the issue was or what the call was regarding only wanted to speak with the nurse    Please call and advise

## 2020-02-08 NOTE — Telephone Encounter (Signed)
Spoke with the pts mother and she stated the pt told her Kandee Keen said he did not have to get a flu shot.  I informed the pts mother of information from [place] from the phone call with the same info from 10/26, in which the pt was informed on 10/28.

## 2020-03-03 ENCOUNTER — Ambulatory Visit: Payer: Medicare Other | Attending: Internal Medicine

## 2020-03-03 DIAGNOSIS — Z23 Encounter for immunization: Secondary | ICD-10-CM

## 2020-03-03 NOTE — Progress Notes (Signed)
   Covid-19 Vaccination Clinic  Name:  Alfred [name]    MRN: [id number] DOB: Oct 03, 1975  03/03/2020  [name] was observed post Covid-19 immunization for 15 minutes without incident. He was provided with Vaccine Information Sheet and instruction to access the V-Safe system.   [name] was instructed to call 911 with any severe reactions post vaccine: Marland Kitchen Difficulty breathing  . Swelling of face and throat  . A fast heartbeat  . A bad rash all over body  . Dizziness and weakness   Immunizations Administered    Name Date Dose VIS Date Route   Pfizer COVID-19 Vaccine 03/03/2020  3:46 PM 0.3 mL 01/19/2020 Intramuscular   Manufacturer: [employer], [employer]   Lot: [id number]   NDC: [address]-9

## 2020-03-22 ENCOUNTER — Telehealth: Payer: Self-pay | Admitting: Adult Health

## 2020-03-22 NOTE — Telephone Encounter (Signed)
Left message for patient to call back and schedule Medicare Annual Wellness Visit (AWV) either virtually or in office.   Last AWV no information please schedule at anytime with [facility] Nurse Health Advisor 1 or 2   This should be a 45 minute visit. 

## 2020-06-12 NOTE — Progress Notes (Deleted)
Subjective:   Alfred HUGHETT is a 45 y.o. male who presents for an Initial Medicare Annual Wellness Visit.  Review of Systems    N/A        Objective:    There were no vitals filed for this visit. There is no height or weight on file to calculate BMI.  Advanced Directives 09/09/2019 10/02/2018 10/01/2018 08/14/2018 12/12/2017 12/12/2017 12/06/2016  Does Patient Have a Medical Advance Directive? No No No No No No No  Would patient like information on creating a medical advance directive? - No - Guardian declined No - Patient declined - No - Patient declined No - Patient declined No - Patient declined  Some encounter information is confidential and restricted. Go to Review Flowsheets activity to see all data.    Current Medications (verified) Outpatient Encounter Medications as of 06/13/2020  Medication Sig  . divalproex (DEPAKOTE ER) 500 MG 24 hr tablet TAKE ONE TABLET BY MOUTH AT BEDTIME FOR MOOD STABILIZATION  . famotidine (PEPCID) 20 MG tablet TAKE ONE TABLET BY MOUTH DAILY  . hydrOXYzine (ATARAX/VISTARIL) 25 MG tablet Take 1 tablet (25 mg total) by mouth 3 (three) times daily as needed for anxiety.  Marland Kitchen OLANZapine zydis (ZYPREXA) 10 MG disintegrating tablet Take 1 tablet (10 mg total) by mouth 2 (two) times daily. For mood control (Patient taking differently: Take 15 mg by mouth daily. For mood control)  . omeprazole (PRILOSEC) 40 MG capsule TAKE ONE CAPSULE BY MOUTH DAILY FOR ACID REFLUX  . polyethylene glycol (MIRALAX / GLYCOLAX) packet Take 17 g by mouth daily as needed for moderate constipation. Stop use if diarrhea starts.  . traZODone (DESYREL) 50 MG tablet Take 1 tablet (50 mg total) by mouth at bedtime as needed for sleep.   No facility-administered encounter medications on file as of 06/13/2020.    Allergies (verified) Patient has no known allergies.   History: Past Medical History:  Diagnosis Date  . Alcohol abuse   . Depression   . Hypertension   . Migraines   .  Schizophrenia (HCC)   . Seizure (HCC)    alcoho   Past Surgical History:  Procedure Laterality Date  . WISDOM TOOTH EXTRACTION     Family History  Problem Relation Age of Onset  . Diabetes Mother   . Hypertension Mother   . Hyperlipidemia Mother   . Anemia Mother   . Heart murmur Mother   . Colon polyps Maternal Uncle   . Heart disease Maternal Uncle   . Celiac disease Maternal Grandmother   . Diabetes Maternal Grandmother   . Diabetes Maternal Grandfather    Social History   Socioeconomic History  . Marital status: Single    Spouse name: Not on file  . Number of children: 3  . Years of education: Not on file  . Highest education level: Not on file  Occupational History  . Occupation: Unemployed  Tobacco Use  . Smoking status: Current Some Day Smoker    Packs/day: 1.00    Types: Cigarettes  . Smokeless tobacco: Never Used  Vaping Use  . Vaping Use: Never used  Substance and Sexual Activity  . Alcohol use: Not Currently  . Drug use: Not Currently    Types: Marijuana  . Sexual activity: Not on file  Other Topics Concern  . Not on file  Social History Narrative   Right handed    Lives alone    Social Determinants of Health   Financial Resource Strain: Not on file  Food Insecurity: Not on file  Transportation Needs: Not on file  Physical Activity: Not on file  Stress: Not on file  Social Connections: Not on file    Tobacco Counseling Ready to quit: Not Answered Counseling given: Not Answered   Clinical Intake:                 Diabetic?No          Activities of Daily Living No flowsheet data found.  Patient Care Team: Shirline Frees, NP as PCP - General (Family Medicine) Van Clines, MD as Consulting Physician (Neurology)  Indicate any recent Medical Services you may have received from other than [facility] providers in the past year (date may be approximate).     Assessment:   This is a routine wellness examination for  [facility].  Hearing/Vision screen No exam data present  Dietary issues and exercise activities discussed:    Goals    . Quit Smoking      Depression Screen PHQ 2/9 Scores 09/09/2019  PHQ - 2 Score 0    Fall Risk Fall Risk  09/09/2019 10/20/2018 08/14/2018 12/26/2017 06/13/2017  Falls in the past year? 0 0 0 No No  Number falls in past yr: 0 0 0 - -  Injury with Fall? 0 0 0 - -  Follow up - Falls evaluation completed - - -    FALL RISK PREVENTION PERTAINING TO THE HOME:  Any stairs in or around the home? {YES/NO:21197} If so, are there any without handrails? No  Home free of loose throw rugs in walkways, pet beds, electrical cords, etc? Yes  Adequate lighting in your home to reduce risk of falls? Yes   ASSISTIVE DEVICES UTILIZED TO PREVENT FALLS:  Life alert? {YES/NO:21197} Use of a cane, walker or w/c? {YES/NO:21197} Grab bars in the bathroom? {YES/NO:21197} Shower chair or bench in shower? {YES/NO:21197} Elevated toilet seat or a handicapped toilet? {YES/NO:21197}  TIMED UP AND GO:  Was the test performed? Yes .  Length of time to ambulate 10 feet: *** sec.   {Appearance of JIRC:7893810}  Cognitive Function:        Immunizations Immunization History  Administered Date(s) Administered  . PFIZER(Purple Top)SARS-COV-2 Vaccination 03/03/2020  . Pneumococcal Polysaccharide-23 12/14/2017  . Tdap 05/02/2016    TDAP status: Up to date  {Flu Vaccine status:2101806}  Pneumococcal vaccine status: Up to date  Covid-19 vaccine status: Completed vaccines  Qualifies for Shingles Vaccine? No   Zostavax completed No   Shingrix Completed?: No.    Education has been provided regarding the importance of this vaccine. Patient has been advised to call insurance company to determine out of pocket expense if they have not yet received this vaccine. Advised may also receive vaccine at local pharmacy or Health Dept. Verbalized acceptance and understanding.  Screening  Tests Health Maintenance  Topic Date Due  . Hepatitis C Screening  Never done  . INFLUENZA VACCINE  Never done  . COVID-19 Vaccine (2 - Pfizer 3-dose series) 03/24/2020  . COLONOSCOPY (Pts 45-24yrs Insurance coverage will need to be confirmed)  11/27/2021  . TETANUS/TDAP  05/02/2026  . HIV Screening  Completed  . HPV VACCINES  Aged Out    Health Maintenance  Health Maintenance Due  Topic Date Due  . Hepatitis C Screening  Never done  . INFLUENZA VACCINE  Never done  . COVID-19 Vaccine (2 - Pfizer 3-dose series) 03/24/2020    Colorectal cancer screening: Type of screening: Colonoscopy. Completed 11/28/2011. Repeat every 10  years  Lung Cancer Screening: (Low Dose CT Chest recommended if Age 19-80 years, 30 pack-year currently smoking OR have quit w/in 15years.) does not qualify.   Lung Cancer Screening Referral: N/A   Additional Screening:  Hepatitis C Screening: does qualify;   Vision Screening: Recommended annual ophthalmology exams for early detection of glaucoma and other disorders of the eye. Is the patient up to date with their annual eye exam?  {YES/NO:21197} Who is the provider or what is the name of the office in which the patient attends annual eye exams? *** If pt is not established with a provider, would they like to be referred to a provider to establish care? {YES/NO:21197}.   Dental Screening: Recommended annual dental exams for proper oral hygiene  Community Resource Referral / Chronic Care Management: CRR required this visit?  No   CCM required this visit?  No      Plan:     I have personally reviewed and noted the following in the patient's chart:   . Medical and social history . Use of alcohol, tobacco or illicit drugs  . Current medications and supplements . Functional ability and status . Nutritional status . Physical activity . Advanced directives . List of other physicians . Hospitalizations, surgeries, and ER visits in previous 12  months . Vitals . Screenings to include cognitive, depression, and falls . Referrals and appointments  In addition, I have reviewed and discussed with patient certain preventive protocols, quality metrics, and best practice recommendations. A written personalized care plan for preventive services as well as general preventive health recommendations were provided to patient.     Theodora Blow, LPN   7/35/3299   Nurse Notes: None

## 2020-06-13 ENCOUNTER — Ambulatory Visit: Payer: Medicare Other

## 2020-06-13 DIAGNOSIS — Z0000 Encounter for general adult medical examination without abnormal findings: Secondary | ICD-10-CM

## 2020-06-13 NOTE — Progress Notes (Signed)
Erroneous Encounter

## 2020-09-08 ENCOUNTER — Ambulatory Visit: Payer: Medicaid Other | Admitting: Neurology

## 2020-09-11 ENCOUNTER — Ambulatory Visit: Payer: Medicaid Other | Admitting: Neurology

## 2020-09-12 ENCOUNTER — Telehealth: Payer: Self-pay | Admitting: Adult Health

## 2020-09-12 NOTE — Telephone Encounter (Signed)
Left message for patient to call back and schedule Medicare Annual Wellness Visit (AWV) either virtually or in office.   AWV-I PER PALMETTO 07/30/12  please schedule at anytime with [facility] Nurse Health Advisor 1 or 2   This should be a 45 minute visit.

## 2020-10-16 ENCOUNTER — Telehealth: Payer: Self-pay | Admitting: Adult Health

## 2020-10-16 NOTE — Telephone Encounter (Signed)
Left message for patient to call back and schedule Medicare Annual Wellness Visit (AWV) either virtually or in office.   AWV-I per PALMETTO 04/01/09 please schedule at anytime with [facility] Nurse Health Advisor 1 or 2   This should be a 45 minute visit. 

## 2020-11-09 ENCOUNTER — Other Ambulatory Visit: Payer: Self-pay | Admitting: Adult Health

## 2020-11-09 DIAGNOSIS — K2100 Gastro-esophageal reflux disease with esophagitis, without bleeding: Secondary | ICD-10-CM

## 2020-11-14 ENCOUNTER — Telehealth: Payer: Self-pay | Admitting: Adult Health

## 2020-11-14 NOTE — Telephone Encounter (Signed)
Left message for patient to call back and schedule Medicare Annual Wellness Visit (AWV) either virtually or in office.  Left both  my jabber number [phone] and office number     AWV-I per PALMETTO 04/01/09 please schedule at anytime with [facility] Nurse Health Advisor 1 or 2   This should be a 45 minute visit.  

## 2021-01-03 ENCOUNTER — Encounter: Payer: Medicare Other | Admitting: Adult Health

## 2021-01-24 ENCOUNTER — Encounter: Payer: Medicare Other | Admitting: Adult Health

## 2021-01-25 ENCOUNTER — Telehealth: Payer: Self-pay | Admitting: Adult Health

## 2021-01-25 NOTE — Telephone Encounter (Signed)
Spoke to susanne pt mom.  She stated she thinks insurance came to home and did awv.  She is going to check with patient and call me back to let me know      AWV-I per PALMETTO 04/01/09  please schedule at anytime with [facility] Nurse Health Advisor 1 or 2   This should be a 45 minute visit.

## 2021-01-31 ENCOUNTER — Other Ambulatory Visit: Payer: Self-pay

## 2021-02-01 ENCOUNTER — Encounter: Payer: Self-pay | Admitting: Adult Health

## 2021-02-01 ENCOUNTER — Ambulatory Visit (INDEPENDENT_AMBULATORY_CARE_PROVIDER_SITE_OTHER): Payer: Medicare Other | Admitting: Adult Health

## 2021-02-01 ENCOUNTER — Telehealth: Payer: Self-pay | Admitting: Adult Health

## 2021-02-01 VITALS — BP 110/80 | HR 95 | Temp 99.0°F | Ht 77.0 in | Wt 246.0 lb

## 2021-02-01 DIAGNOSIS — F324 Major depressive disorder, single episode, in partial remission: Secondary | ICD-10-CM | POA: Diagnosis not present

## 2021-02-01 DIAGNOSIS — Z125 Encounter for screening for malignant neoplasm of prostate: Secondary | ICD-10-CM

## 2021-02-01 DIAGNOSIS — F200 Paranoid schizophrenia: Secondary | ICD-10-CM | POA: Diagnosis not present

## 2021-02-01 DIAGNOSIS — Z0000 Encounter for general adult medical examination without abnormal findings: Secondary | ICD-10-CM | POA: Diagnosis not present

## 2021-02-01 DIAGNOSIS — F17200 Nicotine dependence, unspecified, uncomplicated: Secondary | ICD-10-CM | POA: Diagnosis not present

## 2021-02-01 DIAGNOSIS — Z23 Encounter for immunization: Secondary | ICD-10-CM | POA: Diagnosis not present

## 2021-02-01 DIAGNOSIS — B350 Tinea barbae and tinea capitis: Secondary | ICD-10-CM

## 2021-02-01 DIAGNOSIS — I10 Essential (primary) hypertension: Secondary | ICD-10-CM | POA: Diagnosis not present

## 2021-02-01 DIAGNOSIS — R569 Unspecified convulsions: Secondary | ICD-10-CM

## 2021-02-01 DIAGNOSIS — Z1159 Encounter for screening for other viral diseases: Secondary | ICD-10-CM

## 2021-02-01 LAB — CBC WITH DIFFERENTIAL/PLATELET
Basophils Absolute: 0 10*3/uL (ref 0.0–0.1)
Basophils Relative: 0.2 % (ref 0.0–3.0)
Eosinophils Absolute: 0.1 10*3/uL (ref 0.0–0.7)
Eosinophils Relative: 1.3 % (ref 0.0–5.0)
HCT: 43.8 % (ref 39.0–52.0)
Hemoglobin: 14.2 g/dL (ref 13.0–17.0)
Lymphocytes Relative: 44.6 % (ref 12.0–46.0)
Lymphs Abs: 2.9 10*3/uL (ref 0.7–4.0)
MCHC: 32.5 g/dL (ref 30.0–36.0)
MCV: 84.9 fl (ref 78.0–100.0)
Monocytes Absolute: 0.7 10*3/uL (ref 0.1–1.0)
Monocytes Relative: 10.5 % (ref 3.0–12.0)
Neutro Abs: 2.8 10*3/uL (ref 1.4–7.7)
Neutrophils Relative %: 43.4 % (ref 43.0–77.0)
Platelets: 270 10*3/uL (ref 150.0–400.0)
RBC: 5.16 Mil/uL (ref 4.22–5.81)
RDW: 13 % (ref 11.5–15.5)
WBC: 6.4 10*3/uL (ref 4.0–10.5)

## 2021-02-01 LAB — LIPID PANEL
Cholesterol: 188 mg/dL (ref 0–200)
HDL: 37.3 mg/dL — ABNORMAL LOW (ref >39.00)
NonHDL: 150.54
Total CHOL/HDL Ratio: 5
Triglycerides: 304 mg/dL — ABNORMAL HIGH (ref 0.0–149.0)
VLDL: 60.8 mg/dL — ABNORMAL HIGH (ref 0.0–40.0)

## 2021-02-01 LAB — PSA: PSA: 0.57 ng/mL (ref 0.10–4.00)

## 2021-02-01 LAB — HEMOGLOBIN A1C: Hgb A1c MFr Bld: 6.2 % (ref 4.6–6.5)

## 2021-02-01 LAB — LDL CHOLESTEROL, DIRECT: Direct LDL: 80 mg/dL

## 2021-02-01 LAB — TSH: TSH: 2.02 u[IU]/mL (ref 0.35–5.50)

## 2021-02-01 MED ORDER — SELENIUM SULFIDE 2.5 % EX LOTN: 1.0000 "application " | TOPICAL_LOTION | Freq: Every day | CUTANEOUS | 12 refills | Status: DC | PRN
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
## 2021-02-01 NOTE — Patient Instructions (Signed)
It was great seeing you today   I will follow up with you regarding your labs   Please work on quitting smoking   I will see you back in one year or sooner if needed

## 2021-02-02 LAB — HEPATITIS C ANTIBODY
Hepatitis C Ab: NONREACTIVE
SIGNAL TO CUT-OFF: 0.07 (ref <1.00)

## 2021-02-02 NOTE — Telephone Encounter (Signed)
Entered in error

## 2021-02-07 ENCOUNTER — Telehealth: Payer: Self-pay | Admitting: Adult Health

## 2021-02-07 ENCOUNTER — Other Ambulatory Visit: Payer: Self-pay | Admitting: Adult Health

## 2021-02-07 MED ORDER — METFORMIN HCL 500 MG PO TABS: 500.0000 mg | ORAL_TABLET | Freq: Every day | ORAL | 3 refills | Status: DC

## 2021-02-07 MED ORDER — FENOFIBRATE 145 MG PO TABS: 145.0000 mg | ORAL_TABLET | Freq: Every day | ORAL | 3 refills | Status: DC

## 2021-02-07 NOTE — Telephone Encounter (Signed)
Mo0ther called back to see what missed call was for, I let patient know that it was for lab results for patient.    Good callback number is [phone]     Please advise

## 2021-02-07 NOTE — Telephone Encounter (Signed)
See result note.  

## 2021-02-08 ENCOUNTER — Encounter: Payer: Self-pay | Admitting: Neurology

## 2021-02-08 ENCOUNTER — Ambulatory Visit (INDEPENDENT_AMBULATORY_CARE_PROVIDER_SITE_OTHER): Payer: Medicare Other | Admitting: Neurology

## 2021-02-08 ENCOUNTER — Other Ambulatory Visit: Payer: Self-pay

## 2021-02-08 VITALS — BP 131/84 | HR 84 | Resp 18 | Ht 76.0 in | Wt 253.0 lb

## 2021-02-08 DIAGNOSIS — G40309 Generalized idiopathic epilepsy and epileptic syndromes, not intractable, without status epilepticus: Secondary | ICD-10-CM

## 2021-02-08 MED ORDER — DIVALPROEX SODIUM ER 500 MG PO TB24: ORAL_TABLET | ORAL | 3 refills | Status: DC
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
## 2021-05-07 ENCOUNTER — Ambulatory Visit (INDEPENDENT_AMBULATORY_CARE_PROVIDER_SITE_OTHER): Payer: Medicare Other

## 2021-05-07 VITALS — Ht 78.0 in | Wt 270.0 lb

## 2021-05-07 DIAGNOSIS — Z0000 Encounter for general adult medical examination without abnormal findings: Secondary | ICD-10-CM | POA: Diagnosis not present

## 2021-05-07 NOTE — Progress Notes (Signed)
I connected with Alfred [name] today by telephone and verified that I am speaking with the correct person using two identifiers. Location patient: home Location provider: work Persons participating in the virtual visit: Alfred [name], Alfred [name].   I discussed the limitations, risks, security and privacy concerns of performing an evaluation and management service by telephone and the availability of in person appointments. I also discussed with the patient that there may be a patient responsible charge related to this service. The patient expressed understanding and verbally consented to this telephonic visit.    Interactive audio and video telecommunications were attempted between this provider and patient, however failed, due to patient having technical difficulties OR patient did not have access to video capability.  We continued and completed visit with audio only.     Vital signs may be patient reported or missing.  Subjective:   Alfred [name] is a 46 y.o. male who presents for an Initial Medicare Annual Wellness Visit.  Review of Systems     Cardiac Risk Factors include: male gender;obesity (BMI >30kg/m2);smoking/ tobacco exposure     Objective:    Today's Vitals   05/07/21 1431  Weight: 270 lb (122.5 kg)  Height: 6\' 6"  (1.981 m)   Body mass index is 31.2 kg/m.  Advanced Directives 05/07/2021 02/08/2021 09/09/2019 10/02/2018 10/01/2018 08/14/2018 12/12/2017  Does Patient Have a Medical Advance Directive? No No No No No No No  Would patient like information on creating a medical advance directive? - - - No - Guardian declined No - Patient declined - No - Patient declined  Some encounter information is confidential and restricted. Go to Review Flowsheets activity to see all data.    Current Medications (verified) Outpatient Encounter Medications as of 05/07/2021  Medication Sig   divalproex (DEPAKOTE ER) 500 MG 24 hr tablet Take 1 tablet every night   famotidine  (PEPCID) 20 MG tablet Take 20 mg by mouth daily.   fenofibrate (TRICOR) 145 MG tablet Take 1 tablet (145 mg total) by mouth daily.   hydrOXYzine (ATARAX/VISTARIL) 25 MG tablet Take 1 tablet (25 mg total) by mouth 3 (three) times daily as needed for anxiety.   metFORMIN (GLUCOPHAGE) 500 MG tablet Take 1 tablet (500 mg total) by mouth daily with breakfast.   OLANZapine zydis (ZYPREXA) 10 MG disintegrating tablet Take 1 tablet (10 mg total) by mouth 2 (two) times daily. For mood control (Patient taking differently: Take 20 mg by mouth 3 (three) times daily. For mood control)   omeprazole (PRILOSEC) 40 MG capsule Take 40 mg by mouth daily.   traZODone (DESYREL) 50 MG tablet Take 1 tablet (50 mg total) by mouth at bedtime as needed for sleep.   polyethylene glycol (MIRALAX / GLYCOLAX) packet Take 17 g by mouth daily as needed for moderate constipation. Stop use if diarrhea starts. (Patient not taking: Reported on 02/08/2021)   selenium sulfide (SELSUN) 2.5 % shampoo Apply 1 application topically daily as needed for irritation. (Patient not taking: Reported on 02/08/2021)   No facility-administered encounter medications on file as of 05/07/2021.    Allergies (verified) Patient has no known allergies.   History: Past Medical History:  Diagnosis Date   Alcohol abuse    Depression    Hypertension    Migraines    Schizophrenia ([place])    Seizure ([place])    alcoho   Past Surgical History:  Procedure Laterality Date   WISDOM TOOTH EXTRACTION     Family History  Problem Relation Age  of Onset   Diabetes Mother    Hypertension Mother    Hyperlipidemia Mother    Anemia Mother    Heart murmur Mother    Colon polyps Maternal Uncle    Heart disease Maternal Uncle    Celiac disease Maternal Grandmother    Diabetes Maternal Grandmother    Diabetes Maternal Grandfather    Social History   Socioeconomic History   Marital status: Single    Spouse name: Not on file   Number of children: 3   Years  of education: Not on file   Highest education level: Not on file  Occupational History   Occupation: Unemployed  Tobacco Use   Smoking status: Some Days    Packs/day: 1.00    Types: Cigarettes   Smokeless tobacco: Never  Vaping Use   Vaping Use: Never used  Substance and Sexual Activity   Alcohol use: Yes    Comment: on the weekend   Drug use: Not Currently    Types: Marijuana   Sexual activity: Not on file  Other Topics Concern   Not on file  Social History Narrative   Right handed    Lives alone    Social Determinants of Health   Financial Resource Strain: Low Risk    Difficulty of Paying Living Expenses: Not hard at all  Food Insecurity: No Food Insecurity   Worried About Charity fundraiser in the Last Year: Never true   [place] in the Last Year: Never true  Transportation Needs: No Transportation Needs   Lack of Transportation (Medical): No   Lack of Transportation (Non-Medical): No  Physical Activity: Insufficiently Active   Days of Exercise per Week: 2 days   Minutes of Exercise per Session: 30 min  Stress: Stress Concern Present   Feeling of Stress : To some extent  Social Connections: Not on file    Tobacco Counseling Ready to quit: No Counseling given: Yes   Clinical Intake:  Pre-visit preparation completed: Yes  Pain : No/denies pain     Nutritional Status: BMI > 30  Obese Nutritional Risks: None Diabetes: No  How often do you need to have someone help you when you read instructions, pamphlets, or other written materials from your doctor or pharmacy?: 1 - Never What is the last grade level you completed in school?: some college  Diabetic? no  Interpreter Needed?: No  Information entered by :: Alfred [name]   Activities of Daily Living In your present state of health, do you have any difficulty performing the following activities: 05/07/2021  Hearing? N  Vision? Y  Comment blurry when reading  Difficulty concentrating or making  decisions? (No Data)  Comment does not know how to answer  Walking or climbing stairs? N  Dressing or bathing? N  Doing errands, shopping? N  Preparing Food and eating ? N  Using the Toilet? N  In the past six months, have you accidently leaked urine? Y  Do you have problems with loss of bowel control? N  Managing your Medications? N  Managing your Finances? N  Housekeeping or managing your Housekeeping? N  Some recent data might be hidden    Patient Care Team: Dorothyann Peng, NP as PCP - General (Family Medicine) Cameron Sprang, MD as Consulting Physician (Neurology)  Indicate any recent Medical Services you may have received from other than [facility] providers in the past year (date may be approximate).     Assessment:   This is a  routine wellness examination for [facility]  Hearing/Vision screen Vision Screening - Comments:: No regular eye exams,  Dietary issues and exercise activities discussed: Current Exercise Habits: Home exercise routine, Type of exercise: walking, Time (Minutes): 30, Frequency (Times/Week): 2, Weekly Exercise (Minutes/Week): 60   Goals Addressed             This Visit's Progress    Patient Stated       05/07/2021, wants to lose weight, wants to weigh 235 pounds       Depression Screen PHQ 2/9 Scores 05/07/2021 09/09/2019  PHQ - 2 Score 0 0    Fall Risk Fall Risk  05/07/2021 02/08/2021 09/09/2019 10/20/2018 08/14/2018  Falls in the past year? 0 0 0 0 0  Number falls in past yr: - 0 0 0 0  Injury with Fall? - 0 0 0 0  Risk for fall due to : Medication side effect - - - -  Follow up Falls evaluation completed;Education provided;Falls prevention discussed - - Falls evaluation completed -    FALL RISK PREVENTION PERTAINING TO THE HOME:  Any stairs in or around the home? No  If so, are there any without handrails? N/a Home free of loose throw rugs in walkways, pet beds, electrical cords, etc? Yes  Adequate lighting in your home to reduce risk of falls?  Yes   ASSISTIVE DEVICES UTILIZED TO PREVENT FALLS:  Life alert? No  Use of a cane, walker or w/c? No  Grab bars in the bathroom? No  Shower chair or bench in shower? No  Elevated toilet seat or a handicapped toilet? Yes   TIMED UP AND GO:  Was the test performed? No .      Cognitive Function:     6CIT Screen 05/07/2021  What Year? 0 points  What month? 0 points  What time? 0 points  Count back from 20 0 points  Months in reverse 4 points  Repeat phrase 4 points  Total Score 8    Immunizations Immunization History  Administered Date(s) Administered   Influenza,inj,Quad PF,6+ Mos 02/01/2021   PFIZER(Purple Top)SARS-COV-2 Vaccination 06/25/2019, 07/16/2019, 03/03/2020   Pneumococcal Conjugate-13 02/01/2021   Pneumococcal Polysaccharide-23 12/14/2017   Tdap 05/02/2016    TDAP status: Up to date  Flu Vaccine status: Up to date  Pneumococcal vaccine status: Up to date  Covid-19 vaccine status: Completed vaccines  Qualifies for Shingles Vaccine? No   Zostavax completed No   Shingrix Completed?: n/a  Screening Tests Health Maintenance  Topic Date Due   COVID-19 Vaccine (4 - Booster for Pfizer series) 04/28/2020   COLONOSCOPY (Pts 45-33yrs Insurance coverage will need to be confirmed)  11/27/2021   TETANUS/TDAP  05/02/2026   INFLUENZA VACCINE  Completed   Hepatitis C Screening  Completed   HIV Screening  Completed   HPV VACCINES  Aged Out    Health Maintenance  Health Maintenance Due  Topic Date Due   COVID-19 Vaccine (4 - Booster for [place] series) 04/28/2020    Colorectal cancer screening: Type of screening: Colonoscopy. Completed 11/28/2011. Repeat every 10 years  Lung Cancer Screening: (Low Dose CT Chest recommended if Age 27-80 years, 30 pack-year currently smoking OR have quit w/in 15years.) does not qualify.   Lung Cancer Screening Referral: no  Additional Screening:  Hepatitis C Screening: does qualify; Completed 02/01/2021  Vision  Screening: Recommended annual ophthalmology exams for early detection of glaucoma and other disorders of the eye. Is the patient up to date with their annual eye exam?  No  Who is the provider or what is the name of the office in which the patient attends annual eye exams? none If pt is not established with a provider, would they like to be referred to a provider to establish care? No .   Dental Screening: Recommended annual dental exams for proper oral hygiene  Community Resource Referral / Chronic Care Management: CRR required this visit?  No   CCM required this visit?  No      Plan:     I have personally reviewed and noted the following in the patients chart:   Medical and social history Use of alcohol, tobacco or illicit drugs  Current medications and supplements including opioid prescriptions. Patient is not currently taking opioid prescriptions. Functional ability and status Nutritional status Physical activity Advanced directives List of other physicians Hospitalizations, surgeries, and ER visits in previous 12 months Vitals Screenings to include cognitive, depression, and falls Referrals and appointments  In addition, I have reviewed and discussed with patient certain preventive protocols, quality metrics, and best practice recommendations. A written personalized care plan for preventive services as well as general preventive health recommendations were provided to patient.     Kellie Simmering, [name]   [id number]   Nurse Notes: none  Due to this being a virtual visit, the after visit summary with patients personalized plan was offered to patient via mail or my-chart. per request, patient was mailed a copy of AVS.

## 2021-05-07 NOTE — Patient Instructions (Signed)
[name] , Thank you for taking time to come for your Medicare Wellness Visit. I appreciate your ongoing commitment to your health goals. Please review the following plan we discussed and let me know if I can assist you in the future.   Screening recommendations/referrals: Colonoscopy: completed 11/28/2011, due 11/27/2021 Recommended yearly ophthalmology/optometry visit for glaucoma screening and checkup Recommended yearly dental visit for hygiene and checkup  Vaccinations: Influenza vaccine: completed 02/01/2021, due next flu season Pneumococcal vaccine: completed 02/01/2021 Tdap vaccine: completed 05/02/2016, due 07/31/2026 Shingles vaccine:  n/a Covid-19:  03/03/2020, 07/16/2019, 06/25/2019  Advanced directives: Advance directive discussed with you today.   Conditions/risks identified: smoking  Next appointment: Follow up in one year for your annual wellness visit   Preventive Care 40-64 Years, Male Preventive care refers to lifestyle choices and visits with your health care provider that can promote health and wellness. What does preventive care include? A yearly physical exam. This is also called an annual well check. Dental exams once or twice a year. Routine eye exams. Ask your health care provider how often you should have your eyes checked. Personal lifestyle choices, including: Daily care of your teeth and gums. Regular physical activity. Eating a healthy diet. Avoiding tobacco and drug use. Limiting alcohol use. Practicing safe sex. Taking low-dose aspirin every day starting at age 32. What happens during an annual well check? The services and screenings done by your health care provider during your annual well check will depend on your age, overall health, lifestyle risk factors, and family history of disease. Counseling  Your health care provider may ask you questions about your: Alcohol use. Tobacco use. Drug use. Emotional well-being. Home and relationship  well-being. Sexual activity. Eating habits. Work and work Statistician. Screening  You may have the following tests or measurements: Height, weight, and BMI. Blood pressure. Lipid and cholesterol levels. These may be checked every 5 years, or more frequently if you are over 76 years old. Skin check. Lung cancer screening. You may have this screening every year starting at age 68 if you have a 30-pack-year history of smoking and currently smoke or have quit within the past 15 years. Fecal occult blood test (FOBT) of the stool. You may have this test every year starting at age 28. Flexible sigmoidoscopy or colonoscopy. You may have a sigmoidoscopy every 5 years or a colonoscopy every 10 years starting at age 72. Prostate cancer screening. Recommendations will vary depending on your family history and other risks. Hepatitis C blood test. Hepatitis B blood test. Sexually transmitted disease (STD) testing. Diabetes screening. This is done by checking your blood sugar (glucose) after you have not eaten for a while (fasting). You may have this done every 1-3 years. Discuss your test results, treatment options, and if necessary, the need for more tests with your health care provider. Vaccines  Your health care provider may recommend certain vaccines, such as: Influenza vaccine. This is recommended every year. Tetanus, diphtheria, and acellular pertussis (Tdap, Td) vaccine. You may need a Td booster every 10 years. Zoster vaccine. You may need this after age 56. Pneumococcal 13-valent conjugate (PCV13) vaccine. You may need this if you have certain conditions and have not been vaccinated. Pneumococcal polysaccharide (PPSV23) vaccine. You may need one or two doses if you smoke cigarettes or if you have certain conditions. Talk to your health care provider about which screenings and vaccines you need and how often you need them. This information is not intended to replace advice given to  you by your  health care provider. Make sure you discuss any questions you have with your health care provider. Document Released: 04/14/2015 Document Revised: 12/06/2015 Document Reviewed: 01/17/2015 Elsevier Interactive Patient Education  2017 [place] Prevention in the Home Falls can cause injuries. They can happen to people of all ages. There are many things you can do to make your home safe and to help prevent falls. What can I do on the outside of my home? Regularly fix the edges of walkways and driveways and fix any cracks. Remove anything that might make you trip as you walk through a door, such as a raised step or threshold. Trim any bushes or trees on the path to your home. Use bright outdoor lighting. Clear any walking paths of anything that might make someone trip, such as rocks or tools. Regularly check to see if handrails are loose or broken. Make sure that both sides of any steps have handrails. Any raised decks and porches should have guardrails on the edges. Have any leaves, snow, or ice cleared regularly. Use sand or salt on walking paths during winter. Clean up any spills in your garage right away. This includes oil or grease spills. What can I do in the bathroom? Use night lights. Install grab bars by the toilet and in the tub and shower. Do not use towel bars as grab bars. Use non-skid mats or decals in the tub or shower. If you need to sit down in the shower, use a plastic, non-slip stool. Keep the floor dry. Clean up any water that spills on the floor as soon as it happens. Remove soap buildup in the tub or shower regularly. Attach bath mats securely with double-sided non-slip rug tape. Do not have throw rugs and other things on the floor that can make you trip. What can I do in the bedroom? Use night lights. Make sure that you have a light by your bed that is easy to reach. Do not use any sheets or blankets that are too big for your bed. They should not hang down  onto the floor. Have a firm chair that has side arms. You can use this for support while you get dressed. Do not have throw rugs and other things on the floor that can make you trip. What can I do in the kitchen? Clean up any spills right away. Avoid walking on wet floors. Keep items that you use a lot in easy-to-reach places. If you need to reach something above you, use a strong step stool that has a grab bar. Keep electrical cords out of the way. Do not use floor polish or wax that makes floors slippery. If you must use wax, use non-skid floor wax. Do not have throw rugs and other things on the floor that can make you trip. What can I do with my stairs? Do not leave any items on the stairs. Make sure that there are handrails on both sides of the stairs and use them. Fix handrails that are broken or loose. Make sure that handrails are as long as the stairways. Check any carpeting to make sure that it is firmly attached to the stairs. Fix any carpet that is loose or worn. Avoid having throw rugs at the top or bottom of the stairs. If you do have throw rugs, attach them to the floor with carpet tape. Make sure that you have a light switch at the top of the stairs and the bottom of the stairs.  If you do not have them, ask someone to add them for you. What else can I do to help prevent falls? Wear shoes that: Do not have high heels. Have rubber bottoms. Are comfortable and fit you well. Are closed at the toe. Do not wear sandals. If you use a stepladder: Make sure that it is fully opened. Do not climb a closed stepladder. Make sure that both sides of the stepladder are locked into place. Ask someone to hold it for you, if possible. Clearly mark and make sure that you can see: Any grab bars or handrails. First and last steps. Where the edge of each step is. Use tools that help you move around (mobility aids) if they are needed. These include: Canes. Walkers. Scooters. Crutches. Turn  on the lights when you go into a dark area. Replace any light bulbs as soon as they burn out. Set up your furniture so you have a clear path. Avoid moving your furniture around. If any of your floors are uneven, fix them. If there are any pets around you, be aware of where they are. Review your medicines with your doctor. Some medicines can make you feel dizzy. This can increase your chance of falling. Ask your doctor what other things that you can do to help prevent falls. This information is not intended to replace advice given to you by your health care provider. Make sure you discuss any questions you have with your health care provider. Document Released: 01/12/2009 Document Revised: 08/24/2015 Document Reviewed: 04/22/2014 Elsevier Interactive Patient Education  2017 [employer].

## 2021-09-13 ENCOUNTER — Other Ambulatory Visit: Payer: Self-pay | Admitting: Adult Health

## 2021-12-04 ENCOUNTER — Other Ambulatory Visit: Payer: Self-pay | Admitting: Adult Health

## 2022-01-07 ENCOUNTER — Other Ambulatory Visit: Payer: Self-pay | Admitting: Adult Health

## 2022-02-08 ENCOUNTER — Ambulatory Visit (INDEPENDENT_AMBULATORY_CARE_PROVIDER_SITE_OTHER): Payer: Medicare Other | Admitting: Neurology

## 2022-02-08 ENCOUNTER — Encounter: Payer: Self-pay | Admitting: Neurology

## 2022-02-08 VITALS — BP 107/74 | HR 100 | Ht 78.0 in | Wt 239.0 lb

## 2022-02-08 DIAGNOSIS — G40309 Generalized idiopathic epilepsy and epileptic syndromes, not intractable, without status epilepticus: Secondary | ICD-10-CM

## 2022-02-08 MED ORDER — DIVALPROEX SODIUM ER 500 MG PO TB24: ORAL_TABLET | ORAL | 3 refills | Status: DC
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
## 2022-02-11 ENCOUNTER — Other Ambulatory Visit: Payer: Self-pay | Admitting: Adult Health

## 2022-02-12 NOTE — Telephone Encounter (Signed)
Patient need to schedule an ov for more refills. CPE 

## 2022-02-27 ENCOUNTER — Other Ambulatory Visit: Payer: Self-pay | Admitting: Adult Health

## 2022-03-02 ENCOUNTER — Other Ambulatory Visit: Payer: Self-pay | Admitting: Adult Health

## 2022-03-13 ENCOUNTER — Other Ambulatory Visit: Payer: Self-pay | Admitting: Adult Health

## 2022-05-13 ENCOUNTER — Ambulatory Visit (INDEPENDENT_AMBULATORY_CARE_PROVIDER_SITE_OTHER): Payer: 59

## 2022-05-13 ENCOUNTER — Ambulatory Visit: Payer: Medicare Other

## 2022-05-13 VITALS — Ht 78.0 in | Wt 239.0 lb

## 2022-05-13 DIAGNOSIS — Z0000 Encounter for general adult medical examination without abnormal findings: Secondary | ICD-10-CM | POA: Diagnosis not present

## 2022-05-13 NOTE — Progress Notes (Addendum)
Subjective:   Alfred [name] is a 47 y.o. male who presents for Medicare Annual/Subsequent preventive examination.  Review of Systems    Virtual Visit via Telephone Note  I connected with  Alfred [name] on 05/13/22 at  2:00 PM EST by telephone and verified that I am speaking with the correct person using two identifiers.  Location: Patient: Home Provider: Office Persons participating in the virtual visit: patient/Nurse Health Advisor   I discussed the limitations, risks, security and privacy concerns of performing an evaluation and management service by telephone and the availability of in person appointments. The patient expressed understanding and agreed to proceed.  Interactive audio and video telecommunications were attempted between this nurse and patient, however failed, due to patient having technical difficulties OR patient did not have access to video capability.  We continued and completed visit with audio only.  Some vital signs may be absent or patient reported.   Criselda Peaches, LPN  Cardiac Risk Factors include: advanced age (>43mn, >>80women);male gender;smoking/ tobacco exposure     Objective:    Today's Vitals   05/13/22 1407  Weight: 239 lb (108.4 kg)  Height: 6' 6"$  (1.981 m)   Body mass index is 27.62 kg/m.     05/13/2022    2:16 PM 02/08/2022    9:31 AM 05/07/2021    2:40 PM 02/08/2021    8:29 AM 09/09/2019   10:06 AM 10/02/2018    8:11 AM 10/01/2018   10:17 PM  Advanced Directives  Does Patient Have a Medical Advance Directive? No No No No No No No  Would patient like information on creating a medical advance directive? No - Patient declined     No - Guardian declined No - Patient declined    Current Medications (verified) Outpatient Encounter Medications as of 05/13/2022  Medication Sig   divalproex (DEPAKOTE ER) 500 MG 24 hr tablet Take 1 tablet every night   famotidine (PEPCID) 20 MG tablet TAKE ONE TABLET BY MOUTH DAILY   fenofibrate  (TRICOR) 145 MG tablet TAKE 1 TABLET (145 MG TOTAL) BY MOUTH DAILY.   hydrOXYzine (ATARAX/VISTARIL) 25 MG tablet Take 1 tablet (25 mg total) by mouth 3 (three) times daily as needed for anxiety.   metFORMIN (GLUCOPHAGE) 500 MG tablet TAKE 1 TABLET (500 MG TOTAL) BY MOUTH DAILY WITH BREAKFAST.   OLANZapine zydis (ZYPREXA) 10 MG disintegrating tablet Take 1 tablet (10 mg total) by mouth 2 (two) times daily. For mood control (Patient taking differently: Take 20 mg by mouth 3 (three) times daily. For mood control)   omeprazole (PRILOSEC) 40 MG capsule TAKE ONE CAPSULE BY MOUTH DAILY FOR ACID REFLUX   polyethylene glycol (MIRALAX / GLYCOLAX) packet Take 17 g by mouth daily as needed for moderate constipation. Stop use if diarrhea starts.   selenium sulfide (SELSUN) 2.5 % shampoo Apply 1 application topically daily as needed for irritation.   traZODone (DESYREL) 50 MG tablet Take 1 tablet (50 mg total) by mouth at bedtime as needed for sleep.   No facility-administered encounter medications on file as of 05/13/2022.    Allergies (verified) Patient has no known allergies.   History: Past Medical History:  Diagnosis Date   Alcohol abuse    Depression    Hypertension    Migraines    Schizophrenia ([place]    Seizure ([place]    alcoho   Past Surgical History:  Procedure Laterality Date   WISDOM TOOTH EXTRACTION     Family History  Problem Relation Age of Onset   Diabetes Mother    Hypertension Mother    Hyperlipidemia Mother    Anemia Mother    Heart murmur Mother    Colon polyps Maternal Uncle    Heart disease Maternal Uncle    Celiac disease Maternal Grandmother    Diabetes Maternal Grandmother    Diabetes Maternal Grandfather    Social History   Socioeconomic History   Marital status: Single    Spouse name: Not on file   Number of children: 3   Years of education: Not on file   Highest education level: Not on file  Occupational History   Occupation: Unemployed  Tobacco Use    Smoking status: Some Days    Packs/day: 1.00    Types: Cigarettes   Smokeless tobacco: Never  Vaping Use   Vaping Use: Never used  Substance and Sexual Activity   Alcohol use: Yes    Comment: on the weekend   Drug use: Not Currently    Types: Marijuana   Sexual activity: Not on file  Other Topics Concern   Not on file  Social History Narrative   Right handed    Lives alone    Social Determinants of Health   Financial Resource Strain: Low Risk  (05/13/2022)   Overall Financial Resource Strain (CARDIA)    Difficulty of Paying Living Expenses: Not hard at all  Food Insecurity: No Food Insecurity (05/13/2022)   Hunger Vital Sign    Worried About Running Out of Food in the Last Year: Never true    Ran Out of Food in the Last Year: Never true  Transportation Needs: No Transportation Needs (05/13/2022)   PRAPARE - Hydrologist (Medical): No    Lack of Transportation (Non-Medical): No  Physical Activity: Insufficiently Active (05/13/2022)   Exercise Vital Sign    Days of Exercise per Week: 2 days    Minutes of Exercise per Session: 30 min  Stress: No Stress Concern Present (05/13/2022)   [place]    Feeling of Stress : Not at all  Social Connections: [place] (05/13/2022)   Social Connection and Isolation Panel [NHANES]    Frequency of Communication with Friends and Family: More than three times a week    Frequency of Social Gatherings with Friends and Family: More than three times a week    Attends Religious Services: More than 4 times per year    Active Member of [employer] or Organizations: Yes    Attends Music therapist: More than 4 times per year    Marital Status: Married    Tobacco Counseling Ready to quit: No Counseling given: Yes   Clinical Intake:  Pre-visit preparation completed: No  Pain : No/denies pain     BMI - recorded: 27.62 Nutritional  Status: BMI 25 -29 Overweight Nutritional Risks: None Diabetes: No  How often do you need to have someone help you when you read instructions, pamphlets, or other written materials from your doctor or pharmacy?: 1 - Never  Diabetic?  No  Interpreter Needed?: No  Information entered by :: Rolene Arbour LPN   Activities of Daily Living    05/13/2022    2:13 PM  In your present state of health, do you have any difficulty performing the following activities:  Hearing? 0  Vision? 0  Difficulty concentrating or making decisions? 0  Walking or climbing stairs? 0  Dressing or bathing?  0  Doing errands, shopping? 0  Preparing Food and eating ? N  Using the Toilet? N  In the past six months, have you accidently leaked urine? Y  Comment Followed by Urologist  Do you have problems with loss of bowel control? N  Managing your Medications? N  Managing your Finances? N  Housekeeping or managing your Housekeeping? N    Patient Care Team: Dorothyann Peng, NP as PCP - General (Family Medicine) Cameron Sprang, MD as Consulting Physician (Neurology)  Indicate any recent Medical Services you may have received from other than [facility] providers in the past year (date may be approximate).     Assessment:   This is a routine wellness examination for [facility].  Hearing/Vision screen Hearing Screening - Comments:: Denies hearing difficulties   Vision Screening - Comments:: Patient deferred  Dietary issues and exercise activities discussed: Current Exercise Habits: Home exercise routine, Type of exercise: walking, Frequency (Times/Week): 2, Intensity: Moderate, Exercise limited by: None identified   Goals Addressed               This Visit's Progress     No current goals (pt-stated)        I want to just take my medicine daily       Depression Screen    05/13/2022    2:12 PM 05/07/2021    2:42 PM 09/09/2019   10:06 AM  PHQ 2/9 Scores  PHQ - 2 Score 0 0 0    Fall Risk     05/13/2022    2:14 PM 02/08/2022    9:31 AM 05/07/2021    2:41 PM 02/08/2021    8:28 AM 09/09/2019   10:06 AM  [place] in the past year? 0 0 0 0 0  Number falls in past yr: 0 0  0 0  Injury with Fall? 0 0  0 0  Risk for fall due to : No Fall Risks  Medication side effect    Follow up Falls prevention discussed Falls evaluation completed Falls evaluation completed;Education provided;Falls prevention discussed      FALL RISK PREVENTION PERTAINING TO THE HOME:  Any stairs in or around the home? No  If so, are there any without handrails? No  Home free of loose throw rugs in walkways, pet beds, electrical cords, etc? Yes  Adequate lighting in your home to reduce risk of falls? Yes   ASSISTIVE DEVICES UTILIZED TO PREVENT FALLS:  Life alert? No  Use of a cane, walker or w/c? No  Grab bars in the bathroom? No  Shower chair or bench in shower? No  Elevated toilet seat or a handicapped toilet? No   TIMED UP AND GO:  Was the test performed? No .    Cognitive Function:        05/13/2022    2:16 PM 05/07/2021    2:44 PM  6CIT Screen  What Year? 0 points 0 points  What month? 0 points 0 points  What time? 0 points 0 points  Count back from 20 0 points 0 points  Months in reverse 0 points 4 points  Repeat phrase 0 points 4 points  Total Score 0 points 8 points    Immunizations Immunization History  Administered Date(s) Administered   Influenza,inj,Quad PF,6+ Mos 02/01/2021   PFIZER(Purple Top)SARS-COV-2 Vaccination 06/25/2019, 07/16/2019, 03/03/2020   Pneumococcal Conjugate-13 02/01/2021   Pneumococcal Polysaccharide-23 12/14/2017   Tdap 05/02/2016    TDAP status: Up to date  Flu Vaccine  status: Up to date    Covid-19 vaccine status: Completed vaccines  Qualifies for Shingles Vaccine? No   Zostavax completed No   Shingrix Completed?: No.    Education has been provided regarding the importance of this vaccine. Patient has been advised to call insurance  company to determine out of pocket expense if they have not yet received this vaccine. Advised may also receive vaccine at local pharmacy or Health Dept. Verbalized acceptance and understanding.  Screening Tests Health Maintenance  Topic Date Due   COVID-19 Vaccine (4 - 2023-24 season) 05/29/2022 (Originally 11/30/2021)   INFLUENZA VACCINE  06/30/2022 (Originally 10/30/2021)   COLONOSCOPY (Pts 45-75yr Insurance coverage will need to be confirmed)  05/14/2023 (Originally 11/27/2021)   Medicare Annual Wellness (AWV)  05/14/2023   DTaP/Tdap/Td (2 - Td or Tdap) 05/02/2026   Hepatitis C Screening  Completed   HIV Screening  Completed   HPV VACCINES  Aged Out    Health Maintenance  There are no preventive care reminders to display for this patient.   Colorectal cancer screening: Referral to GI placed Patient deferred. Pt aware the office will call re: appt.  Lung Cancer Screening: (Low Dose CT Chest recommended if Age 260-80years, 30 pack-year currently smoking OR have quit w/in 15years.) does qualify.   Lung Cancer Screening Referral: Deferred  Additional Screening:  Hepatitis C Screening: does qualify; Completed 02/01/21  Vision Screening: Recommended annual ophthalmology exams for early detection of glaucoma and other disorders of the eye. Is the patient up to date with their annual eye exam?  No  Who is the provider or what is the name of the office in which the patient attends annual eye exams? Deferred If pt is not established with a provider, would they like to be referred to a provider to establish care? No .   Dental Screening: Recommended annual dental exams for proper oral hygiene  Community Resource Referral / Chronic Care Management:  CRR required this visit?  No   CCM required this visit?  No      Plan:     I have personally reviewed and noted the following in the patient's chart:   Medical and social history Use of alcohol, tobacco or illicit drugs  Current  medications and supplements including opioid prescriptions. Patient is not currently taking opioid prescriptions. Functional ability and status Nutritional status Physical activity Advanced directives List of other physicians Hospitalizations, surgeries, and ER visits in previous 12 months Vitals Screenings to include cognitive, depression, and falls Referrals and appointments  In addition, I have reviewed and discussed with patient certain preventive protocols, quality metrics, and best practice recommendations. A written personalized care plan for preventive services as well as general preventive health recommendations were provided to patient.     BCriselda Peaches LPN   [id number]  Nurse Notes: None

## 2022-05-13 NOTE — Patient Instructions (Addendum)
[name] , Thank you for taking time to come for your Medicare Wellness Visit. I appreciate your ongoing commitment to your health goals. Please review the following plan we discussed and let me know if I can assist you in the future.   These are the goals we discussed:  Goals       No current goals (pt-stated)      I want to just take my medicine daily      Patient Stated      05/07/2021, wants to lose weight, wants to weigh 235 pounds      Quit Smoking        This is a list of the screening recommended for you and due dates:  Health Maintenance  Topic Date Due   COVID-19 Vaccine (4 - 2023-24 season) 05/29/2022*   Flu Shot  06/30/2022*   Colon Cancer Screening  05/14/2023*   Medicare Annual Wellness Visit  05/14/2023   DTaP/Tdap/Td vaccine (2 - Td or Tdap) 05/02/2026   Hepatitis C Screening: USPSTF Recommendation to screen - Ages 67-79 yo.  Completed   HIV Screening  Completed   HPV Vaccine  Aged Out  *Topic was postponed. The date shown is not the original due date.    Advanced directives: Advance directive discussed with you today. Even though you declined this today, please call our office should you change your mind, and we can give you the proper paperwork for you to fill out.   Conditions/risks identified: None  Next appointment: Follow up in one year for your annual wellness visit    Preventive Care 40-64 Years, Male Preventive care refers to lifestyle choices and visits with your health care provider that can promote health and wellness. What does preventive care include? A yearly physical exam. This is also called an annual well check. Dental exams once or twice a year. Routine eye exams. Ask your health care provider how often you should have your eyes checked. Personal lifestyle choices, including: Daily care of your teeth and gums. Regular physical activity. Eating a healthy diet. Avoiding tobacco and drug use. Limiting alcohol use. Practicing safe  sex. Taking low-dose aspirin every day starting at age 74. What happens during an annual well check? The services and screenings done by your health care provider during your annual well check will depend on your age, overall health, lifestyle risk factors, and family history of disease. Counseling  Your health care provider may ask you questions about your: Alcohol use. Tobacco use. Drug use. Emotional well-being. Home and relationship well-being. Sexual activity. Eating habits. Work and work Statistician. Screening  You may have the following tests or measurements: Height, weight, and BMI. Blood pressure. Lipid and cholesterol levels. These may be checked every 5 years, or more frequently if you are over 29 years old. Skin check. Lung cancer screening. You may have this screening every year starting at age 72 if you have a 30-pack-year history of smoking and currently smoke or have quit within the past 15 years. Fecal occult blood test (FOBT) of the stool. You may have this test every year starting at age 20. Flexible sigmoidoscopy or colonoscopy. You may have a sigmoidoscopy every 5 years or a colonoscopy every 10 years starting at age 12. Prostate cancer screening. Recommendations will vary depending on your family history and other risks. Hepatitis C blood test. Hepatitis B blood test. Sexually transmitted disease (STD) testing. Diabetes screening. This is done by checking your blood sugar (glucose) after you have not  eaten for a while (fasting). You may have this done every 1-3 years. Discuss your test results, treatment options, and if necessary, the need for more tests with your health care provider. Vaccines  Your health care provider may recommend certain vaccines, such as: Influenza vaccine. This is recommended every year. Tetanus, diphtheria, and acellular pertussis (Tdap, Td) vaccine. You may need a Td booster every 10 years. Zoster vaccine. You may need this after age  63. Pneumococcal 13-valent conjugate (PCV13) vaccine. You may need this if you have certain conditions and have not been vaccinated. Pneumococcal polysaccharide (PPSV23) vaccine. You may need one or two doses if you smoke cigarettes or if you have certain conditions. Talk to your health care provider about which screenings and vaccines you need and how often you need them. This information is not intended to replace advice given to you by your health care provider. Make sure you discuss any questions you have with your health care provider. Document Released: 04/14/2015 Document Revised: 12/06/2015 Document Reviewed: 01/17/2015 Elsevier Interactive Patient Education  2017 [place] Prevention in the Home Falls can cause injuries. They can happen to people of all ages. There are many things you can do to make your home safe and to help prevent falls. What can I do on the outside of my home? Regularly fix the edges of walkways and driveways and fix any cracks. Remove anything that might make you trip as you walk through a door, such as a raised step or threshold. Trim any bushes or trees on the path to your home. Use bright outdoor lighting. Clear any walking paths of anything that might make someone trip, such as rocks or tools. Regularly check to see if handrails are loose or broken. Make sure that both sides of any steps have handrails. Any raised decks and porches should have guardrails on the edges. Have any leaves, snow, or ice cleared regularly. Use sand or salt on walking paths during winter. Clean up any spills in your garage right away. This includes oil or grease spills. What can I do in the bathroom? Use night lights. Install grab bars by the toilet and in the tub and shower. Do not use towel bars as grab bars. Use non-skid mats or decals in the tub or shower. If you need to sit down in the shower, use a plastic, non-slip stool. Keep the floor dry. Clean up any water  that spills on the floor as soon as it happens. Remove soap buildup in the tub or shower regularly. Attach bath mats securely with double-sided non-slip rug tape. Do not have throw rugs and other things on the floor that can make you trip. What can I do in the bedroom? Use night lights. Make sure that you have a light by your bed that is easy to reach. Do not use any sheets or blankets that are too big for your bed. They should not hang down onto the floor. Have a firm chair that has side arms. You can use this for support while you get dressed. Do not have throw rugs and other things on the floor that can make you trip. What can I do in the kitchen? Clean up any spills right away. Avoid walking on wet floors. Keep items that you use a lot in easy-to-reach places. If you need to reach something above you, use a strong step stool that has a grab bar. Keep electrical cords out of the way. Do not use floor polish  or wax that makes floors slippery. If you must use wax, use non-skid floor wax. Do not have throw rugs and other things on the floor that can make you trip. What can I do with my stairs? Do not leave any items on the stairs. Make sure that there are handrails on both sides of the stairs and use them. Fix handrails that are broken or loose. Make sure that handrails are as long as the stairways. Check any carpeting to make sure that it is firmly attached to the stairs. Fix any carpet that is loose or worn. Avoid having throw rugs at the top or bottom of the stairs. If you do have throw rugs, attach them to the floor with carpet tape. Make sure that you have a light switch at the top of the stairs and the bottom of the stairs. If you do not have them, ask someone to add them for you. What else can I do to help prevent falls? Wear shoes that: Do not have high heels. Have rubber bottoms. Are comfortable and fit you well. Are closed at the toe. Do not wear sandals. If you use a  stepladder: Make sure that it is fully opened. Do not climb a closed stepladder. Make sure that both sides of the stepladder are locked into place. Ask someone to hold it for you, if possible. Clearly mark and make sure that you can see: Any grab bars or handrails. First and last steps. Where the edge of each step is. Use tools that help you move around (mobility aids) if they are needed. These include: Canes. Walkers. Scooters. Crutches. Turn on the lights when you go into a dark area. Replace any light bulbs as soon as they burn out. Set up your furniture so you have a clear path. Avoid moving your furniture around. If any of your floors are uneven, fix them. If there are any pets around you, be aware of where they are. Review your medicines with your doctor. Some medicines can make you feel dizzy. This can increase your chance of falling. Ask your doctor what other things that you can do to help prevent falls. This information is not intended to replace advice given to you by your health care provider. Make sure you discuss any questions you have with your health care provider. Document Released: 01/12/2009 Document Revised: 08/24/2015 Document Reviewed: 04/22/2014 Elsevier Interactive Patient Education  2017 [employer].

## 2022-08-12 ENCOUNTER — Other Ambulatory Visit: Payer: Self-pay | Admitting: Adult Health

## 2022-08-13 NOTE — Telephone Encounter (Signed)
Patient need to schedule a CPE ov for more refills. 

## 2022-08-15 NOTE — Telephone Encounter (Signed)
Pt notified that he needs to schedule a CPE. Pt advised that I will send in 30 days max to get him to his future appt. Pt stated he will call back to schedule.

## 2022-09-11 ENCOUNTER — Other Ambulatory Visit: Payer: Self-pay | Admitting: Adult Health

## 2022-09-23 ENCOUNTER — Other Ambulatory Visit: Payer: Self-pay | Admitting: Adult Health

## 2022-09-24 NOTE — Telephone Encounter (Signed)
Patient need to schedule an ov for more refills. 

## 2022-09-25 ENCOUNTER — Other Ambulatory Visit: Payer: Self-pay | Admitting: Adult Health

## 2022-09-26 ENCOUNTER — Encounter: Payer: Self-pay | Admitting: Adult Health

## 2022-09-26 ENCOUNTER — Ambulatory Visit (INDEPENDENT_AMBULATORY_CARE_PROVIDER_SITE_OTHER): Payer: 59 | Admitting: Adult Health

## 2022-09-26 VITALS — BP 130/80 | HR 75 | Temp 98.1°F | Ht 76.5 in | Wt 238.0 lb

## 2022-09-26 DIAGNOSIS — F324 Major depressive disorder, single episode, in partial remission: Secondary | ICD-10-CM

## 2022-09-26 DIAGNOSIS — R569 Unspecified convulsions: Secondary | ICD-10-CM

## 2022-09-26 DIAGNOSIS — Z125 Encounter for screening for malignant neoplasm of prostate: Secondary | ICD-10-CM

## 2022-09-26 DIAGNOSIS — F200 Paranoid schizophrenia: Secondary | ICD-10-CM | POA: Diagnosis not present

## 2022-09-26 DIAGNOSIS — R7303 Prediabetes: Secondary | ICD-10-CM

## 2022-09-26 DIAGNOSIS — Z0000 Encounter for general adult medical examination without abnormal findings: Secondary | ICD-10-CM | POA: Diagnosis not present

## 2022-09-26 DIAGNOSIS — F17200 Nicotine dependence, unspecified, uncomplicated: Secondary | ICD-10-CM

## 2022-09-26 DIAGNOSIS — Z1211 Encounter for screening for malignant neoplasm of colon: Secondary | ICD-10-CM

## 2022-09-26 DIAGNOSIS — E781 Pure hyperglyceridemia: Secondary | ICD-10-CM

## 2022-09-26 MED ORDER — FENOFIBRATE 145 MG PO TABS
145.0000 mg | ORAL_TABLET | Freq: Every day | ORAL | 3 refills | Status: AC
Start: 2022-09-26 — End: ?
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
## 2022-09-26 NOTE — Patient Instructions (Signed)
It was great seeing you today   We will follow up with you regarding your lab work, please make an appointment for lab work. Come to this fasting  Please let me know if you need anything

## 2022-10-12 ENCOUNTER — Other Ambulatory Visit: Payer: Self-pay | Admitting: Adult Health

## 2023-01-03 ENCOUNTER — Ambulatory Visit (INDEPENDENT_AMBULATORY_CARE_PROVIDER_SITE_OTHER): Payer: 59 | Admitting: Neurology

## 2023-01-03 ENCOUNTER — Other Ambulatory Visit: Payer: Self-pay | Admitting: Adult Health

## 2023-01-03 ENCOUNTER — Encounter: Payer: Self-pay | Admitting: Neurology

## 2023-01-03 VITALS — BP 115/80 | HR 89 | Ht 78.0 in | Wt 241.2 lb

## 2023-01-03 DIAGNOSIS — G40309 Generalized idiopathic epilepsy and epileptic syndromes, not intractable, without status epilepticus: Secondary | ICD-10-CM

## 2023-01-03 MED ORDER — DIVALPROEX SODIUM ER 500 MG PO TB24: ORAL_TABLET | ORAL | 3 refills | Status: AC
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
## 2023-01-31 ENCOUNTER — Ambulatory Visit: Payer: Medicare Other | Admitting: Neurology

## 2023-09-15 ENCOUNTER — Telehealth: Payer: Self-pay | Admitting: Adult Health

## 2023-09-15 ENCOUNTER — Other Ambulatory Visit: Payer: Self-pay | Admitting: Adult Health

## 2023-09-15 DIAGNOSIS — E781 Pure hyperglyceridemia: Secondary | ICD-10-CM

## 2023-09-15 NOTE — Telephone Encounter (Signed)
Pt needs an appt. For further refills

## 2023-09-15 NOTE — Telephone Encounter (Signed)
 Noted! PPW received and placed on providers desk.

## 2023-09-15 NOTE — Telephone Encounter (Signed)
 Copied from CRM [id number]. Topic: General - Other >> Sep 12, 2023 10:37 AM Allyne Areola wrote: Reason for CRM: [facility] is calling to verify fax number for the office to send medication recommendations to NP [employer]. >> Sep 15, 2023 11:20 AM Juluis Ok wrote: Leola Raisin with [facility] calling to verify if fax was received for patient. States document was faxed on 09/12/23. Unable to locate document, states she will refax and request that it be returned today.

## 2023-09-15 NOTE — Telephone Encounter (Signed)
 Also, called pt to schedule a CPE but no answer. If pt returns call please schedule CPE.

## 2023-09-24 ENCOUNTER — Telehealth: Payer: Self-pay

## 2023-09-24 ENCOUNTER — Ambulatory Visit: Payer: 59

## 2023-09-24 NOTE — Telephone Encounter (Signed)
Unsuccessful attempt to reach patient on preferred number listed in notes for scheduled AWV. Unable to leave message.

## 2023-10-14 ENCOUNTER — Other Ambulatory Visit: Payer: Self-pay | Admitting: Adult Health

## 2023-10-27 ENCOUNTER — Other Ambulatory Visit: Payer: Self-pay | Admitting: Adult Health

## 2023-10-27 DIAGNOSIS — E781 Pure hyperglyceridemia: Secondary | ICD-10-CM

## 2023-10-27 NOTE — Telephone Encounter (Unsigned)
 Copied from CRM [id number]. Topic: Clinical - Medication Refill >> Oct 27, 2023  1:24 PM Savanna F wrote: Medication: famotidine  (PEPCID ) 20 MG tablet metFORMIN  (GLUCOPHAGE ) 50 fenofibrate  (TRICOR ) 145 MG tablet    Has the patient contacted their pharmacy? Yes (Agent: If no, request that the patient contact the pharmacy for the refill. If patient does not wish to contact the pharmacy document the reason why and proceed with request.) (Agent: If yes, when and what did the pharmacy advise?)  This is the patient's preferred pharmacy:  [facility] - [place], [place] - [address] [place] [address] Phone: [phone] Fax: [phone]  Is this the correct pharmacy for this prescription? Yes If no, delete pharmacy and type the correct one.   Has the prescription been filled recently? No  Is the patient out of the medication? Yes  Has the patient been seen for an appointment in the last year OR does the patient have an upcoming appointment? No, I did let the pharmacy know that they will need an appt before they will refill this since it has been over a year.  Can we respond through MyChart? Yes  Agent: Please be advised that Rx refills may take up to 3 business days. We ask that you follow-up with your pharmacy.

## 2023-10-29 ENCOUNTER — Other Ambulatory Visit: Payer: Self-pay | Admitting: Adult Health

## 2023-10-29 DIAGNOSIS — E781 Pure hyperglyceridemia: Secondary | ICD-10-CM

## 2023-10-29 NOTE — Telephone Encounter (Signed)
This has been taking care of.

## 2023-10-29 NOTE — Telephone Encounter (Signed)
 Copied from CRM [id number]. Topic: Clinical - Medication Question >> Oct 29, 2023  2:06 PM Aisha D wrote: Reason for CRM: [facility] pharmacy is calling to check the status of the medication refill request for the metFORMIN  HCl 500 mg,Famotidine  20 mg, Omeprazole  40 MG, and the Fenofibrate  145 mg. They stated they need this approved today so they can close the order and provide the pt with the medications.

## 2023-11-12 ENCOUNTER — Ambulatory Visit: Payer: Self-pay | Admitting: Adult Health

## 2023-11-12 ENCOUNTER — Encounter: Payer: Self-pay | Admitting: Adult Health

## 2023-11-12 ENCOUNTER — Ambulatory Visit: Admitting: Adult Health

## 2023-11-12 VITALS — BP 120/70 | HR 97 | Temp 98.6°F | Ht 78.0 in | Wt 200.0 lb

## 2023-11-12 DIAGNOSIS — F200 Paranoid schizophrenia: Secondary | ICD-10-CM

## 2023-11-12 DIAGNOSIS — R7303 Prediabetes: Secondary | ICD-10-CM

## 2023-11-12 DIAGNOSIS — E781 Pure hyperglyceridemia: Secondary | ICD-10-CM | POA: Diagnosis not present

## 2023-11-12 DIAGNOSIS — Z125 Encounter for screening for malignant neoplasm of prostate: Secondary | ICD-10-CM | POA: Diagnosis not present

## 2023-11-12 DIAGNOSIS — F17200 Nicotine dependence, unspecified, uncomplicated: Secondary | ICD-10-CM | POA: Diagnosis not present

## 2023-11-12 DIAGNOSIS — R569 Unspecified convulsions: Secondary | ICD-10-CM

## 2023-11-12 DIAGNOSIS — Z0000 Encounter for general adult medical examination without abnormal findings: Secondary | ICD-10-CM

## 2023-11-12 LAB — TSH: TSH: 1.25 u[IU]/mL (ref 0.35–5.50)

## 2023-11-12 LAB — PSA: PSA: 0.55 ng/mL (ref 0.10–4.00)

## 2023-11-12 LAB — COMPREHENSIVE METABOLIC PANEL WITH GFR
ALT: 14 U/L (ref 0–53)
AST: 16 U/L (ref 0–37)
Albumin: 4.1 g/dL (ref 3.5–5.2)
Alkaline Phosphatase: 47 U/L (ref 39–117)
BUN: 14 mg/dL (ref 6–23)
CO2: 26 meq/L (ref 19–32)
Calcium: 8.7 mg/dL (ref 8.4–10.5)
Chloride: 101 meq/L (ref 96–112)
Creatinine, Ser: 1.1 mg/dL (ref 0.40–1.50)
GFR: 78.85 mL/min (ref >60.00)
Glucose, Bld: 113 mg/dL — ABNORMAL HIGH (ref 70–99)
Potassium: 4.3 meq/L (ref 3.5–5.1)
Sodium: 135 meq/L (ref 135–145)
Total Bilirubin: 0.2 mg/dL (ref 0.2–1.2)
Total Protein: 6.8 g/dL (ref 6.0–8.3)

## 2023-11-12 LAB — CBC
HCT: 23 % — CL (ref 39.0–52.0)
Hemoglobin: 7.1 g/dL — CL (ref 13.0–17.0)
MCHC: 30.9 g/dL (ref 30.0–36.0)
MCV: 67.9 fl — ABNORMAL LOW (ref 78.0–100.0)
Platelets: 383 K/uL (ref 150.0–400.0)
RBC: 3.39 Mil/uL — ABNORMAL LOW (ref 4.22–5.81)
RDW: 16.9 % — ABNORMAL HIGH (ref 11.5–15.5)
WBC: 4.9 K/uL (ref 4.0–10.5)

## 2023-11-12 LAB — LIPID PANEL
Cholesterol: 151 mg/dL (ref 0–200)
HDL: 48.2 mg/dL (ref >39.00)
LDL Cholesterol: 74 mg/dL (ref 0–99)
NonHDL: 103.26
Total CHOL/HDL Ratio: 3
Triglycerides: 147 mg/dL (ref 0.0–149.0)
VLDL: 29.4 mg/dL (ref 0.0–40.0)

## 2023-11-12 LAB — HEMOGLOBIN A1C: Hgb A1c MFr Bld: 6 % (ref 4.6–6.5)
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
## 2023-11-12 NOTE — Patient Instructions (Signed)
 Jony,   It was great seeing you today! I am impressed with the weight loss, keep eating healthy but remember to add some healthy proteins such as fish, chicken and [place]. Keep exercising!  We will follow up with you regarding your blood work  The one thing I want you to work on this year is cutting back on smoking   Let me know if you need anything

## 2023-11-13 ENCOUNTER — Ambulatory Visit (INDEPENDENT_AMBULATORY_CARE_PROVIDER_SITE_OTHER): Admitting: Adult Health

## 2023-11-13 VITALS — BP 120/80 | HR 101 | Temp 98.3°F | Ht 78.0 in | Wt 198.0 lb

## 2023-11-13 DIAGNOSIS — R634 Abnormal weight loss: Secondary | ICD-10-CM

## 2023-11-13 DIAGNOSIS — W57XXXA Bitten or stung by nonvenomous insect and other nonvenomous arthropods, initial encounter: Secondary | ICD-10-CM

## 2023-11-13 DIAGNOSIS — D649 Anemia, unspecified: Secondary | ICD-10-CM | POA: Diagnosis not present

## 2023-11-13 LAB — IBC + FERRITIN
Ferritin: 2.3 ng/mL — ABNORMAL LOW (ref 22.0–322.0)
Iron: 13 ug/dL — ABNORMAL LOW (ref 42–165)
Saturation Ratios: 2.5 % — ABNORMAL LOW (ref 20.0–50.0)
TIBC: 516.6 ug/dL — ABNORMAL HIGH (ref 250.0–450.0)
Transferrin: 369 mg/dL — ABNORMAL HIGH (ref 212.0–360.0)

## 2023-11-13 LAB — VITAMIN B12: Vitamin B-12: 224 pg/mL (ref 211–911)

## 2023-11-13 MED ORDER — IRON (FERROUS SULFATE) 325 (65 FE) MG PO TABS: 325.0000 mg | ORAL_TABLET | Freq: Every day | ORAL | 1 refills | Status: AC

## 2023-11-13 NOTE — Progress Notes (Addendum)
 Subjective:    Patient ID: Alfred [name], male    DOB: 1975/10/03, 48 y.o.   MRN: [id number]  HPI  48 year old male who  has a past medical history of Alcohol abuse, Depression, Hypertension, Migraines, Schizophrenia (HCC), and Seizure (HCC).  He presents to the office today for follow up. He was seen yesterday for his CPE and his lab work revealed an H&H of 7.1/23.Yesterday he reported feeling well overall but today he does report that he has been feeling fatigued for some time.  He did endorse a nearly 40 pound weight loss but this was intentional. He has not noticed any blood in his stool.   Review of Systems See HPI   Past Medical History:  Diagnosis Date   Alcohol abuse    Depression    Hypertension    Migraines    Schizophrenia (HCC)    Seizure (HCC)    alcoho    Social History   Socioeconomic History   Marital status: Single    Spouse name: Not on file   Number of children: 3   Years of education: Not on file   Highest education level: Not on file  Occupational History   Occupation: Unemployed  Tobacco Use   Smoking status: Some Days    Current packs/day: 1.00    Types: Cigarettes   Smokeless tobacco: Never  Vaping Use   Vaping status: Some Days  Substance and Sexual Activity   Alcohol use: Yes    Comment: on the weekend   Drug use: Not Currently    Types: Marijuana   Sexual activity: Not on file  Other Topics Concern   Not on file  Social History Narrative   Right handed    Lives alone    Social Drivers of Health   Financial Resource Strain: Low Risk  (05/13/2022)   Overall Financial Resource Strain (CARDIA)    Difficulty of Paying Living Expenses: Not hard at all  Food Insecurity: No Food Insecurity (05/13/2022)   Hunger Vital Sign    Worried About Running Out of Food in the Last Year: Never true    Ran Out of Food in the Last Year: Never true  Transportation Needs: No Transportation Needs (05/13/2022)   PRAPARE - Scientist, research (physical sciences) (Medical): No    Lack of Transportation (Non-Medical): No  Physical Activity: Insufficiently Active (05/13/2022)   Exercise Vital Sign    Days of Exercise per Week: 2 days    Minutes of Exercise per Session: 30 min  Stress: No Stress Concern Present (05/13/2022)   [employer] of Occupational Health - Occupational Stress Questionnaire    Feeling of Stress : Not at all  Social Connections: Socially Integrated (05/13/2022)   Social Connection and Isolation Panel    Frequency of Communication with Friends and Family: More than three times a week    Frequency of Social Gatherings with Friends and Family: More than three times a week    Attends Religious Services: More than 4 times per year    Active Member of [employer] or Organizations: Yes    Attends Banker Meetings: More than 4 times per year    Marital Status: Married  Catering manager Violence: Not At Risk (05/13/2022)   Humiliation, Afraid, Rape, and Kick questionnaire    Fear of Current or Ex-Partner: No    Emotionally Abused: No    Physically Abused: No    Sexually Abused: No    Past  Surgical History:  Procedure Laterality Date   WISDOM TOOTH EXTRACTION      Family History  Problem Relation Age of Onset   Diabetes Mother    Hypertension Mother    Hyperlipidemia Mother    Anemia Mother    Heart murmur Mother    Colon polyps Maternal Uncle    Heart disease Maternal Uncle    Celiac disease Maternal Grandmother    Diabetes Maternal Grandmother    Diabetes Maternal Grandfather     No Known Allergies  Current Outpatient Medications on File Prior to Visit  Medication Sig Dispense Refill   divalproex  (DEPAKOTE  ER) 500 MG 24 hr tablet Take 1 tablet every night 90 tablet 3   famotidine  (PEPCID ) 20 MG tablet TAKE ONE TABLET BY MOUTH DAILY 30 tablet 0   fenofibrate  (TRICOR ) 145 MG tablet TAKE ONE TABLET BY MOUTH DAILY 30 tablet 0   metFORMIN  (GLUCOPHAGE ) 500 MG tablet TAKE ONE TABLET BY MOUTH DAILY  WITH BREAKFAST 30 tablet 0   OLANZapine  zydis (ZYPREXA ) 10 MG disintegrating tablet Take 1 tablet (10 mg total) by mouth 2 (two) times daily. For mood control (Patient taking differently: Take 20 mg by mouth 3 (three) times daily. For mood control) 60 tablet 0   omeprazole  (PRILOSEC) 40 MG capsule TAKE ONE CAPSULE BY MOUTH DAILY FOR ACID REFLUX 30 capsule 0   traZODone  (DESYREL ) 50 MG tablet Take 1 tablet (50 mg total) by mouth at bedtime as needed for sleep. 30 tablet 0   No current facility-administered medications on file prior to visit.    BP 120/80   Pulse (!) 101   Temp 98.3 F (36.8 C) (Oral)   Ht 6' 6 (1.981 m)   Wt 198 lb (89.8 kg)   SpO2 96%   BMI 22.88 kg/m       Objective:   Physical Exam Vitals and nursing note reviewed.  Constitutional:      Appearance: Normal appearance.  Cardiovascular:     Rate and Rhythm: Normal rate and regular rhythm.     Pulses: Normal pulses.     Heart sounds: Normal heart sounds.  Genitourinary:    Rectum: Guaiac result negative.  Neurological:     General: No focal deficit present.     Mental Status: He is alert and oriented to person, place, and time.  Psychiatric:        Mood and Affect: Mood normal.        Behavior: Behavior normal.        Thought Content: Thought content normal.        Judgment: Judgment normal.       Assessment & Plan:  1. Anemia, unspecified type (Primary) - Hemoccult negative  - Will check iron  and B12. I am going to send in oral iron  for him to start. He will likely need referral to GI and or CT abdom/pelvis  - Can consider referral to infusion clinic as well.  - IBC + Ferritin; Future - Vitamin B12; Future - Iron , Ferrous Sulfate , 325 (65 Fe) MG TABS; Take 325 mg by mouth daily.  Dispense: 90 tablet; Refill: 1 - HIV Antibody (routine testing w rflx); Future - Hepatitis Acute Panel; Future  2. Weight loss - Encouraged more protein in his diet  - IBC + Ferritin; Future - Vitamin B12; Future -  Iron , Ferrous Sulfate , 325 (65 Fe) MG TABS; Take 325 mg by mouth daily.  Dispense: 90 tablet; Refill: 1 - HIV Antibody (routine testing w rflx); Future -  Hepatitis Acute Panel; Future  3. Bedbug bite, initial encounter - He was noted to have bedbugs on his clothing yesterday. He was encouraged to call exterminator to have his home treated. Will also notify his parents

## 2023-11-14 ENCOUNTER — Ambulatory Visit: Payer: Self-pay | Admitting: Adult Health

## 2023-11-14 ENCOUNTER — Other Ambulatory Visit: Payer: Self-pay | Admitting: Adult Health

## 2023-11-14 LAB — HIV ANTIBODY (ROUTINE TESTING W REFLEX): HIV 1&2 Ab, 4th Generation: NONREACTIVE

## 2023-11-14 LAB — HEPATITIS PANEL, ACUTE
Hep A IgM: NONREACTIVE
Hep B C IgM: NONREACTIVE
Hepatitis B Surface Ag: NONREACTIVE
Hepatitis C Ab: NONREACTIVE

## 2023-11-24 ENCOUNTER — Other Ambulatory Visit: Payer: Self-pay | Admitting: Adult Health

## 2023-11-24 DIAGNOSIS — E781 Pure hyperglyceridemia: Secondary | ICD-10-CM

## 2023-12-30 ENCOUNTER — Emergency Department (HOSPITAL_COMMUNITY): Admission: EM | Admit: 2023-12-30 | Discharge: 2023-12-30 | Disposition: A | Discharge status: Home / Self Care

## 2023-12-30 ENCOUNTER — Encounter (HOSPITAL_COMMUNITY): Payer: Self-pay

## 2023-12-30 DIAGNOSIS — K029 Dental caries, unspecified: Secondary | ICD-10-CM | POA: Insufficient documentation

## 2023-12-30 DIAGNOSIS — K0889 Other specified disorders of teeth and supporting structures: Secondary | ICD-10-CM | POA: Diagnosis present

## 2023-12-30 MED ORDER — PENICILLIN V POTASSIUM 500 MG PO TABS: 500.0000 mg | ORAL_TABLET | Freq: Four times a day (QID) | ORAL | 0 refills | Status: AC

## 2023-12-30 MED ORDER — IBUPROFEN 600 MG PO TABS: 600.0000 mg | ORAL_TABLET | Freq: Four times a day (QID) | ORAL | 0 refills | Status: AC | PRN

## 2023-12-30 NOTE — ED Provider Notes (Signed)
 [place] EMERGENCY DEPARTMENT AT [facility] Provider Note   CSN: [id number] Arrival date & time: 12/30/23  2007     Patient presents with: Dental Pain   Alfred [name] is a 48 y.o. male.   The history is provided by the patient and medical records. No language interpreter was used.  Dental Pain Associated symptoms: no fever      49 year old male with history of polysubstance use, bipolar disorder, presenting with complaint of dental pain.  For the past week patient has had pain to his left upper tooth and states that is now loose and about to fall off.  Pain is sharp throbbing worse with chewing and with temperature changes.  No fever no ear pain no trouble swallowing.  Pain not improved with home remedy.  Does not have a dentist.  Prior to Admission medications   Medication Sig Start Date End Date Taking? Authorizing Provider  divalproex  (DEPAKOTE  ER) 500 MG 24 hr tablet Take 1 tablet every night 01/03/23   Aquino, Karen M, MD  famotidine  (PEPCID ) 20 MG tablet TAKE ONE TABLET BY MOUTH DAILY 11/25/23   Nafziger, Darleene, NP  fenofibrate  (TRICOR ) 145 MG tablet TAKE ONE TABLET BY MOUTH DAILY 11/25/23   Nafziger, Darleene, NP  Iron , Ferrous Sulfate , 325 (65 Fe) MG TABS Take 325 mg by mouth daily. 11/13/23   Nafziger, Darleene, NP  metFORMIN  (GLUCOPHAGE ) 500 MG tablet TAKE ONE TABLET BY MOUTH DAILY WITH BREAKFAST 11/25/23   Nafziger, Cory, NP  OLANZapine  zydis (ZYPREXA ) 10 MG disintegrating tablet Take 1 tablet (10 mg total) by mouth 2 (two) times daily. For mood control Patient taking differently: Take 20 mg by mouth 3 (three) times daily. For mood control 12/19/17   Collene Gouge I, NP  omeprazole  (PRILOSEC) 40 MG capsule TAKE ONE CAPSULE BY MOUTH DAILY FOR ACID REFLUX 11/25/23   Nafziger, Darleene, NP  traZODone  (DESYREL ) 50 MG tablet Take 1 tablet (50 mg total) by mouth at bedtime as needed for sleep. 12/19/17   Collene Gouge LILLETTE, NP    Allergies: Patient has no known allergies.    Review of  Systems  Constitutional:  Negative for fever.  HENT:  Positive for dental problem.     Updated Vital Signs BP 130/81 (BP Location: Right Arm)   Pulse 89   Temp 98.1 F (36.7 C)   Resp 19   SpO2 100%   Physical Exam Vitals and nursing note reviewed.  Constitutional:      General: He is not in acute distress.    Appearance: He is well-developed.  HENT:     Head: Atraumatic.     Mouth/Throat:     Comments: Significant dental decay and loosening of left upper canine with tenderness to palpation but no gingival erythema or abscess noted. Eyes:     Conjunctiva/sclera: Conjunctivae normal.  Musculoskeletal:     Cervical back: Neck supple.  Skin:    Findings: No rash.  Neurological:     Mental Status: He is alert.     (all labs ordered are listed, but only abnormal results are displayed) Labs Reviewed - No data to display  EKG: None  Radiology: No results found.   Procedures   Medications Ordered in the ED - No data to display                                  Medical Decision Making Risk Prescription drug  management.   BP 130/81 (BP Location: Right Arm)   Pulse 89   Temp 98.1 F (36.7 C)   Resp 19   SpO2 100%   14:32 PM  48 year old male with history of polysubstance use, bipolar disorder, presenting with complaint of dental pain.  For the past week patient has had pain to his left upper tooth and states that is now loose and about to fall off.  Pain is sharp throbbing worse with chewing and with temperature changes.  No fever no ear pain no trouble swallowing.  Pain not improved with home remedy.  Does not have a dentist.  Exam notable for tenderness to left upper canine with significant dental decay and loosening of the tooth.  No obvious abscess amenable for drainage no facial involvement.  Patient would likely benefit from antibiotic and outpatient follow-up with dentist for further care.  Evidence imaging not considered.  Patient discharged home with  penicillin  and ibuprofen .  Dental referral given.     Final diagnoses:  Pain due to dental caries    ED Discharge Orders          Ordered    penicillin  v potassium (VEETID) 500 MG tablet  4 times daily        12/30/23 2029    ibuprofen  (ADVIL ) 600 MG tablet  Every 6 hours PRN        12/30/23 2029               Nivia Colon, PA-C 01/05/24 2317    Neysa Caron PARAS, DO 01/08/24 [phone]

## 2023-12-30 NOTE — ED Triage Notes (Signed)
 Pt complaints of dental pain, for a week, became worse today, pt unable to manage pain at home. Tooth left upper tooth is loose and partially decayed.   Pt alert and oriented, pain 10 out of 10

## 2023-12-30 NOTE — Discharge Instructions (Addendum)
 Take antibiotic and ibuprofen  for your dental pain.  Call and follow-up closely with dentist for outpatient management.

## 2024-01-06 ENCOUNTER — Ambulatory Visit: Payer: 59 | Admitting: Neurology

## 2024-01-21 ENCOUNTER — Other Ambulatory Visit: Payer: Self-pay | Admitting: Adult Health
